# Patient Record
Sex: Female | Born: 1960 | Race: White | Hispanic: No | Marital: Married | Smoking: Never smoker
Health system: Southern US, Community
[De-identification: ages and names within clinical notes are randomized; demographics above are authoritative.]

## PROBLEM LIST (undated history)

## (undated) DIAGNOSIS — C50919 Malignant neoplasm of unspecified site of unspecified female breast: Secondary | ICD-10-CM

## (undated) DIAGNOSIS — I4891 Unspecified atrial fibrillation: Secondary | ICD-10-CM

## (undated) DIAGNOSIS — K2270 Barrett's esophagus without dysplasia: Secondary | ICD-10-CM

## (undated) DIAGNOSIS — K449 Diaphragmatic hernia without obstruction or gangrene: Secondary | ICD-10-CM

## (undated) DIAGNOSIS — F419 Anxiety disorder, unspecified: Secondary | ICD-10-CM

## (undated) DIAGNOSIS — K219 Gastro-esophageal reflux disease without esophagitis: Secondary | ICD-10-CM

## (undated) DIAGNOSIS — K222 Esophageal obstruction: Secondary | ICD-10-CM

## (undated) DIAGNOSIS — C50512 Malignant neoplasm of lower-outer quadrant of left female breast: Principal | ICD-10-CM

## (undated) DIAGNOSIS — S52502A Unspecified fracture of the lower end of left radius, initial encounter for closed fracture: Secondary | ICD-10-CM

## (undated) DIAGNOSIS — R05 Cough: Secondary | ICD-10-CM

## (undated) DIAGNOSIS — C439 Malignant melanoma of skin, unspecified: Secondary | ICD-10-CM

## (undated) DIAGNOSIS — T7840XA Allergy, unspecified, initial encounter: Secondary | ICD-10-CM

## (undated) DIAGNOSIS — K5730 Diverticulosis of large intestine without perforation or abscess without bleeding: Secondary | ICD-10-CM

## (undated) DIAGNOSIS — N390 Urinary tract infection, site not specified: Secondary | ICD-10-CM

## (undated) DIAGNOSIS — Z923 Personal history of irradiation: Secondary | ICD-10-CM

## (undated) HISTORY — DX: Diaphragmatic hernia without obstruction or gangrene: K44.9

## (undated) HISTORY — DX: Malignant melanoma of skin, unspecified: C43.9

## (undated) HISTORY — DX: Malignant neoplasm of lower-outer quadrant of left female breast: C50.512

## (undated) HISTORY — DX: Gastro-esophageal reflux disease without esophagitis: K21.9

## (undated) HISTORY — DX: Diverticulosis of large intestine without perforation or abscess without bleeding: K57.30

## (undated) HISTORY — PX: CHOLECYSTECTOMY: SHX55

## (undated) HISTORY — DX: Urinary tract infection, site not specified: N39.0

## (undated) HISTORY — DX: Allergy, unspecified, initial encounter: T78.40XA

## (undated) HISTORY — DX: Esophageal obstruction: K22.2

## (undated) HISTORY — DX: Malignant neoplasm of unspecified site of unspecified female breast: C50.919

## (undated) HISTORY — PX: MELANOMA EXCISION: SHX5266

## (undated) HISTORY — DX: Barrett's esophagus without dysplasia: K22.70

---

## 1998-08-11 ENCOUNTER — Other Ambulatory Visit: Admission: RE | Admit: 1998-08-11 | Discharge: 1998-08-11 | Payer: Self-pay | Admitting: Obstetrics and Gynecology

## 1999-09-07 ENCOUNTER — Other Ambulatory Visit: Admission: RE | Admit: 1999-09-07 | Discharge: 1999-09-07 | Payer: Self-pay | Admitting: Obstetrics and Gynecology

## 2000-11-02 ENCOUNTER — Other Ambulatory Visit: Admission: RE | Admit: 2000-11-02 | Discharge: 2000-11-02 | Payer: Self-pay | Admitting: Obstetrics and Gynecology

## 2001-12-10 ENCOUNTER — Other Ambulatory Visit: Admission: RE | Admit: 2001-12-10 | Discharge: 2001-12-10 | Payer: Self-pay | Admitting: Obstetrics and Gynecology

## 2002-01-08 ENCOUNTER — Encounter: Admission: RE | Admit: 2002-01-08 | Discharge: 2002-01-08 | Payer: Self-pay | Admitting: Dermatology

## 2002-01-08 ENCOUNTER — Encounter: Payer: Self-pay | Admitting: Dermatology

## 2002-12-12 ENCOUNTER — Other Ambulatory Visit: Admission: RE | Admit: 2002-12-12 | Discharge: 2002-12-12 | Payer: Self-pay | Admitting: Obstetrics and Gynecology

## 2003-04-28 ENCOUNTER — Encounter: Admission: RE | Admit: 2003-04-28 | Discharge: 2003-04-28 | Payer: Self-pay | Admitting: Internal Medicine

## 2003-04-28 ENCOUNTER — Encounter: Payer: Self-pay | Admitting: Internal Medicine

## 2004-01-21 ENCOUNTER — Other Ambulatory Visit: Admission: RE | Admit: 2004-01-21 | Discharge: 2004-01-21 | Payer: Self-pay | Admitting: Obstetrics and Gynecology

## 2005-01-26 ENCOUNTER — Other Ambulatory Visit: Admission: RE | Admit: 2005-01-26 | Discharge: 2005-01-26 | Payer: Self-pay | Admitting: Obstetrics and Gynecology

## 2005-05-11 ENCOUNTER — Emergency Department (HOSPITAL_COMMUNITY): Admission: EM | Admit: 2005-05-11 | Discharge: 2005-05-11 | Payer: Self-pay | Admitting: Emergency Medicine

## 2005-06-02 ENCOUNTER — Ambulatory Visit: Payer: Self-pay | Admitting: Internal Medicine

## 2006-01-31 ENCOUNTER — Other Ambulatory Visit: Admission: RE | Admit: 2006-01-31 | Discharge: 2006-01-31 | Payer: Self-pay | Admitting: Obstetrics and Gynecology

## 2006-03-16 ENCOUNTER — Ambulatory Visit: Payer: Self-pay | Admitting: Internal Medicine

## 2006-11-06 DIAGNOSIS — K2270 Barrett's esophagus without dysplasia: Secondary | ICD-10-CM

## 2006-11-06 HISTORY — DX: Barrett's esophagus without dysplasia: K22.70

## 2007-02-20 ENCOUNTER — Ambulatory Visit: Payer: Self-pay | Admitting: Internal Medicine

## 2007-02-26 ENCOUNTER — Ambulatory Visit: Payer: Self-pay | Admitting: Gastroenterology

## 2007-03-06 ENCOUNTER — Encounter: Payer: Self-pay | Admitting: Gastroenterology

## 2007-03-06 ENCOUNTER — Ambulatory Visit: Payer: Self-pay | Admitting: Gastroenterology

## 2007-03-12 ENCOUNTER — Ambulatory Visit: Payer: Self-pay | Admitting: Gastroenterology

## 2007-03-25 ENCOUNTER — Ambulatory Visit: Payer: Self-pay | Admitting: Gastroenterology

## 2007-03-25 HISTORY — PX: COLONOSCOPY: SHX174

## 2007-03-26 ENCOUNTER — Encounter: Admission: RE | Admit: 2007-03-26 | Discharge: 2007-03-26 | Payer: Self-pay | Admitting: Obstetrics and Gynecology

## 2007-07-16 ENCOUNTER — Encounter: Payer: Self-pay | Admitting: Internal Medicine

## 2007-07-16 DIAGNOSIS — Z9089 Acquired absence of other organs: Secondary | ICD-10-CM | POA: Insufficient documentation

## 2007-12-13 ENCOUNTER — Ambulatory Visit: Payer: Self-pay | Admitting: Internal Medicine

## 2007-12-13 DIAGNOSIS — Z85820 Personal history of malignant melanoma of skin: Secondary | ICD-10-CM | POA: Insufficient documentation

## 2007-12-13 DIAGNOSIS — K219 Gastro-esophageal reflux disease without esophagitis: Secondary | ICD-10-CM | POA: Insufficient documentation

## 2007-12-13 DIAGNOSIS — J019 Acute sinusitis, unspecified: Secondary | ICD-10-CM | POA: Insufficient documentation

## 2007-12-13 DIAGNOSIS — J309 Allergic rhinitis, unspecified: Secondary | ICD-10-CM | POA: Insufficient documentation

## 2007-12-13 DIAGNOSIS — K5730 Diverticulosis of large intestine without perforation or abscess without bleeding: Secondary | ICD-10-CM | POA: Insufficient documentation

## 2008-03-03 ENCOUNTER — Encounter: Admission: RE | Admit: 2008-03-03 | Discharge: 2008-03-03 | Payer: Self-pay | Admitting: Obstetrics and Gynecology

## 2008-03-12 ENCOUNTER — Telehealth: Payer: Self-pay | Admitting: Gastroenterology

## 2008-04-20 DIAGNOSIS — K222 Esophageal obstruction: Secondary | ICD-10-CM | POA: Insufficient documentation

## 2008-04-20 DIAGNOSIS — K449 Diaphragmatic hernia without obstruction or gangrene: Secondary | ICD-10-CM | POA: Insufficient documentation

## 2008-04-20 DIAGNOSIS — K2270 Barrett's esophagus without dysplasia: Secondary | ICD-10-CM | POA: Insufficient documentation

## 2008-04-21 ENCOUNTER — Ambulatory Visit: Payer: Self-pay | Admitting: Gastroenterology

## 2008-05-01 ENCOUNTER — Telehealth: Payer: Self-pay | Admitting: Internal Medicine

## 2009-02-09 ENCOUNTER — Encounter (INDEPENDENT_AMBULATORY_CARE_PROVIDER_SITE_OTHER): Payer: Self-pay | Admitting: *Deleted

## 2009-03-22 ENCOUNTER — Encounter: Admission: RE | Admit: 2009-03-22 | Discharge: 2009-03-22 | Payer: Self-pay | Admitting: Obstetrics and Gynecology

## 2009-09-03 ENCOUNTER — Telehealth: Payer: Self-pay | Admitting: Internal Medicine

## 2009-11-18 ENCOUNTER — Telehealth: Payer: Self-pay | Admitting: Gastroenterology

## 2009-11-29 ENCOUNTER — Encounter (INDEPENDENT_AMBULATORY_CARE_PROVIDER_SITE_OTHER): Payer: Self-pay

## 2009-11-30 ENCOUNTER — Telehealth: Payer: Self-pay | Admitting: Gastroenterology

## 2009-11-30 ENCOUNTER — Ambulatory Visit: Payer: Self-pay | Admitting: Gastroenterology

## 2009-12-08 ENCOUNTER — Ambulatory Visit: Payer: Self-pay | Admitting: Gastroenterology

## 2009-12-17 ENCOUNTER — Encounter: Payer: Self-pay | Admitting: Internal Medicine

## 2010-01-05 ENCOUNTER — Ambulatory Visit: Payer: Self-pay | Admitting: Internal Medicine

## 2010-01-05 DIAGNOSIS — M545 Low back pain: Secondary | ICD-10-CM

## 2010-01-05 DIAGNOSIS — M542 Cervicalgia: Secondary | ICD-10-CM | POA: Insufficient documentation

## 2010-02-04 ENCOUNTER — Ambulatory Visit: Payer: Self-pay | Admitting: Internal Medicine

## 2010-04-26 ENCOUNTER — Encounter: Admission: RE | Admit: 2010-04-26 | Discharge: 2010-04-26 | Payer: Self-pay | Admitting: Obstetrics and Gynecology

## 2010-12-06 NOTE — Progress Notes (Signed)
Summary: Schedule Colonoscopy   Phone Note Outgoing Call Call back at Home Phone [phone]   Call placed by: Harlow Mares CMA Duncan Dull),  November 18, 2009 2:17 PM Call placed to: Patient Summary of Call: Left message on patients machine to call back. patient needs a direct EGD to follow up on her Barretts.  Initial call taken by: Harlow Mares CMA Duncan Dull),  November 18, 2009 2:18 PM  Follow-up for Phone Call        patient scheduled for 12/08/2009 Follow-up by: Harlow Mares CMA Duncan Dull),  November 23, 2009 3:29 PM

## 2010-12-06 NOTE — Letter (Signed)
Summary: [facility]  [facility]   Imported By: Sherian Rein 01/24/2010 08:26:31  _____________________________________________________________________  External Attachment:    Type:   Image     Comment:   External Document
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
## 2010-12-06 NOTE — Progress Notes (Signed)
Summary: Pantroprazole   Phone Note Call from Patient Call back at Home Phone [phone] Call back at [phone] (cell)   Caller: Patient Call For: Dr. Jarold Motto Reason for Call: Refill Medication Summary of Call: needs refill of Pantoprazole 40mg ... [facility] [address] Initial call taken by: Vallarie Mare,  November 30, 2009 9:03 AM    Prescriptions: PROTONIX 40 MG  TBEC (PANTOPRAZOLE SODIUM) take on tab daily  #31 Tablet x 11   Entered by:   Ashok Cordia RN   Authorized by:   Mardella Layman MD [facility]   Signed by:   Ashok Cordia RN on 11/30/2009   Method used:   [facility]ronically to        [facility] [address] [id number]       [address]       [place], [place]  [address]       Ph: [phone] or [phone]       Fax: [phone]   RxID:   [id number]

## 2010-12-06 NOTE — Letter (Signed)
Summary: Recall Endoscopy Letter  [facility]  [address] [place], [place] [address]   Phone: [phone]  Fax: [phone]      February 09, 2009 MRN: [id number]   [facility] [address] [place], [place]  [address]   Dear Ms. Deblanc,   According to your medical record, it is time for you to schedule an Endoscopy. Endoscopic screening is recommended for patients with certain upper digestive tract conditions because of associated increased risk for cancers of the upper digestive system.  This letter has been generated based on the recommendations made at the time of your prior procedure. If you feel that in your particular situation this may no longer apply, please contact our office.  Please call our office at [phone]) to schedule this appointment or to update your records at your earliest convenience.  Thank you for cooperating with [place] to provide you with the very best care possible.   Sincerely,  Vania Rea. Jarold Motto, M.D.  [facility] [phone]

## 2010-12-06 NOTE — Miscellaneous (Signed)
Summary: Lec previsit  Clinical Lists Changes  Observations: Added new observation of ALLERGY REV: Done (11/30/2009 8:31)

## 2010-12-06 NOTE — Progress Notes (Signed)
Summary: REFILL  Medications Added PROTONIX 40 MG  TBEC (PANTOPRAZOLE SODIUM) take on tab daily       Phone Note Call from Patient Call back at Home Phone [phone]   Caller: Patient Reason for Call: Refill Medication Summary of Call: Wanda Moore PT DOWN TO 1WK SUPPLY OF PROTONIX.  HAS AN APPT SCHEDULED FOR 04-09-08 FOR MED RENEWAL, BUT NEEDS A REFILL [facility] UNTIL THEN.  PATIENT'S CHART HAS BEEN REQUESTED Initial call taken by: Magdalen Spatz [facility],  Mar 12, 2008 10:05 AM  Follow-up for Phone Call        RX sent for Protonix and [facility] to notify pt. Follow-up by: Harlow Mares CMA,  Mar 12, 2008 10:29 AM    New/Updated Medications: PROTONIX 40 MG  TBEC (PANTOPRAZOLE SODIUM) take on tab daily   Prescriptions: PROTONIX 40 MG  TBEC (PANTOPRAZOLE SODIUM) take on tab daily  #30 x 11   Entered by:   Harlow Mares CMA   Authorized by:   Mardella Layman MD   Signed by:   Harlow Mares CMA on 03/12/2008   Method used:   [facility] ...       [facility] [address]  [facility]       [address]       [place], [place]  [address]       Ph: [phone] or [phone]       Fax: [phone]   RxID:   [id number]

## 2010-12-06 NOTE — Letter (Signed)
Summary: EGD Instructions  [facility]  [address] [place], [place] [address]   Phone: [phone]  Fax: [phone]       ALEXYSS BALZARINI    1960-12-06    MRN: [id number]       Procedure Day Dorna Bloom: Wednesday 12/08/2009     Arrival Time:  9:00 am     Procedure Time: 10:00 am     Location of Procedure:                    _ x _ [facility] (4th Floor)   PREPARATION FOR ENDOSCOPY   On  Wednesday 2/2 THE DAY OF THE PROCEDURE:  1.   No solid foods, milk or milk products are allowed after midnight the night before your procedure.  2.   Do not drink anything colored red or purple.  Avoid juices with pulp.  No orange juice.  3.  You may drink clear liquids until 8:00 am, which is 2 hours before your procedure.                                                                                                CLEAR LIQUIDS INCLUDE: Water Jello Ice Popsicles Tea (sugar ok, no milk/cream) Powdered fruit flavored drinks Coffee (sugar ok, no milk/cream) Gatorade Juice: apple, white grape, white cranberry  Lemonade Clear bullion, consomm, broth Carbonated beverages (any kind) Strained chicken noodle soup Hard Candy   MEDICATION INSTRUCTIONS  Unless otherwise instructed, you should take regular prescription medications with a small sip of water as early as possible the morning of your procedure.             OTHER INSTRUCTIONS  You will need a responsible adult at least 51 years of age to accompany you and drive you home.   This person must remain in the waiting room during your procedure.  Wear loose fitting clothing that is easily removed.  Leave jewelry and other valuables at home.  However, you may wish to bring a book to read or an iPod/MP3 player to listen to music as you wait for your procedure to start.  Remove all body piercing jewelry and leave at home.  Total time from sign-in until discharge is approximately 2-3 hours.  You should  go home directly after your procedure and rest.  You can resume normal activities the day after your procedure.  The day of your procedure you should not:   Drive   Make legal decisions   Operate machinery   Drink alcohol   Return to work  You will receive specific instructions about eating, activities and medications before you leave.    The above instructions have been reviewed and explained to me by   Ulis Rias RN  November 30, 2009 8:51 AM     I fully understand and can verbalize these instructions _____________________________ Date _________

## 2010-12-06 NOTE — Progress Notes (Signed)
Summary: Swelling ankles/legs  Phone Note Call from Patient Call back at Work Phone [phone] Call back at [phone]   Caller: Patient Summary of Call: Patient continues to have swelling in ankles and legs.  Wants call-back. Initial call taken by: Leonette Monarch Site Manager,  May 01, 2008 10:10 AM  Follow-up for Phone Call        patient not seen since Feb '08. No h/o heart or renal disease. Reports many weeks of intermittent LE edema which improves overnight and recurrs during the day. NO SOB or cardiac symptoms.  Plan: elevate legs, use knee high or panty hose support stockings. referred to "[employer]" to research venous insufficiency. Lab: Comp panel, CBC, TSH-patient to come by. Follow-up by: Jacques Navy MD,  May 01, 2008 6:58 PM

## 2010-12-06 NOTE — Procedures (Signed)
Summary: Upper Endoscopy  Patient: Wanda Moore Note: All result statuses are Final unless otherwise noted.  Tests: (1) Upper Endoscopy (EGD)   EGD Upper Endoscopy       DONE     [facility]     [address]     [place], [place]  [address]           ENDOSCOPY PROCEDURE REPORT           PATIENT:  Wanda Moore, Wanda Moore  MR#:  [id number]     BIRTHDATE:  1960/11/10, 48 yrs. old  GENDER:  female           ENDOSCOPIST:  Vania Rea. Jarold Motto, MD, [facility]     Referred by:           PROCEDURE DATE:  12/08/2009     PROCEDURE:  EGD, diagnostic     ASA CLASS:  Class I     INDICATIONS:  Barrett's Esophagus, suspected           MEDICATIONS:   Fentanyl 75 mcg IV, Versed 8 mg IV     TOPICAL ANESTHETIC:  Exactacain Spray           DESCRIPTION OF PROCEDURE:   After the risks benefits and     alternatives of the procedure were thoroughly explained, informed     consent was obtained.  The LB GIF-H180 [id number] endoscope was     introduced through the mouth and advanced to the second portion of     the duodenum, without limitations.  The instrument was slowly     withdrawn as the mucosa was fully examined.     <<PROCEDUREIMAGES>>           A hiatal hernia was found. 3 cm. prolapsing HH noted.  Normal GE     junction was noted.  Normal duodenal folds were noted.  The     stomach was entered and closely examined. The antrum, angularis,     and lesser curvature were well visualized, including a retroflexed     view of the cardia and fundus. The stomach wall was normally     distensable. The scope passed easily through the pylorus into the     duodenum.    Retroflexed views revealed a hiatal hernia.    The     scope was then withdrawn from the patient and the procedure     completed.           COMPLICATIONS:  None           ENDOSCOPIC IMPRESSION:     1) Hiatal hernia     2) Normal GE junction     3) Normal duodenal folds     4) Normal stomach     5) A hiatal hernia     NO BARRETT'S  NOTED.NARROW BAND IMAGING DONE.DR.JACOBS REVIEWED     AND AGREES.CHRONIC UNDER GOOD CONTROL WITH DAILY PPI RX.     RECOMMENDATIONS:     1) continue PPI     F/U AS NEEDED.           REPEAT EXAM:  No           ______________________________     Vania Rea. Jarold Motto, MD, [name]           CC:  Wanda Navy, MD           n.     Wanda DoctorMarland Kitchen   Vania Rea. Brittinie Moore at 12/08/2009 10:14 AM  Wanda Moore, Wanda Moore, [id number]  Note: An exclamation mark (!) indicates a result that was not dispersed into the flowsheet. Document Creation Date: 12/08/2009 10:15 AM _______________________________________________________________________  (1) Order result status: Final Collection or observation date-time: 12/08/2009 10:04 Requested date-time:  Receipt date-time:  Reported date-time:  Referring Physician:   Ordering Physician: Sheryn Bison [id number]) Specimen Source:  Source: Wanda Moore Order Number: [id number] Lab site:

## 2011-03-03 ENCOUNTER — Other Ambulatory Visit: Payer: Self-pay | Admitting: Gastroenterology

## 2011-03-09 ENCOUNTER — Other Ambulatory Visit: Payer: Self-pay | Admitting: Gastroenterology

## 2011-03-09 MED ORDER — PANTOPRAZOLE SODIUM 40 MG PO TBEC: DELAYED_RELEASE_TABLET | ORAL | Status: DC

## 2011-03-09 NOTE — Telephone Encounter (Signed)
rx sent pt must keep office visit.  

## 2011-03-24 NOTE — Assessment & Plan Note (Signed)
[facility]                         GASTROENTEROLOGY OFFICE NOTE   NAME:Wanda Moore, Wanda Moore                     MRN:          [id number]  DATE:02/26/2007                            DOB:          [birth year]    REFERRING PHYSICIAN:  Rosalyn Gess. 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 Debby Bud for evaluation of dysphagia.   HISTORY OF PRESENT ILLNESS:  Wanda Moore has had intermittent solid food  dysphagia in her mid substernal area for the last 2 years usually with  large pieces of bread or meats. She has some positional changes that  will allow her to have the food pass and alleviate some associated  shortness of breath. She has never had true heartburn but has been on  Protonix on and off for the last couple of years. She says if she eats  late at night she will have some regurgitation symptoms. She has never  had endoscopy or barium studies of her bowels but has had ERCP some 16  years ago for retained common duct stone before a cholecystectomy.   With her dysphagia, she has had no anorexia, weight loss or any  hepatobiliary complaints. Her bowels move well and she denies melena or  hematochezia.   PAST MEDICAL HISTORY:  Otherwise fairly noncontributory except for a  cholecystectomy in 1992. She did have a cesarean section also in 1992.   MEDICATIONS:  None besides her Protonix. She has no drug allergies.   FAMILY HISTORY:  Remarkable for breast cancer in her sister, otherwise  noncontributory.   SOCIAL HISTORY:  She is married and lives with her husband and children.  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 She does not smoke or abuse alcohol.   REVIEW OF SYSTEMS:  Noncontributory. Her last menstrual period was February 14, 2007.  It is of note she currently apparently has a urinary tract  infection and is on Cipro for  this.   PHYSICAL EXAMINATION:  GENERAL:  Shows her to be an attractive,  pleasant, white female in no distress appearing her stated age.  VITAL SIGNS:  She is 6 feet tall and weighs 243 pounds. Blood pressure  is 122/80 and pulse was 72 and regular.  HEENT:  I could not appreciate stigmata of chronic liver disease. She  did have a palpable non-nodular thyroid.  CHEST:  Clear without wheezes or rhonchi.  HEART:  She appeared to be in a regular rhythm without significant  cardiac murmurs, gallops or rubs.  ABDOMEN:  There was no hepatosplenomegaly, abdominal masses or  tenderness noted.  EXTREMITIES:  Peripheral extremities were unremarkable.  NEUROLOGIC:  Mental status was normal.  RECTAL:  Deferred at this time.   ASSESSMENT:  1. Probable chronic acid reflux with peptic stricture esophagus versus      Schatzki's ring in her distal esophagus. I think her sensation of      food being Moore in her upper airway is referred from her lower      esophageal area.  2. Status post cholecystectomy and endoscopic sphincterotomy.  3. History of melanoma apparently removed from her back by Dr. Terri Piedra.      I do not have these records for review.  4. History of recurrent urinary tract infections.   RECOMMENDATIONS:  1. Continue reflux regime and Protonix therapy.  2. Outpatient endoscopy, probable esophageal dilatation. The risks and      benefits of this procedure has been explained in detail and she has      agreed to proceed as planned.  3. Would recommend colonoscopy exam especially for her age and history      of malignant melanoma.  4. Continue other medications and followup with Dr. Debby Bud as      previously planned.     Vania Rea. Jarold Motto, MD, Caleen Essex, FAGA  Electronically Signed    DRP/MedQ  DD: 02/26/2007  DT: 02/26/2007  Job #: [id number]   cc:   Rosalyn Gess. Norins, MD

## 2011-03-31 ENCOUNTER — Other Ambulatory Visit (INDEPENDENT_AMBULATORY_CARE_PROVIDER_SITE_OTHER): Payer: BC Managed Care – PPO

## 2011-03-31 ENCOUNTER — Encounter: Payer: Self-pay | Admitting: Gastroenterology

## 2011-03-31 ENCOUNTER — Ambulatory Visit (INDEPENDENT_AMBULATORY_CARE_PROVIDER_SITE_OTHER): Payer: BC Managed Care – PPO | Admitting: Gastroenterology

## 2011-03-31 VITALS — BP 130/78 | HR 76 | Ht 72.0 in | Wt 247.4 lb

## 2011-03-31 DIAGNOSIS — K219 Gastro-esophageal reflux disease without esophagitis: Secondary | ICD-10-CM

## 2011-03-31 DIAGNOSIS — K2270 Barrett's esophagus without dysplasia: Secondary | ICD-10-CM

## 2011-03-31 LAB — MAGNESIUM: Magnesium: 2.1 mg/dL (ref 1.5–2.5)

## 2011-03-31 LAB — HEPATIC FUNCTION PANEL
ALT: 32 U/L (ref 0–35)
AST: 26 U/L (ref 0–37)
Albumin: 3.6 g/dL (ref 3.5–5.2)
Alkaline Phosphatase: 62 U/L (ref 39–117)
Bilirubin, Direct: 0.1 mg/dL (ref 0.0–0.3)
Total Bilirubin: 0.4 mg/dL (ref 0.3–1.2)
Total Protein: 6.7 g/dL (ref 6.0–8.3)

## 2011-03-31 LAB — BASIC METABOLIC PANEL
BUN: 11 mg/dL (ref 6–23)
CO2: 28 mEq/L (ref 19–32)
Calcium: 9.1 mg/dL (ref 8.4–10.5)
Chloride: 105 mEq/L (ref 96–112)
Creatinine, Ser: 0.7 mg/dL (ref 0.4–1.2)
GFR: 97.03 mL/min (ref >60.00)
Glucose, Bld: 79 mg/dL (ref 70–99)
Potassium: 4.2 mEq/L (ref 3.5–5.1)
Sodium: 139 mEq/L (ref 135–145)

## 2011-03-31 LAB — CBC WITH DIFFERENTIAL/PLATELET
Basophils Absolute: 0 10*3/uL (ref 0.0–0.1)
Basophils Relative: 0.5 % (ref 0.0–3.0)
Eosinophils Absolute: 0.3 10*3/uL (ref 0.0–0.7)
Eosinophils Relative: 3.7 % (ref 0.0–5.0)
HCT: 34 % — ABNORMAL LOW (ref 36.0–46.0)
Hemoglobin: 11.9 g/dL — ABNORMAL LOW (ref 12.0–15.0)
Lymphocytes Relative: 36.8 % (ref 12.0–46.0)
Lymphs Abs: 2.6 10*3/uL (ref 0.7–4.0)
MCHC: 35.1 g/dL (ref 30.0–36.0)
MCV: 84.7 fl (ref 78.0–100.0)
Monocytes Absolute: 0.5 10*3/uL (ref 0.1–1.0)
Monocytes Relative: 7.3 % (ref 3.0–12.0)
Neutro Abs: 3.7 10*3/uL (ref 1.4–7.7)
Neutrophils Relative %: 51.7 % (ref 43.0–77.0)
Platelets: 234 10*3/uL (ref 150.0–400.0)
RBC: 4.02 Mil/uL (ref 3.87–5.11)
RDW: 12.8 % (ref 11.5–14.6)
WBC: 7.1 10*3/uL (ref 4.5–10.5)

## 2011-03-31 LAB — FOLATE: Folate: >24.8 ng/mL (ref >5.9)

## 2011-03-31 LAB — IBC PANEL
Iron: 74 ug/dL (ref 42–145)
Saturation Ratios: 15.4 % — ABNORMAL LOW (ref 20.0–50.0)
Transferrin: 343 mg/dL (ref 212.0–360.0)

## 2011-03-31 LAB — FERRITIN: Ferritin: 20.4 ng/mL (ref 10.0–291.0)

## 2011-03-31 LAB — TSH: TSH: 1.05 u[IU]/mL (ref 0.35–5.50)

## 2011-03-31 LAB — VITAMIN B12: Vitamin B-12: 338 pg/mL (ref 211–911)

## 2011-03-31 MED ORDER — PANTOPRAZOLE SODIUM 40 MG PO TBEC: 40.0000 mg | DELAYED_RELEASE_TABLET | Freq: Every day | ORAL | Status: DC

## 2011-03-31 NOTE — Progress Notes (Signed)
This is a very nice 50 year old Caucasian female with chronic acid reflux doing well on daily PPi therapy. She does have intermittent solid food dysphagia every several weeks, but had endoscopy approximately one year ago and was fairly unremarkable. There is no history of lower gastrointestinal complaints, and she had colonoscopy 5 years ago. There is no history of any food intolerances, or general medical problems except for postmenopausal symptoms and she is on daily hormonal therapy per her gynecologist.  Current Medications, Allergies, Past Medical History, Past Surgical History, Family History and Social History were reviewed in [name] record.  Pertinent Review of Systems Negative   Physical Exam: Awake and alert no acute distress appearing her stated age. There no stigmata of chronic liver disease, thyromegaly, or lymphadenopathy. Her chest is clear the cardiac exam is unremarkable with a normal rhythm. Abdominal exam shows no organomegaly, masses or tenderness. Bowel sounds are normal and peripheral extremities are unremarkable. Mental status is normal and there are no gross neurological deficits noted.    Assessment and Plan: Chronic gastroesophageal reflux disease doing well on daily Protonix 40 mg. I reviewed reflux regime with her, and will check screening labs not done a while. I advised her to take Caltrate 600 with vitamin D 2 tablets a day, and we will see her on a yearly basis. Review over last endoscopy showed no evidence of Barrett's mucosa. No diagnosis found.

## 2011-03-31 NOTE — Patient Instructions (Addendum)
Your prescription(s) have been sent to you pharmacy.  Please go to the basement today for your labs.  Start Caltrate 600 with vit d take one tablet by mouth twice a day you can buy this OTC.

## 2011-06-01 ENCOUNTER — Other Ambulatory Visit: Payer: Self-pay | Admitting: Obstetrics and Gynecology

## 2011-06-01 DIAGNOSIS — Z1231 Encounter for screening mammogram for malignant neoplasm of breast: Secondary | ICD-10-CM

## 2011-06-14 ENCOUNTER — Ambulatory Visit
Admission: RE | Admit: 2011-06-14 | Discharge: 2011-06-14 | Disposition: A | Discharge status: Home / Self Care | Payer: BC Managed Care – PPO | Source: Ambulatory Visit | Attending: Obstetrics and Gynecology | Admitting: Obstetrics and Gynecology

## 2011-06-14 DIAGNOSIS — Z1231 Encounter for screening mammogram for malignant neoplasm of breast: Secondary | ICD-10-CM

## 2011-06-20 ENCOUNTER — Other Ambulatory Visit: Payer: Self-pay | Admitting: Obstetrics and Gynecology

## 2011-06-20 DIAGNOSIS — R928 Other abnormal and inconclusive findings on diagnostic imaging of breast: Secondary | ICD-10-CM

## 2011-06-26 ENCOUNTER — Ambulatory Visit
Admission: RE | Admit: 2011-06-26 | Discharge: 2011-06-26 | Disposition: A | Discharge status: Home / Self Care | Payer: BC Managed Care – PPO | Source: Ambulatory Visit | Attending: Obstetrics and Gynecology | Admitting: Obstetrics and Gynecology

## 2011-06-26 DIAGNOSIS — R928 Other abnormal and inconclusive findings on diagnostic imaging of breast: Secondary | ICD-10-CM

## 2011-07-19 ENCOUNTER — Telehealth: Payer: Self-pay | Admitting: *Deleted

## 2011-07-19 NOTE — Telephone Encounter (Signed)
Pt advised to go to ER or UC ASAP for chest pains and mild tingling in LT arm. Pt agreed and will go now.

## 2011-07-19 NOTE — Telephone Encounter (Signed)
Pt left VM on Triage A this am c/o "catching" chest pains. No answer, left pt VM To call office and speak w/someone ASAP.

## 2011-10-04 ENCOUNTER — Other Ambulatory Visit: Payer: Self-pay | Admitting: Obstetrics and Gynecology

## 2011-11-07 DIAGNOSIS — K449 Diaphragmatic hernia without obstruction or gangrene: Secondary | ICD-10-CM

## 2011-11-07 DIAGNOSIS — K222 Esophageal obstruction: Secondary | ICD-10-CM

## 2011-11-07 HISTORY — DX: Diaphragmatic hernia without obstruction or gangrene: K44.9

## 2011-11-07 HISTORY — DX: Esophageal obstruction: K22.2

## 2012-04-02 ENCOUNTER — Encounter: Payer: Self-pay | Admitting: Gastroenterology

## 2012-04-02 ENCOUNTER — Ambulatory Visit (INDEPENDENT_AMBULATORY_CARE_PROVIDER_SITE_OTHER): Payer: BC Managed Care – PPO | Admitting: Gastroenterology

## 2012-04-02 VITALS — BP 122/68 | HR 76 | Ht 72.0 in | Wt 248.0 lb

## 2012-04-02 DIAGNOSIS — K219 Gastro-esophageal reflux disease without esophagitis: Secondary | ICD-10-CM

## 2012-04-02 DIAGNOSIS — K2270 Barrett's esophagus without dysplasia: Secondary | ICD-10-CM

## 2012-04-02 MED ORDER — DEXLANSOPRAZOLE 60 MG PO CPDR: 60.0000 mg | DELAYED_RELEASE_CAPSULE | Freq: Every day | ORAL | Status: DC

## 2012-04-02 NOTE — Progress Notes (Addendum)
This is a very nice 51 year old Caucasian female with chronic GERD. She had endoscopy and dilation of a peptic stricture 2 years ago. She is chronically on Protonix 40 mg a day, and denies significant acid reflux symptoms, but has had progressive solid food dysphagia over the last 2 months. The patient denies lower gastrointestinal, hepatobiliary, or general medical problems. She denies abuse of alcohol, cigarettes, or NSAIDs.  Current Medications, Allergies, Past Medical History, Past Surgical History, Family History and Social History were reviewed in [name] record.  Pertinent Review of Systems Negative... no history of Raynaud's syndrome, cardiovascular or pulmonary complaints. 10 point review of systems otherwise negative.   Physical Exam: Healthy-appearing patient in no distress. Blood pressure 122/68, pulse 76 and regular, weight 248 pounds, and BMI 33.63. I cannot appreciate stigmata of chronic liver disease. Her chest is entirely clear and she is in a regular rhythm without murmurs gallops or rubs. There is no organomegaly, abdominal masses or tenderness. Bowel sounds are normal. Examination the neck shows no lymphadenopathy or thyromegaly. Oral pharyngeal exam is unremarkable.    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 I have changed her from Protonix to Dexilant 60 mg before evening meal with strict antireflux maneuvers. Repeat endoscopy and esophageal dilatation scheduled her convenience. Risk and benefits of this procedure have been explained in detail, and she has agreed to proceed as planned. Encounter Diagnosis  Name Primary?  . Barrett's esophagus Yes

## 2012-04-02 NOTE — Patient Instructions (Signed)
Your procedure has been scheduled for 04/10/2012, please follow the seperate instructions.  Stop your Protonix and start Dexilant once a day 30 min before breakfast, samples given and a rx has been sent to your pharmacy.

## 2012-04-10 ENCOUNTER — Encounter: Payer: Self-pay | Admitting: Gastroenterology

## 2012-04-10 ENCOUNTER — Ambulatory Visit (AMBULATORY_SURGERY_CENTER): Payer: BC Managed Care – PPO | Admitting: Gastroenterology

## 2012-04-10 VITALS — BP 140/89 | HR 82 | Temp 98.0°F | Resp 20 | Ht 72.0 in | Wt 248.0 lb

## 2012-04-10 DIAGNOSIS — K219 Gastro-esophageal reflux disease without esophagitis: Secondary | ICD-10-CM

## 2012-04-10 DIAGNOSIS — K2270 Barrett's esophagus without dysplasia: Secondary | ICD-10-CM

## 2012-04-10 HISTORY — PX: ESOPHAGOGASTRODUODENOSCOPY: SHX1529

## 2012-04-10 MED ORDER — SODIUM CHLORIDE 0.9 % IV SOLN: 500.0000 mL | INTRAVENOUS | Status: DC

## 2012-04-10 NOTE — Patient Instructions (Signed)

## 2012-04-10 NOTE — Progress Notes (Signed)
Propofol per s camp crna. See scanned intra procedure report. ewm 
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
## 2012-04-10 NOTE — Op Note (Signed)
[facility] [address] [place], [place]  [address]  ENDOSCOPY PROCEDURE REPORT  PATIENT:  Wanda Moore, Wanda Moore  MR#:  [id number] BIRTHDATE:  1961/07/08, 50 yrs. old  GENDER:  female  ENDOSCOPIST:  Vania Rea. Jarold Motto, MD, [facility] Referred by:  PROCEDURE DATE:  04/10/2012 PROCEDURE:  [name] Dilation of Esophagus, EGD, diagnostic 43235 ASA CLASS:  Class II INDICATIONS:  GERD, dysphagia  MEDICATIONS:   propofol (Diprivan) 230 mg IV TOPICAL ANESTHETIC:  DESCRIPTION OF PROCEDURE:   After the risks and benefits of the procedure were explained, informed consent was obtained.  The LB-GIF Q180 [id number] endoscope was introduced through the mouth and advanced to the second portion of the duodenum.  The instrument was slowly withdrawn as the mucosa was fully examined. <<PROCEDUREIMAGES>>  A hiatal hernia was found. large proplapsing hh noted.see pictures A Schatzki's ring was found at the gastroesophageal junction. SEE PICTURES.DILATED #27F MALONEY DILATOR  Otherwise the examination was normal.    LARGE HIATIAL HERNIA.  The scope was then withdrawn from the patient and the procedure completed.  COMPLICATIONS:  None  ENDOSCOPIC IMPRESSION: 1) Hiatal hernia 2) Schatzki's ring at the gastroesophageal junction 3) Otherwise normal examination CHRONIC GERD,LARGE HH,PEPTIC STRICTURE DILATED RECOMMENDATIONS: 1) Clear liquids until, then soft foods rest iof day. Resume prior diet tomorrow. 2) continue PPI 3) dilatations PRN MAY NEED TO CONSIDER FUNDOPLICATION PER CLINICAL COURSE  ______________________________ Vania Rea. Jarold Motto, MD, [name]  CC:  Corwin Levins, MD  n. Rosalie DoctorMarland Kitchen   Vania Rea. Kamau Weatherall at 04/10/2012 03:01 PM  Harle Stanford, [phone]

## 2012-04-10 NOTE — Progress Notes (Signed)
Pt. C/o "very sore" throat.  Offered two ice chips, swallowed without difficulty.  Advised to call if soreness persists or  Dysphagia becomes a problem.

## 2012-04-11 ENCOUNTER — Telehealth: Payer: Self-pay | Admitting: *Deleted

## 2012-04-11 NOTE — Telephone Encounter (Signed)
  Follow up Call-  Call back number 04/10/2012  Post procedure Call Back phone  # [phone]  Permission to leave phone message Yes     Patient questions:  Do you have a fever, pain , or abdominal swelling? no Pain Score  0 *  Have you tolerated food without any problems? yes  Have you been able to return to your normal activities? yes  Do you have any questions about your discharge instructions: Diet   no Medications  no Follow up visit  no  Do you have questions or concerns about your Care? no  Actions: * If pain score is 4 or above: No action needed, pain <4.

## 2012-04-23 ENCOUNTER — Other Ambulatory Visit: Payer: Self-pay | Admitting: Gastroenterology

## 2012-04-23 MED ORDER — PANTOPRAZOLE SODIUM 40 MG PO TBEC: 40.0000 mg | DELAYED_RELEASE_TABLET | Freq: Every day | ORAL | Status: DC

## 2012-04-23 NOTE — Telephone Encounter (Signed)
I have spoken to Dr Jarold Motto regarding this patient. He states that it is okay for patient to switch back to Protonix. I have left a voicemail for patient with this information and have sent a prescription to the pharmacy for this.

## 2012-05-10 ENCOUNTER — Ambulatory Visit (INDEPENDENT_AMBULATORY_CARE_PROVIDER_SITE_OTHER): Payer: BC Managed Care – PPO | Admitting: Family Medicine

## 2012-05-10 VITALS — BP 134/78 | HR 74 | Temp 98.7°F | Resp 17 | Ht 73.0 in | Wt 248.0 lb

## 2012-05-10 DIAGNOSIS — R21 Rash and other nonspecific skin eruption: Secondary | ICD-10-CM

## 2012-05-10 MED ORDER — PREDNISONE 20 MG PO TABS: ORAL_TABLET | ORAL | Status: DC

## 2012-05-10 NOTE — Progress Notes (Signed)
This is a 51 year old woman who has a pruritic rash on her upper arms or forearms and on her proximal thighs. The rash spares her face and torso. She's had this for about 3 weeks and she's tried Benadryl and other creams without success. At first thought this was poison ivy but it never fasciculated.  Objective: Papular erythematous rash on both deltoid regions and right forearm as well as proximal thighs. No respiratory problems or oropharyngeal swelling  Assessment: Urticaria  Plan: Prednisone 40 mg daily x5

## 2012-05-10 NOTE — Patient Instructions (Addendum)
Hives Hives (urticaria) are itchy, red, swollen patches on the skin. They may change size, shape, and location quickly and repeatedly. Hives that occur deeper in the skin can cause swelling of the hands, feet, and face. Hives may be an allergic reaction to something you or your child ate, touched, or put on the skin. Hives can also be a reaction to cold, heat, viral infections, medication, insect bites, or emotional stress. Often the cause is hard to find. Hives can come and go for several days to several weeks. Hives are not contagious. HOME CARE INSTRUCTIONS   If the cause of the hives is known, avoid exposure to that source.   To relieve itching and rash:   Apply cold compresses to the skin or take cool water baths. Do not take or give your child hot baths or showers because the warmth will make the itching worse.   The best medicine for hives is an antihistamine. An antihistamine will not cure hives, but it will reduce their severity. You can use an antihistamine available over the counter. This medicine may make your child sleepy. Teenagers should not drive while using this medicine.   Take or give an antihistamine every 6 hours until the hives are completely gone for 24 hours or as directed.   Your child may have other medications prescribed for itching. Give these as directed by your child's caregiver.   You or your child should wear loose fitting clothing, including undergarments. Skin irritations may make hives worse.   Follow-up as directed by your caregiver.  SEEK MEDICAL CARE IF:   You or your child still have considerable itching after taking the medication (prescribed or purchased over the counter).   Joint swelling or pain occurs.  SEEK IMMEDIATE MEDICAL CARE IF:   You have a fever.   Swollen lips or tongue are noticed.   There is difficulty with breathing, swallowing, or tightness in the throat or chest.   Abdominal pain develops.   Your child starts acting very  sick.  These may be the first signs of a life-threatening allergic reaction. THIS IS AN EMERGENCY. Call 911 for medical help. MAKE SURE YOU:   Understand these instructions.   Will watch your condition.   Will get help right away if you are not doing well or get worse.  Document Released: 10/23/2005 Document Revised: 10/12/2011 Document Reviewed: 06/12/2008 ExitCare Patient Information 2012 [employer]. 

## 2012-05-20 ENCOUNTER — Telehealth: Payer: Self-pay | Admitting: Gastroenterology

## 2012-05-20 DIAGNOSIS — K449 Diaphragmatic hernia without obstruction or gangrene: Secondary | ICD-10-CM

## 2012-05-20 DIAGNOSIS — IMO0001 Reserved for inherently not codable concepts without codable children: Secondary | ICD-10-CM

## 2012-05-20 NOTE — Telephone Encounter (Signed)
lmom for pt to call back

## 2012-05-21 NOTE — Telephone Encounter (Signed)
lmom for pt to call back

## 2012-05-22 NOTE — Telephone Encounter (Signed)
Pt called back and states she still has upper epigastric pain or discomfort like the area is bruised. She is on protonix daily.  Dr Jarold Motto, per EGD with dilation on 04/10/12, you wrote pt may need to consider fundoplication d/t large  HH. Surgical Referral? Please advise. Thanks.

## 2012-05-22 NOTE — Telephone Encounter (Signed)
Informed pt of her appt with Dr Wenda Low to discuss possible fundoplication. She will see him 07/03/12. Dr Daphine Deutscher does not require a Manometry before OV, however, he does require an UGI which was ordered for 05/28/12. If a Manometry is needed, Dr Daphine Deutscher will order one at the OV. Dr Jarold Motto, Lorain Childes

## 2012-05-22 NOTE — Telephone Encounter (Signed)
YES.Marland KitchenREFER TO DR. MATT MARTIN...WILL NEED  MANOMETRY,BUT SEE HIM FIRST

## 2012-05-22 NOTE — Addendum Note (Signed)
Addended by: Florene Glen on: 05/22/2012 11:41 AM   Modules accepted: Orders

## 2012-05-23 ENCOUNTER — Ambulatory Visit: Payer: BC Managed Care – PPO | Admitting: Internal Medicine

## 2012-05-23 ENCOUNTER — Encounter: Payer: Self-pay | Admitting: Internal Medicine

## 2012-05-23 VITALS — BP 120/76 | HR 72 | Temp 98.6°F | Resp 16 | Ht 72.0 in | Wt 249.2 lb

## 2012-05-23 DIAGNOSIS — R21 Rash and other nonspecific skin eruption: Secondary | ICD-10-CM

## 2012-05-23 DIAGNOSIS — L299 Pruritus, unspecified: Secondary | ICD-10-CM

## 2012-05-23 LAB — POCT SKIN KOH: Skin KOH, POC: NEGATIVE

## 2012-05-23 MED ORDER — PREDNISONE 10 MG PO TABS: ORAL_TABLET | ORAL | Status: DC

## 2012-05-23 MED ORDER — CLOBETASOL PROP EMOLLIENT BASE 0.05 % EX CREA: 1.0000 "application " | TOPICAL_CREAM | Freq: Two times a day (BID) | CUTANEOUS | Status: DC

## 2012-05-23 NOTE — Progress Notes (Signed)
  Subjective:    Patient ID: Wanda Moore, female    DOB: 1960/12/08, 51 y.o.   MRN: [id number]  HPI Persistant rash, very itchy, on arms ,legs, and chest. Not sick, no fever, goes away with prednisone. Never had before, no family members with the rash.   Review of Systems gerd    Objective:   Physical Exam KOH done Results for orders placed in visit on 05/23/12  POCT SKIN KOH      Component Value Range   Skin KOH, POC Negative     Rash is red VS normal scattered irregular papules, somewhat symmetric       Assessment & Plan:  Contact dematitis Prednisone and clobetasol cream

## 2012-05-28 ENCOUNTER — Ambulatory Visit (HOSPITAL_COMMUNITY)
Admission: RE | Admit: 2012-05-28 | Discharge: 2012-05-28 | Disposition: A | Discharge status: Home / Self Care | Payer: BC Managed Care – PPO | Source: Ambulatory Visit | Attending: Gastroenterology | Admitting: Gastroenterology

## 2012-05-28 DIAGNOSIS — K449 Diaphragmatic hernia without obstruction or gangrene: Secondary | ICD-10-CM | POA: Insufficient documentation

## 2012-05-28 DIAGNOSIS — K219 Gastro-esophageal reflux disease without esophagitis: Secondary | ICD-10-CM | POA: Insufficient documentation

## 2012-06-03 ENCOUNTER — Telehealth: Payer: Self-pay | Admitting: Gastroenterology

## 2012-06-03 NOTE — Telephone Encounter (Signed)
Discussed the results of the UGI with the patient.  She is asked to keep the surgical referral.  She will call back for any additional questions or problems

## 2012-06-21 ENCOUNTER — Other Ambulatory Visit: Payer: Self-pay | Admitting: Dermatology

## 2012-07-03 ENCOUNTER — Encounter (INDEPENDENT_AMBULATORY_CARE_PROVIDER_SITE_OTHER): Payer: Self-pay | Admitting: Surgery

## 2012-07-03 ENCOUNTER — Ambulatory Visit (INDEPENDENT_AMBULATORY_CARE_PROVIDER_SITE_OTHER): Payer: BC Managed Care – PPO | Admitting: Surgery

## 2012-07-03 ENCOUNTER — Other Ambulatory Visit (INDEPENDENT_AMBULATORY_CARE_PROVIDER_SITE_OTHER): Payer: Self-pay | Admitting: Surgery

## 2012-07-03 VITALS — BP 158/84 | HR 80 | Temp 97.2°F | Resp 16 | Ht 72.0 in | Wt 253.6 lb

## 2012-07-03 DIAGNOSIS — K449 Diaphragmatic hernia without obstruction or gangrene: Secondary | ICD-10-CM

## 2012-07-03 DIAGNOSIS — K219 Gastro-esophageal reflux disease without esophagitis: Secondary | ICD-10-CM

## 2012-07-03 NOTE — Progress Notes (Signed)
Chief Complaint:  GERD, Barrett's esophagus  History of Present Illness:  Wanda Moore is an 51 y.o. female referred by Dr. Jarold Motto with GER since 2008 and history of multiple EGDs and one dilation.  Has hiatus hernia and GER.    I reviewed her upper GI series that showed a small hiatal hernia free reflux. She has had an incurred but no nocturnal aspiration or asthma. I went over the operation in detail gave her a booklet on this and fundoplication. She and her husband after some deliberation decided to admit forward with surgery. She seems aware of the risk and benefits of the procedure. We will schedule this procedure at [facility] long period      Past Medical History  Diagnosis Date  . Esophageal reflux   . Diverticulosis of colon (without mention of hemorrhage)   . Recurrent UTI   . Melanoma     removed from betwen shoulder blades  . Barrett esophagus   . Hiatal hernia   . Lumbago   . Stricture and stenosis of esophagus     Past Surgical History  Procedure Date  . Cholecystectomy   . Cesarean section   . Melanoma excision     back    Current Outpatient Prescriptions  Medication Sig Dispense Refill  . Clobetasol Prop Emollient Base 0.05 % emollient cream Apply 1 application topically 2 (two) times daily. Do not use on face or genitals  60 g  1  . estradiol-norethindrone (ACTIVELLA) 1-0.5 MG per tablet Take 1 tablet by mouth daily.      . pantoprazole (PROTONIX) 40 MG tablet Take 1 tablet (40 mg total) by mouth daily.  30 tablet  10   Review of patient's allergies indicates no known allergies. Family History  Problem Relation Age of Onset  . Breast cancer Sister   . Cancer Sister     breast (half sister - same mother)  . Hypertension Mother   . Diabetes Mother   . Heart disease Mother   . Lung cancer Father   . Cancer Father     lung  . Colon cancer Neg Hx    Social History:   reports that she has never smoked. She has never used smokeless tobacco. She reports  that she drinks alcohol. She reports that she does not use illicit drugs.   REVIEW OF SYSTEMS - PERTINENT POSITIVES ONLY: No DVT  Physical Exam:   Blood pressure 158/84, pulse 80, temperature 97.2 F (36.2 C), temperature source Temporal, resp. rate 16, height 6' (1.829 m), weight 253 lb 9.6 oz (115.032 kg). Body mass index is 34.39 kg/(m^2).  Gen:  WDWN WF NAD  Neurological: Alert and oriented to person, place, and time. Motor and sensory function is grossly intact  Head: Normocephalic and atraumatic.  Eyes: Conjunctivae are normal. Pupils are equal, round, and reactive to light. No scleral icterus.  Neck: Normal range of motion. Neck supple. No tracheal deviation or thyromegaly present.  Cardiovascular:  SR without murmurs or gallops.  No carotid bruits Respiratory: Effort normal.  No respiratory distress. No chest wall tenderness. Breath sounds normal.  No wheezes, rales or rhonchi.  Abdomen:  Lap scars healed GU: Musculoskeletal: Normal range of motion. Extremities are nontender. No cyanosis, edema or clubbing noted Lymphadenopathy: No cervical, preauricular, postauricular or axillary adenopathy is present Skin: Skin is warm and dry. No rash noted. No diaphoresis. No erythema. No pallor. Pscyh: Normal mood and affect. Behavior is normal. Judgment and thought content normal.  LABORATORY RESULTS: No results found for this or any previous visit (from the past 48 hour(s)).  RADIOLOGY RESULTS: No results found.  Problem List: Patient Active Problem List  Diagnosis  . SINUSITIS- ACUTE-NOS  . ALLERGIC RHINITIS  . ESOPHAGEAL STRICTURE  . GERD  . BARRETTS ESOPHAGUS  . HIATAL HERNIA  . DIVERTICULOSIS, COLON  . NECK PAIN  . LOW BACK PAIN, ACUTE  . MELANOMA, TRUNK, HX OF  . CHOLECYSTECTOMY, HX OF    Assessment & Plan: GERD and Barrett's with hiatus hernia   Lap Nissen fundoplicaiton    Matt B. Daphine Deutscher, MD, [facility] [phone]  beeper [phone]  07/03/2012 3:58 PM

## 2012-07-03 NOTE — Patient Instructions (Addendum)
Call and notify us to schedule surgery.  Speak with surgical scheduler. [phone]   Thanks for your patience.  If you need further assistance after leaving the office, please call our office and speak with a [facility] nurse.  [phone].  If you want to leave a message for Dr. Daphine Deutscher, please call his office phone at [phone].  Nissen Fundoplication Care After Please read the instructions outlined below and refer to this sheet for the next few weeks. These discharge instructions provide you with general information on caring for yourself after you leave the hospital. Your doctor may also give you specific instructions. While your treatment has been planned according to the most current medical practices available, unavoidable complications sometimes happen. If you have any problems or questions after discharge, please call your doctor. ACTIVITY  Take frequent rest periods throughout the day.   Take frequent walks throughout the day. This will help to prevent blood clots.   Continue to do your coughing and deep breathing exercises once you get home. This will help to prevent pneumonia.   No strenuous activities such as heavy lifting, pushing or pulling until after your follow-up visit with your doctor. Do not lift anything heavier than 10 pounds.   Talk with your caregiver about when you may return to work and your exercise routine.   You may shower 2 days after surgery. Pat incisions dry. Do not rub incisions with washcloth or towel.   Do not drive while taking prescription pain medication.  NUTRITION  Continue with a liquid diet, or the diet you were directed to take, until your first follow-up visit with your surgeon.   Drink fluids (6-8 glasses a day).   Call your caregiver for persistent nausea (feeling sick to your stomach), vomiting, bloating or difficulty swallowing.  ELIMINATION It is very important not to strain during bowel movements. If constipation should occur, you  may:  Take a mild laxative (such as Milk of Magnesia).   Add fruit and bran to your diet.   Drink more fluids.   Call your caregiver if constipation is not relieved.  FEVER If you feel feverish or have shaking chills, take your temperature. If it is 102 F (38.9 C) or above, call your caregiver. The fever may mean there is an infection. PAIN CONTROL  If a prescription was given for a pain reliever, please follow your caregiver's directions.   Only take over-the-counter or prescription medicines for pain, discomfort, or fever as directed by your caregiver.   If the pain is not relieved by your medicine, becomes worse, or you have difficulty breathing, call your doctor.  INCISION  It is normal for your cuts (incisions) from surgery to have a small amount of drainage for the first 1-2 days. Once the drainage has stopped, leave your incision(s) open to air.   Check your incision(s) and surrounding area daily for any redness, swelling, increased drainage or bleeding. If any of these are present or if the wound edges start to separate, call your doctor.   If you have small adhesive strips in place, they will peel and fall off. (If these strips are covered with a clear bandage, your doctor will tell you when to remove them.)   If you have staples, your caregiver will remove them at the follow-up appointment.  Document Released: 06/15/2004 Document Revised: 10/12/2011 Document Reviewed: 09/19/2007 [facility] Patient Information 2012 [place], [place].

## 2012-07-09 ENCOUNTER — Other Ambulatory Visit: Payer: Self-pay | Admitting: Obstetrics and Gynecology

## 2012-07-09 DIAGNOSIS — Z1231 Encounter for screening mammogram for malignant neoplasm of breast: Secondary | ICD-10-CM

## 2012-07-29 ENCOUNTER — Telehealth (INDEPENDENT_AMBULATORY_CARE_PROVIDER_SITE_OTHER): Payer: Self-pay | Admitting: General Surgery

## 2012-07-29 ENCOUNTER — Encounter (HOSPITAL_COMMUNITY): Payer: Self-pay | Admitting: Pharmacy Technician

## 2012-07-29 NOTE — Telephone Encounter (Signed)
Pt called to go over some general questions she had pre-op to her Nissen, concerning pain control, what to expect postoperatively at home, and return to work.  Each question answered to her satisfaction.

## 2012-07-31 ENCOUNTER — Ambulatory Visit
Admission: RE | Admit: 2012-07-31 | Discharge: 2012-07-31 | Disposition: A | Discharge status: Home / Self Care | Payer: BC Managed Care – PPO | Source: Ambulatory Visit | Attending: Obstetrics and Gynecology | Admitting: Obstetrics and Gynecology

## 2012-07-31 DIAGNOSIS — Z1231 Encounter for screening mammogram for malignant neoplasm of breast: Secondary | ICD-10-CM

## 2012-08-02 NOTE — Patient Instructions (Addendum)
20 SAXON CROSBY  08/02/2012   Your procedure is scheduled on:  08-09-2012  Report to [facility] at 0730  AM.  Call this number if you have problems the morning of surgery: [phone]   Remember:   Do not eat food or drink liquids:After Midnight.  .  Take these medicines the morning of surgery with A SIP OF WATER: protonix   Do not wear jewelry or make up.  Do not wear lotions, powders, or perfumes. You may wear deodorant.    Do not bring valuables to the hospital.  Contacts, dentures or bridgework may not be worn into surgery.  Leave suitcase in the car. After surgery it may be brought to your room.  For patients admitted to the hospital, checkout time is 11:00 AM the day of discharge                             Patients discharged the day of surgery will not be allowed to drive home. If going home same day of surgery, you must have someone stay with you the first 24 hours at home and arrange for some one to drive you home from hospital.    Special Instructions: See [facility] Preparing for Surgery instruction sheet. Women do not shave legs or underarms for 12 hours before showers. Men may shave face morning of surgery.    Please read over the following fact sheets that you were given: MRSA Information  C[facility] pre op nurse phone number [phone], call if needed

## 2012-08-03 DIAGNOSIS — R059 Cough, unspecified: Secondary | ICD-10-CM

## 2012-08-03 HISTORY — DX: Cough, unspecified: R05.9

## 2012-08-05 ENCOUNTER — Encounter (HOSPITAL_COMMUNITY)
Admission: RE | Admit: 2012-08-05 | Discharge: 2012-08-05 | Disposition: A | Discharge status: Home / Self Care | Payer: BC Managed Care – PPO | Source: Ambulatory Visit | Attending: Surgery | Admitting: Surgery

## 2012-08-05 ENCOUNTER — Ambulatory Visit (HOSPITAL_COMMUNITY)
Admission: RE | Admit: 2012-08-05 | Discharge: 2012-08-05 | Disposition: A | Discharge status: Home / Self Care | Payer: BC Managed Care – PPO | Source: Ambulatory Visit | Attending: Surgery | Admitting: Surgery

## 2012-08-05 ENCOUNTER — Encounter (HOSPITAL_COMMUNITY): Payer: Self-pay

## 2012-08-05 DIAGNOSIS — K449 Diaphragmatic hernia without obstruction or gangrene: Secondary | ICD-10-CM | POA: Insufficient documentation

## 2012-08-05 DIAGNOSIS — Z01818 Encounter for other preprocedural examination: Secondary | ICD-10-CM | POA: Insufficient documentation

## 2012-08-05 DIAGNOSIS — Z01812 Encounter for preprocedural laboratory examination: Secondary | ICD-10-CM | POA: Insufficient documentation

## 2012-08-05 HISTORY — DX: Cough: R05

## 2012-08-05 LAB — CBC
HCT: 33.7 % — ABNORMAL LOW (ref 36.0–46.0)
Hemoglobin: 11.4 g/dL — ABNORMAL LOW (ref 12.0–15.0)
MCH: 27.3 pg (ref 26.0–34.0)
MCHC: 33.8 g/dL (ref 30.0–36.0)
MCV: 80.8 fL (ref 78.0–100.0)
Platelets: 250 10*3/uL (ref 150–400)
RBC: 4.17 MIL/uL (ref 3.87–5.11)
RDW: 13.4 % (ref 11.5–15.5)
WBC: 10 10*3/uL (ref 4.0–10.5)

## 2012-08-05 LAB — SURGICAL PCR SCREEN
MRSA, PCR: NEGATIVE
Staphylococcus aureus: NEGATIVE

## 2012-08-08 NOTE — H&P (Signed)
Chief Complaint: GERD, Barrett's esophagus  History of Present Illness: Wanda Moore is an 51 y.o. female referred by Dr. Jarold Motto with GER since 2008 and history of multiple EGDs and one dilation. She has a  hiatus hernia and GER.  I reviewed her upper GI series that showed a small hiatal hernia and  free reflux. She has had  no nocturnal aspiration or asthma. I went over the operation in detail and gave her a booklet on this and fundoplication. She and her husband after some deliberation decided to movet forward with surgery. She seems aware of the risk and benefits of the procedure. She is scheduled to have surgery at [facility].   Past Medical History   Diagnosis  Date   .  Esophageal reflux    .  Diverticulosis of colon (without mention of hemorrhage)    .  Recurrent UTI    .  Melanoma      removed from betwen shoulder blades   .  Barrett esophagus    .  Hiatal hernia    .  Lumbago    .  Stricture and stenosis of esophagus     Past Surgical History   Procedure  Date   .  Cholecystectomy    .  Cesarean section    .  Melanoma excision      back    Current Outpatient Prescriptions   Medication  Sig  Dispense  Refill   .  Clobetasol Prop Emollient Base 0.05 % emollient cream  Apply 1 application topically 2 (two) times daily. Do not use on face or genitals  60 g  1   .  estradiol-norethindrone (ACTIVELLA) 1-0.5 MG per tablet  Take 1 tablet by mouth daily.     .  pantoprazole (PROTONIX) 40 MG tablet  Take 1 tablet (40 mg total) by mouth daily.  30 tablet  10   Review of patient's allergies indicates no known allergies.  Family History   Problem  Relation  Age of Onset   .  Breast cancer  Sister    .  Cancer  Sister       breast (half sister - same mother)    .  Hypertension  Mother    .  Diabetes  Mother    .  Heart disease  Mother    .  Lung cancer  Father    .  Cancer  Father       lung    .  Colon cancer  Neg Hx    Social History: reports that she has never smoked. She has  never used smokeless tobacco. She reports that she drinks alcohol. She reports that she does not use illicit drugs.  REVIEW OF SYSTEMS - PERTINENT POSITIVES ONLY:  No DVT  Physical Exam:  Blood pressure 158/84, pulse 80, temperature 97.2 F (36.2 C), temperature source Temporal, resp. rate 16, height 6' (1.829 m), weight 253 lb 9.6 oz (115.032 kg).  Body mass index is 34.39 kg/(m^2).  Gen: WDWN WF NAD  Neurological: Alert and oriented to person, place, and time. Motor and sensory function is grossly intact  Head: Normocephalic and atraumatic.  Eyes: Conjunctivae are normal. Pupils are equal, round, and reactive to light. No scleral icterus.  Neck: Normal range of motion. Neck supple. No tracheal deviation or thyromegaly present.  Cardiovascular: SR without murmurs or gallops. No carotid bruits  Respiratory: Effort normal. No respiratory distress. No chest wall tenderness. Breath sounds normal. No wheezes, rales or  rhonchi.  Abdomen: Lap scars healed  GU:  Musculoskeletal: Normal range of motion. Extremities are nontender. No cyanosis, edema or clubbing noted Lymphadenopathy: No cervical, preauricular, postauricular or axillary adenopathy is present Skin: Skin is warm and dry. No rash noted. No diaphoresis. No erythema. No pallor. Pscyh: Normal mood and affect. Behavior is normal. Judgment and thought content normal.  LABORATORY RESULTS:  No results found for this or any previous visit (from the past 48 hour(s)).  RADIOLOGY RESULTS:  No results found.  Problem List:  Patient Active Problem List   Diagnosis   .  SINUSITIS- ACUTE-NOS   .  ALLERGIC RHINITIS   .  ESOPHAGEAL STRICTURE   .  GERD   .  BARRETTS ESOPHAGUS   .  HIATAL HERNIA   .  DIVERTICULOSIS, COLON   .  NECK PAIN   .  LOW BACK PAIN, ACUTE   .  MELANOMA, TRUNK, HX OF   .  CHOLECYSTECTOMY, HX OF   Assessment & Plan:  GERD and Barrett's with hiatus hernia  Lap Nissen fundoplicaiton and repair of hiatal hernia.   Matt B.  Daphine Deutscher, MD, [facility]  [phone] beeper  [phone]

## 2012-08-09 ENCOUNTER — Encounter (HOSPITAL_COMMUNITY)
Admission: RE | Disposition: A | Discharge status: Home / Self Care | Payer: Self-pay | Source: Ambulatory Visit | Attending: Surgery

## 2012-08-09 ENCOUNTER — Encounter (HOSPITAL_COMMUNITY): Payer: Self-pay | Admitting: Anesthesiology

## 2012-08-09 ENCOUNTER — Encounter (HOSPITAL_COMMUNITY): Payer: Self-pay | Admitting: Orthopedic Surgery

## 2012-08-09 ENCOUNTER — Ambulatory Visit (HOSPITAL_COMMUNITY): Payer: BC Managed Care – PPO | Admitting: Anesthesiology

## 2012-08-09 ENCOUNTER — Inpatient Hospital Stay (HOSPITAL_COMMUNITY)
Admission: RE | Admit: 2012-08-09 | Discharge: 2012-08-12 | DRG: 154 | Disposition: A | Discharge status: Home / Self Care | Payer: BC Managed Care – PPO | Source: Ambulatory Visit | Attending: Surgery | Admitting: Surgery

## 2012-08-09 ENCOUNTER — Encounter (HOSPITAL_COMMUNITY): Payer: Self-pay | Admitting: *Deleted

## 2012-08-09 DIAGNOSIS — J309 Allergic rhinitis, unspecified: Secondary | ICD-10-CM | POA: Diagnosis present

## 2012-08-09 DIAGNOSIS — Z79899 Other long term (current) drug therapy: Secondary | ICD-10-CM

## 2012-08-09 DIAGNOSIS — K5730 Diverticulosis of large intestine without perforation or abscess without bleeding: Secondary | ICD-10-CM | POA: Diagnosis present

## 2012-08-09 DIAGNOSIS — Z85820 Personal history of malignant melanoma of skin: Secondary | ICD-10-CM

## 2012-08-09 DIAGNOSIS — Z9089 Acquired absence of other organs: Secondary | ICD-10-CM

## 2012-08-09 DIAGNOSIS — K449 Diaphragmatic hernia without obstruction or gangrene: Principal | ICD-10-CM | POA: Diagnosis present

## 2012-08-09 DIAGNOSIS — K222 Esophageal obstruction: Secondary | ICD-10-CM | POA: Diagnosis present

## 2012-08-09 DIAGNOSIS — K2270 Barrett's esophagus without dysplasia: Secondary | ICD-10-CM | POA: Diagnosis present

## 2012-08-09 DIAGNOSIS — Z23 Encounter for immunization: Secondary | ICD-10-CM

## 2012-08-09 DIAGNOSIS — M545 Low back pain, unspecified: Secondary | ICD-10-CM | POA: Diagnosis present

## 2012-08-09 DIAGNOSIS — M542 Cervicalgia: Secondary | ICD-10-CM | POA: Diagnosis present

## 2012-08-09 DIAGNOSIS — K219 Gastro-esophageal reflux disease without esophagitis: Secondary | ICD-10-CM | POA: Diagnosis present

## 2012-08-09 DIAGNOSIS — J019 Acute sinusitis, unspecified: Secondary | ICD-10-CM | POA: Diagnosis present

## 2012-08-09 HISTORY — PX: LAPAROSCOPIC NISSEN FUNDOPLICATION: SHX1932

## 2012-08-09 LAB — CBC
HCT: 33.3 % — ABNORMAL LOW (ref 36.0–46.0)
Hemoglobin: 11.3 g/dL — ABNORMAL LOW (ref 12.0–15.0)
MCH: 27.3 pg (ref 26.0–34.0)
MCHC: 33.9 g/dL (ref 30.0–36.0)
MCV: 80.4 fL (ref 78.0–100.0)
Platelets: 236 10*3/uL (ref 150–400)
RBC: 4.14 MIL/uL (ref 3.87–5.11)
RDW: 13.2 % (ref 11.5–15.5)
WBC: 13.5 10*3/uL — ABNORMAL HIGH (ref 4.0–10.5)

## 2012-08-09 LAB — CREATININE, SERUM
Creatinine, Ser: 0.65 mg/dL (ref 0.50–1.10)
GFR calc Af Amer: >90 mL/min (ref >90)
GFR calc non Af Amer: >90 mL/min (ref >90)

## 2012-08-09 SURGERY — FUNDOPLICATION, NISSEN, LAPAROSCOPIC
Anesthesia: General | Site: Abdomen | Wound class: Clean

## 2012-08-09 MED ORDER — PROPOFOL 10 MG/ML IV BOLUS
INTRAVENOUS | Status: DC | PRN
  Administered 2012-08-09: 200 mg via INTRAVENOUS

## 2012-08-09 MED ORDER — HEPARIN SODIUM (PORCINE) 5000 UNIT/ML IJ SOLN
INTRAMUSCULAR | Status: AC
  Administered 2012-08-09: 5000 [IU] via SUBCUTANEOUS
  Filled 2012-08-09: qty 1

## 2012-08-09 MED ORDER — 0.9 % SODIUM CHLORIDE (POUR BTL) OPTIME
TOPICAL | Status: DC | PRN
  Administered 2012-08-09: 1000 mL

## 2012-08-09 MED ORDER — FENTANYL CITRATE 0.05 MG/ML IJ SOLN
25.0000 ug | INTRAMUSCULAR | Status: DC | PRN
  Administered 2012-08-09: 50 ug via INTRAVENOUS

## 2012-08-09 MED ORDER — ACETAMINOPHEN 10 MG/ML IV SOLN
INTRAVENOUS | Status: DC | PRN
  Administered 2012-08-09: 1000 mg via INTRAVENOUS

## 2012-08-09 MED ORDER — KCL IN DEXTROSE-NACL 20-5-0.45 MEQ/L-%-% IV SOLN
INTRAVENOUS | Status: DC
  Administered 2012-08-09 – 2012-08-10 (×3): via INTRAVENOUS
  Administered 2012-08-10: 75 mL/h via INTRAVENOUS
  Administered 2012-08-11 – 2012-08-12 (×3): via INTRAVENOUS
  Filled 2012-08-09 (×6): qty 1000

## 2012-08-09 MED ORDER — KCL IN DEXTROSE-NACL 20-5-0.45 MEQ/L-%-% IV SOLN
INTRAVENOUS | Status: AC
  Filled 2012-08-09: qty 1000

## 2012-08-09 MED ORDER — ONDANSETRON HCL 4 MG/2ML IJ SOLN
INTRAMUSCULAR | Status: DC | PRN
  Administered 2012-08-09: 4 mg via INTRAVENOUS

## 2012-08-09 MED ORDER — NEOSTIGMINE METHYLSULFATE 1 MG/ML IJ SOLN
INTRAMUSCULAR | Status: DC | PRN
  Administered 2012-08-09: 4 mg via INTRAVENOUS

## 2012-08-09 MED ORDER — PROMETHAZINE HCL 25 MG/ML IJ SOLN
6.2500 mg | INTRAMUSCULAR | Status: DC | PRN
  Administered 2012-08-09: 6.25 mg via INTRAVENOUS

## 2012-08-09 MED ORDER — FENTANYL CITRATE 0.05 MG/ML IJ SOLN
INTRAMUSCULAR | Status: AC
  Filled 2012-08-09: qty 2

## 2012-08-09 MED ORDER — MIDAZOLAM HCL 5 MG/5ML IJ SOLN
INTRAMUSCULAR | Status: DC | PRN
  Administered 2012-08-09: 2 mg via INTRAVENOUS

## 2012-08-09 MED ORDER — DEXTROSE 5 % IV SOLN
2.0000 g | INTRAVENOUS | Status: AC
  Administered 2012-08-09: 2 g via INTRAVENOUS
  Filled 2012-08-09: qty 2

## 2012-08-09 MED ORDER — OXYCODONE-ACETAMINOPHEN 5-325 MG/5ML PO SOLN
5.0000 mL | ORAL | Status: DC | PRN
  Administered 2012-08-10: 5 mL via ORAL
  Administered 2012-08-10 – 2012-08-11 (×3): 10 mL via ORAL
  Administered 2012-08-11 (×2): 5 mL via ORAL
  Administered 2012-08-11 (×2): 10 mL via ORAL
  Administered 2012-08-11 – 2012-08-12 (×2): 5 mL via ORAL
  Administered 2012-08-12: 10 mL via ORAL
  Administered 2012-08-12: 5 mL via ORAL
  Administered 2012-08-12: 10 mL via ORAL
  Filled 2012-08-09: qty 5
  Filled 2012-08-09 (×4): qty 10
  Filled 2012-08-09 (×2): qty 5
  Filled 2012-08-09 (×3): qty 10
  Filled 2012-08-09 (×6): qty 5

## 2012-08-09 MED ORDER — ACETAMINOPHEN 10 MG/ML IV SOLN
INTRAVENOUS | Status: AC
  Filled 2012-08-09: qty 100

## 2012-08-09 MED ORDER — LACTATED RINGERS IV SOLN
INTRAVENOUS | Status: DC
  Administered 2012-08-09: 1000 mL via INTRAVENOUS

## 2012-08-09 MED ORDER — HEPARIN SODIUM (PORCINE) 5000 UNIT/ML IJ SOLN
5000.0000 [IU] | Freq: Once | INTRAMUSCULAR | Status: AC
  Administered 2012-08-09: 5000 [IU] via SUBCUTANEOUS

## 2012-08-09 MED ORDER — INFLUENZA VIRUS VACC SPLIT PF IM SUSP
0.5000 mL | INTRAMUSCULAR | Status: AC
  Administered 2012-08-10: 0.5 mL via INTRAMUSCULAR
  Filled 2012-08-09: qty 0.5

## 2012-08-09 MED ORDER — GLYCOPYRROLATE 0.2 MG/ML IJ SOLN
INTRAMUSCULAR | Status: DC | PRN
  Administered 2012-08-09: 0.6 mg via INTRAVENOUS

## 2012-08-09 MED ORDER — BUPIVACAINE LIPOSOME 1.3 % IJ SUSP
20.0000 mL | Freq: Once | INTRAMUSCULAR | Status: AC
  Administered 2012-08-09: 20 mL
  Filled 2012-08-09: qty 20

## 2012-08-09 MED ORDER — LIDOCAINE HCL (CARDIAC) 20 MG/ML IV SOLN
INTRAVENOUS | Status: DC | PRN
  Administered 2012-08-09: 80 mg via INTRAVENOUS

## 2012-08-09 MED ORDER — LACTATED RINGERS IV SOLN
INTRAVENOUS | Status: DC | PRN
  Administered 2012-08-09 (×2): via INTRAVENOUS

## 2012-08-09 MED ORDER — ACETAMINOPHEN 160 MG/5ML PO SOLN
650.0000 mg | ORAL | Status: DC | PRN
  Administered 2012-08-10: 650 mg via ORAL
  Filled 2012-08-09: qty 20.3

## 2012-08-09 MED ORDER — CEFOXITIN SODIUM-DEXTROSE 1-4 GM-% IV SOLR (PREMIX)
INTRAVENOUS | Status: AC
  Filled 2012-08-09: qty 100

## 2012-08-09 MED ORDER — HEPARIN SODIUM (PORCINE) 5000 UNIT/ML IJ SOLN
5000.0000 [IU] | Freq: Three times a day (TID) | INTRAMUSCULAR | Status: DC
  Administered 2012-08-09 – 2012-08-12 (×8): 5000 [IU] via SUBCUTANEOUS
  Filled 2012-08-09 (×12): qty 1

## 2012-08-09 MED ORDER — MORPHINE SULFATE 2 MG/ML IJ SOLN
2.0000 mg | INTRAMUSCULAR | Status: DC | PRN
  Administered 2012-08-09: 2 mg via INTRAVENOUS
  Administered 2012-08-09: 4 mg via INTRAVENOUS
  Administered 2012-08-09 (×2): 2 mg via INTRAVENOUS
  Administered 2012-08-09 – 2012-08-11 (×7): 4 mg via INTRAVENOUS
  Administered 2012-08-11 – 2012-08-12 (×3): 2 mg via INTRAVENOUS
  Filled 2012-08-09 (×2): qty 1
  Filled 2012-08-09: qty 2
  Filled 2012-08-09 (×2): qty 1
  Filled 2012-08-09 (×4): qty 2
  Filled 2012-08-09: qty 1
  Filled 2012-08-09 (×2): qty 2
  Filled 2012-08-09: qty 1
  Filled 2012-08-09: qty 2
  Filled 2012-08-09: qty 1

## 2012-08-09 MED ORDER — ROCURONIUM BROMIDE 100 MG/10ML IV SOLN
INTRAVENOUS | Status: DC | PRN
  Administered 2012-08-09: 20 mg via INTRAVENOUS
  Administered 2012-08-09: 15 mg via INTRAVENOUS
  Administered 2012-08-09: 10 mg via INTRAVENOUS
  Administered 2012-08-09: 30 mg via INTRAVENOUS

## 2012-08-09 MED ORDER — FENTANYL CITRATE 0.05 MG/ML IJ SOLN
INTRAMUSCULAR | Status: DC | PRN
  Administered 2012-08-09 (×2): 50 ug via INTRAVENOUS
  Administered 2012-08-09: 100 ug via INTRAVENOUS
  Administered 2012-08-09: 50 ug via INTRAVENOUS
  Administered 2012-08-09: 100 ug via INTRAVENOUS

## 2012-08-09 MED ORDER — SUCCINYLCHOLINE CHLORIDE 20 MG/ML IJ SOLN
INTRAMUSCULAR | Status: DC | PRN
  Administered 2012-08-09: 140 mg via INTRAVENOUS

## 2012-08-09 MED ORDER — ONDANSETRON HCL 4 MG/2ML IJ SOLN: 4.0000 mg | INTRAMUSCULAR | Status: DC | PRN

## 2012-08-09 MED ORDER — PROMETHAZINE HCL 25 MG/ML IJ SOLN
INTRAMUSCULAR | Status: AC
  Filled 2012-08-09: qty 1

## 2012-08-09 MED ORDER — LACTATED RINGERS IV SOLN
INTRAVENOUS | Status: DC | PRN
  Administered 2012-08-09: 1000 mL

## 2012-08-09 SURGICAL SUPPLY — 56 items
APL SKNCLS STERI-STRIP NONHPOA (GAUZE/BANDAGES/DRESSINGS) ×1
APPLIER CLIP ROT 10 11.4 M/L (STAPLE)
APR CLP MED LRG 11.4X10 (STAPLE)
BENZOIN TINCTURE PRP APPL 2/3 (GAUZE/BANDAGES/DRESSINGS) ×2 IMPLANT
CABLE HIGH FREQUENCY MONO STRZ (ELECTRODE) IMPLANT
CANISTER SUCTION 2500CC (MISCELLANEOUS) IMPLANT
CLAMP ENDO BABCK 10MM (STAPLE) ×2 IMPLANT
CLIP APPLIE ROT 10 11.4 M/L (STAPLE) IMPLANT
CLOTH BEACON ORANGE TIMEOUT ST (SAFETY) ×2 IMPLANT
COVER SURGICAL LIGHT HANDLE (MISCELLANEOUS) ×2 IMPLANT
DECANTER SPIKE VIAL GLASS SM (MISCELLANEOUS) ×2 IMPLANT
DEVICE SUT QUICK LOAD TK 5 (STAPLE) ×8 IMPLANT
DEVICE SUT TI-KNOT TK 5X26 (MISCELLANEOUS) ×2 IMPLANT
DEVICE SUTURE ENDOST 10MM (ENDOMECHANICALS) ×2 IMPLANT
DISSECTOR BLUNT TIP ENDO 5MM (MISCELLANEOUS) ×2 IMPLANT
DRAIN PENROSE 18X1/2 LTX STRL (DRAIN) ×2 IMPLANT
DRAPE LAPAROSCOPIC ABDOMINAL (DRAPES) ×2 IMPLANT
ELECT REM PT RETURN 9FT ADLT (ELECTROSURGICAL) ×2
ELECTRODE REM PT RTRN 9FT ADLT (ELECTROSURGICAL) ×1 IMPLANT
FELT TEFLON 4 X1 (Mesh General) ×2 IMPLANT
FILTER SMOKE EVAC LAPAROSHD (FILTER) IMPLANT
GLOVE BIOGEL M 8.0 STRL (GLOVE) ×2 IMPLANT
GLOVE BIOGEL PI IND STRL 7.0 (GLOVE) IMPLANT
GLOVE BIOGEL PI INDICATOR 7.0 (GLOVE)
GOWN STRL NON-REIN LRG LVL3 (GOWN DISPOSABLE) ×2 IMPLANT
GOWN STRL REIN XL XLG (GOWN DISPOSABLE) ×4 IMPLANT
GRASPER ENDO BABCOCK 10 (MISCELLANEOUS) IMPLANT
GRASPER ENDO BABCOCK 10MM (MISCELLANEOUS)
HAND ACTIVATED (MISCELLANEOUS) ×2 IMPLANT
KIT BASIN OR (CUSTOM PROCEDURE TRAY) ×2 IMPLANT
NS IRRIG 1000ML POUR BTL (IV SOLUTION) ×2 IMPLANT
PENCIL BUTTON HOLSTER BLD 10FT (ELECTRODE) IMPLANT
SCISSORS LAP 5X35 DISP (ENDOMECHANICALS) ×2 IMPLANT
SET IRRIG TUBING LAPAROSCOPIC (IRRIGATION / IRRIGATOR) ×2 IMPLANT
SLEEVE ADV FIXATION 5X100MM (TROCAR) IMPLANT
SLEEVE Z-THREAD 5X100MM (TROCAR) IMPLANT
SOLUTION ANTI FOG 6CC (MISCELLANEOUS) ×2 IMPLANT
STAPLER VISISTAT 35W (STAPLE) ×2 IMPLANT
STRIP CLOSURE SKIN 1/2X4 (GAUZE/BANDAGES/DRESSINGS) IMPLANT
SUT SURGIDAC NAB ES-9 0 48 120 (SUTURE) ×8 IMPLANT
SUT VIC AB 4-0 SH 18 (SUTURE) ×2 IMPLANT
SYR 30ML LL (SYRINGE) ×2 IMPLANT
TIP INNERVISION DETACH 40FR (MISCELLANEOUS) IMPLANT
TIP INNERVISION DETACH 50FR (MISCELLANEOUS) IMPLANT
TIP INNERVISION DETACH 56FR (MISCELLANEOUS) ×2 IMPLANT
TIPS INNERVISION DETACH 40FR (MISCELLANEOUS)
TRAY FOLEY CATH 14FRSI W/METER (CATHETERS) ×2 IMPLANT
TRAY LAP CHOLE (CUSTOM PROCEDURE TRAY) ×2 IMPLANT
TROCAR ADV FIXATION 11X100MM (TROCAR) IMPLANT
TROCAR ADV FIXATION 5X100MM (TROCAR) ×2 IMPLANT
TROCAR XCEL BLUNT TIP 100MML (ENDOMECHANICALS) IMPLANT
TROCAR XCEL NON-BLD 11X100MML (ENDOMECHANICALS) IMPLANT
TROCAR Z-THREAD FIOS 11X100 BL (TROCAR) ×2 IMPLANT
TROCAR Z-THREAD FIOS 5X100MM (TROCAR) ×2 IMPLANT
TROCAR Z-THREAD SLEEVE 11X100 (TROCAR) IMPLANT
TUBING FILTER THERMOFLATOR (ELECTROSURGICAL) ×2 IMPLANT

## 2012-08-09 NOTE — Interval H&P Note (Signed)
History and Physical Interval Note:  08/09/2012 9:09 AM  Wanda Moore  has presented today for surgery, with the diagnosis of gastroesophageal reflux disease Barretts  The various methods of treatment have been discussed with the patient and family. After consideration of risks, benefits and other options for treatment, the patient has consented to  Procedure(s) (LRB) with comments: LAPAROSCOPIC NISSEN FUNDOPLICATION (N/A) as a surgical intervention .  The patient's history has been reviewed, patient examined, no change in status, stable for surgery.  I have reviewed the patient's chart and labs.  Questions were answered to the patient's satisfaction.     Lakhia Gengler B

## 2012-08-09 NOTE — Preoperative (Signed)
Beta Blockers   Reason not to administer Beta Blockers:Not Applicable 

## 2012-08-09 NOTE — Anesthesia Postprocedure Evaluation (Signed)
  Anesthesia Post-op Note  Patient: Wanda Moore  Procedure(s) Performed: Procedure(s) (LRB): LAPAROSCOPIC NISSEN FUNDOPLICATION (N/A)  Patient Location: PACU  Anesthesia Type: General  Level of Consciousness: awake and alert   Airway and Oxygen Therapy: Patient Spontanous Breathing  Post-op Pain: mild  Post-op Assessment: Post-op Vital signs reviewed, Patient's Cardiovascular Status Stable, Respiratory Function Stable, Patent Airway and No signs of Nausea or vomiting  Post-op Vital Signs: stable  Complications: No apparent anesthesia complications. She doesn't feel great, but she is sleeping intermittently.

## 2012-08-09 NOTE — Op Note (Signed)
Surgeon: Wenda Low, MD, FACS  Asst:  P.J. Carolynne Edouard, MD, FACS  Anes:  General  Procedure: Laparoscopic repair of hiatal hernia with Nissen fundoplication over a #56 lighted bougie. One pledgeted suture approximating the hiatus and a 3 suture wrap.  Diagnosis: Gastroesophageal reflux a small hiatal hernia  Complications: none  EBL:   minimal cc  Description of Procedure:  The patient was taken to or 11 in given general anesthesia. Abdomen was prepped with PCMX and draped sterilely. Timeout was performed. Access to the abdomen was achieved to the left upper quadrant with a 0 5 mm Optiview technique without difficulty. A second 5 was used on the left side for the 30 5 mm scope. A 5 mm in the upper midline provide access for the [facility] retractor. The liver was somewhat enlarged with some fatty infiltration. A 1011 was placed slightly to the right of midline more inferiorly and then a 5 was placed more laterally. Nathanson retractor provided reasonable exposure owing to her enlarged left lateral segment. Harmonic scalpel was used to take down the gastrohepatic window identify the right crus and I carried my dissection anteriorly taking off the free no esophageal attachments. Short gastrics were divided with the harmonic scalpel to mobilize and a portion of the upper stomach. A little to get around behind the stomach with a Penrose drain and retracted while I completed the foregut dissection getting soft as well into the abdominal cavity and reducing any hiatal hernia.  I had good visualization of the crura and a single pledgeted suture seen to approximate this nicely. This was secured with a Ty-Knott and I felt like a second suture might be too much and I then passed a 56 lighted bougie and it completely occupied the crural opening so felt like the crural approximation was probably needed. I then reached around behind and grasped stomach and brought around and then created the invagination of the  distal esophagus of the stomach using 3 sutures giving purchases of the esophagus as I created the Nissen fundoplication over the 56 dilator. All 3 were held in place with Ty knots. Completion a good wrap was present. No bleeding was noted. We inspected the general abdominal cavity everything appeared to be in order. The port sites were injected with Exparel and were closed 4-0 Vicryl and Dermabond. Patient tolerated procedure well taken recovery in satisfactory condition.  Matt B. Daphine Deutscher, MD, [facility], [place] [phone]

## 2012-08-09 NOTE — Care Management Note (Signed)
    Page 1 of 1   08/13/2012     1:01:35 PM   CARE MANAGEMENT NOTE 08/13/2012  Patient:  Wanda Moore, Wanda Moore   Account Number:  [id number]  Date Initiated:  08/09/2012  Documentation initiated by:  Lorenda Ishihara  Subjective/Objective Assessment:   51 yo female admitted s/p lap fundoplication. PTA lived at home with spouse.     Action/Plan:   Anticipated DC Date:  08/12/2012   Anticipated DC Plan:  HOME/SELF CARE      DC Planning Services  CM consult      Choice offered to / List presented to:             Status of service:  Completed, signed off Medicare Important Message given?   (If response is "NO", the following Medicare IM given date fields will be blank) Date Medicare IM given:   Date Additional Medicare IM given:    Discharge Disposition:  HOME/SELF CARE  Per UR Regulation:  Reviewed for med. necessity/level of care/duration of stay  If discussed at Long Length of Stay Meetings, dates discussed:    Comments:

## 2012-08-09 NOTE — Transfer of Care (Signed)
Immediate Anesthesia Transfer of Care Note  Patient: Wanda Moore  Procedure(s) Performed: Procedure(s) (LRB) with comments: LAPAROSCOPIC NISSEN FUNDOPLICATION (N/A) - HIATAL HERNIA REPAIR  Patient Location: PACU  Anesthesia Type: General  Level of Consciousness: awake, alert , oriented, patient cooperative and responds to stimulation  Airway & Oxygen Therapy: Patient Spontanous Breathing and Patient connected to face mask oxygen  Post-op Assessment: Report given to PACU RN, Post -op Vital signs reviewed and stable and Patient moving all extremities X 4  Post vital signs: Reviewed and stable  Complications: No apparent anesthesia complications

## 2012-08-09 NOTE — Anesthesia Preprocedure Evaluation (Addendum)
Anesthesia Evaluation  Patient identified by MRN, date of birth, ID band Patient awake    Reviewed: Allergy & Precautions, H&P , NPO status , Patient's Chart, lab work & pertinent test results  Airway Mallampati: II TM Distance: >3 FB Neck ROM: Full    Dental No notable dental hx.    Pulmonary neg pulmonary ROS,  breath sounds clear to auscultation  Pulmonary exam normal       Cardiovascular negative cardio ROS  Rhythm:Regular Rate:Normal     Neuro/Psych  Neuromuscular disease negative psych ROS   GI/Hepatic Neg liver ROS, hiatal hernia, GERD-  Medicated,  Endo/Other  negative endocrine ROS  Renal/GU negative Renal ROS  negative genitourinary   Musculoskeletal negative musculoskeletal ROS (+)   Abdominal (+) + obese,   Peds negative pediatric ROS (+)  Hematology negative hematology ROS (+)   Anesthesia Other Findings   Reproductive/Obstetrics negative OB ROS                           Anesthesia Physical Anesthesia Plan  ASA: II  Anesthesia Plan: General   Post-op Pain Management:    Induction: Intravenous  Airway Management Planned: Oral ETT  Additional Equipment:   Intra-op Plan:   Post-operative Plan: Extubation in OR  Informed Consent: I have reviewed the patients History and Physical, chart, labs and discussed the procedure including the risks, benefits and alternatives for the proposed anesthesia with the patient or authorized representative who has indicated his/her understanding and acceptance.   Dental advisory given  Plan Discussed with: CRNA  Anesthesia Plan Comments:         Anesthesia Quick Evaluation

## 2012-08-10 ENCOUNTER — Inpatient Hospital Stay (HOSPITAL_COMMUNITY): Payer: BC Managed Care – PPO

## 2012-08-10 LAB — CBC WITH DIFFERENTIAL/PLATELET
Basophils Absolute: 0 10*3/uL (ref 0.0–0.1)
Basophils Relative: 0 % (ref 0–1)
Eosinophils Absolute: 0 10*3/uL (ref 0.0–0.7)
Eosinophils Relative: 0 % (ref 0–5)
HCT: 31.8 % — ABNORMAL LOW (ref 36.0–46.0)
Hemoglobin: 10.9 g/dL — ABNORMAL LOW (ref 12.0–15.0)
Lymphocytes Relative: 17 % (ref 12–46)
Lymphs Abs: 2 10*3/uL (ref 0.7–4.0)
MCH: 27.7 pg (ref 26.0–34.0)
MCHC: 34.3 g/dL (ref 30.0–36.0)
MCV: 80.9 fL (ref 78.0–100.0)
Monocytes Absolute: 0.9 10*3/uL (ref 0.1–1.0)
Monocytes Relative: 8 % (ref 3–12)
Neutro Abs: 8.8 10*3/uL — ABNORMAL HIGH (ref 1.7–7.7)
Neutrophils Relative %: 75 % (ref 43–77)
Platelets: 246 10*3/uL (ref 150–400)
RBC: 3.93 MIL/uL (ref 3.87–5.11)
RDW: 13.5 % (ref 11.5–15.5)
WBC: 11.8 10*3/uL — ABNORMAL HIGH (ref 4.0–10.5)

## 2012-08-10 MED ORDER — IOHEXOL 300 MG/ML  SOLN: 50.0000 mL | Freq: Once | INTRAMUSCULAR | Status: AC | PRN

## 2012-08-10 NOTE — Progress Notes (Signed)
Patient ID: Wanda Moore, female   DOB: November 10, 1960, 51 y.o.   MRN: [id number]  General Surgery - [facility] - Progress Note  POD# 1  Subjective: Patient status post Nissen fundoplication yesterday.  Doing well.  Gastrograffin swallow this AM without leak or obstruction.  Patient anxious to begin liquid diet.  Family at bedside.  Objective: Vital signs in last 24 hours: Temp:  [98.1 F (36.7 C)-98.9 F (37.2 C)] 98.3 F (36.8 C) (10/05 0412) Pulse Rate:  [74-97] 87  (10/05 0412) Resp:  [13-23] 18  (10/05 0412) BP: (131-174)/(76-88) 140/86 mmHg (10/05 0412) SpO2:  [9 %-99 %] 9 % (10/05 0412) Weight:  [250 lb (113.399 kg)] 250 lb (113.399 kg) (10/04 1616) Last BM Date:  (pta)  Intake/Output from previous day: 10/04 0701 - 10/05 0700 In: 3401.7 [I.V.:3401.7] Out: 1285 [Urine:1275; Blood:10]  Exam: HEENT - clear, not icteric Neck - soft Chest - clear bilaterally Cor - RRR, no murmur Abd - soft, obese, incisions dry and intact with Dermabond; BS present Ext - no significant edema Neuro - grossly intact, no focal deficits  Lab Results:   Basename 08/10/12 0447 08/09/12 1403  WBC 11.8* 13.5*  HGB 10.9* 11.3*  HCT 31.8* 33.3*  PLT 246 236     Basename 08/09/12 1403  NA --  K --  CL --  CO2 --  GLUCOSE --  BUN --  CREATININE 0.65  CALCIUM --    Studies/Results: Dg Ugi W/water Sol Cm  08/10/2012  *RADIOLOGY REPORT*  Clinical Data:  Status post Nissen fundoplication on 08/09/2012.  UPPER GI SERIES WITHOUT KUB  Technique:  Routine upper GI series was performed with water soluble contrast .  Fluoroscopy Time: 17 seconds  Comparison:  05/28/2012  Findings: Fluoroscopy is performed during administration of water soluble contrast.  A Nissen defect is identified in the proximal stomach.  Contrast passes easily through the defect.  No evidence for obstruction or mass.  IMPRESSION:  1.  Status post Nissen fundoplication. 2.  No evidence for obstruction.    Original Report Authenticated By: Patterson Hammersmith, M.D.     Assessment / Plan: 1.  Status post Nissen fundoplication  - gastrograffin study normal post op  - begin clear liquid diet with usual restrictions - reviewed with patient  - OOB, ambulate in halls  Velora Heckler, MD, [facility] Office: [phone]  08/10/2012

## 2012-08-11 MED ORDER — ZOLPIDEM TARTRATE 5 MG PO TABS
5.0000 mg | ORAL_TABLET | Freq: Every evening | ORAL | Status: DC | PRN
  Administered 2012-08-11 (×2): 5 mg via ORAL
  Filled 2012-08-11 (×2): qty 1

## 2012-08-11 MED ORDER — BIOTENE DRY MOUTH MT LIQD
15.0000 mL | Freq: Two times a day (BID) | OROMUCOSAL | Status: DC
  Administered 2012-08-11 – 2012-08-12 (×2): 15 mL via OROMUCOSAL

## 2012-08-11 MED ORDER — CHLORHEXIDINE GLUCONATE 0.12 % MT SOLN
15.0000 mL | Freq: Two times a day (BID) | OROMUCOSAL | Status: DC
  Administered 2012-08-11 – 2012-08-12 (×2): 15 mL via OROMUCOSAL
  Filled 2012-08-11 (×6): qty 15

## 2012-08-11 NOTE — Progress Notes (Signed)
Patient ID: Wanda Moore, female   DOB: [birth year], 51 y.o.   MRN: [id number]  General Surgery - [facility] - Progress Note  POD# 2  Subjective: Patient up this AM.  Ambulated in halls.  No nausea or emesis.  Objective: Vital signs in last 24 hours: Temp:  [97.9 F (36.6 C)-99 F (37.2 C)] 97.9 F (36.6 C) (10/06 0530) Pulse Rate:  [84-91] 87  (10/06 0530) Resp:  [18-20] 18  (10/06 0530) BP: (136-147)/(85-91) 147/91 mmHg (10/06 0530) SpO2:  [92 %-97 %] 97 % (10/06 0530) Last BM Date:  (pta)  Intake/Output from previous day: 10/05 0701 - 10/06 0700 In: 1359.2 [P.O.:360; I.V.:999.2] Out: 2600 [Urine:2600]  Exam: HEENT - clear, not icteric Neck - soft Chest - clear bilaterally Cor - RRR, no murmur Abd - soft without distension; BS present; wounds clear and dry Ext - no significant edema Neuro - grossly intact, no focal deficits  Lab Results:   Basename 08/10/12 0447 08/09/12 1403  WBC 11.8* 13.5*  HGB 10.9* 11.3*  HCT 31.8* 33.3*  PLT 246 236     Basename 08/09/12 1403  NA --  K --  CL --  CO2 --  GLUCOSE --  BUN --  CREATININE 0.65  CALCIUM --    Studies/Results: Dg Ugi W/water Sol Cm  08/10/2012  *RADIOLOGY REPORT*  Clinical Data:  Status post Nissen fundoplication on 08/09/2012.  UPPER GI SERIES WITHOUT KUB  Technique:  Routine upper GI series was performed with water soluble contrast .  Fluoroscopy Time: 17 seconds  Comparison:  05/28/2012  Findings: Fluoroscopy is performed during administration of water soluble contrast.  A Nissen defect is identified in the proximal stomach.  Contrast passes easily through the defect.  No evidence for obstruction or mass.  IMPRESSION:  1.  Status post Nissen fundoplication. 2.  No evidence for obstruction.   Original Report Authenticated By: Patterson Hammersmith, M.D.     Assessment / Plan: 1.  Status post Nissen fundoplication  - advance to full liquid diet  - ambulate  - wean oxygen  - likely  home in AM 10/7  Velora Heckler, MD, [facility] Office: [phone]  08/11/2012

## 2012-08-12 ENCOUNTER — Encounter (HOSPITAL_COMMUNITY): Payer: Self-pay | Admitting: Surgery

## 2012-08-12 MED ORDER — ZOLPIDEM TARTRATE 5 MG PO TABS: 5.0000 mg | ORAL_TABLET | Freq: Every evening | ORAL | Status: DC | PRN

## 2012-08-12 MED ORDER — PANTOPRAZOLE SODIUM 40 MG PO TBEC: 40.0000 mg | DELAYED_RELEASE_TABLET | Freq: Every morning | ORAL | Status: DC

## 2012-08-12 NOTE — Progress Notes (Signed)
Patient ID: Wanda Moore, female   DOB: [birth year], 51 y.o.   MRN: [id number] 3 Days Post-Op  Subjective: Pt feels well.  Pain controlled.  Tolerating full liquids  Objective: Vital signs in last 24 hours: Temp:  [97.8 F (36.6 C)-98.1 F (36.7 C)] 98.1 F (36.7 C) (10/07 1400) Pulse Rate:  [70-83] 70  (10/07 1400) Resp:  [16-18] 18  (10/07 1400) BP: (134-142)/(84-92) 135/84 mmHg (10/07 1400) SpO2:  [90 %-97 %] 93 % (10/07 1400) Last BM Date: 08/12/12  Intake/Output from previous day: 10/06 0701 - 10/07 0700 In: 3503.8 [P.O.:1320; I.V.:2183.8] Out: 2050 [Urine:2050] Intake/Output this shift: Total I/O In: 120 [P.O.:120] Out: 525 [Urine:525]  PE: Abd: soft, appropriately tender, +BS, incisions c/d/i  Lab Results:   Basename 08/10/12 0447  WBC 11.8*  HGB 10.9*  HCT 31.8*  PLT 246   BMET No results found for this basename: NA:2,K:2,CL:2,CO2:2,GLUCOSE:2,BUN:2,CREATININE:2,CALCIUM:2 in the last 72 hours PT/INR No results found for this basename: LABPROT:2,INR:2 in the last 72 hours CMP     Component Value Date/Time   NA 139 03/31/2011 1012   K 4.2 03/31/2011 1012   CL 105 03/31/2011 1012   CO2 28 03/31/2011 1012   GLUCOSE 79 03/31/2011 1012   BUN 11 03/31/2011 1012   CREATININE 0.65 08/09/2012 1403   CALCIUM 9.1 03/31/2011 1012   PROT 6.7 03/31/2011 1012   ALBUMIN 3.6 03/31/2011 1012   AST 26 03/31/2011 1012   ALT 32 03/31/2011 1012   ALKPHOS 62 03/31/2011 1012   BILITOT 0.4 03/31/2011 1012   GFRNONAA >90 08/09/2012 1403   GFRAA >90 08/09/2012 1403   Lipase  No results found for this basename: lipase       Studies/Results: No results found.  Anti-infectives: Anti-infectives     Start     Dose/Rate Route Frequency Ordered Stop   08/09/12 0725   cefOXitin (MEFOXIN) 2 g in dextrose 5 % 50 mL IVPB        2 g 100 mL/hr over 30 Minutes Intravenous 60 min pre-op 08/09/12 0725 08/09/12 0951           Assessment/Plan  1. S/p Nissen fundoplication  Plan: 1.  Ok for [employer] home.   LOS: 3 days    Wanda Moore E 08/12/2012, 2:51 PM Pager: [phone]

## 2012-08-12 NOTE — Progress Notes (Signed)
Pt requiring PO and IV pain medicine.  Patient with hyperactive bs in all quads, + flatus. Patient ambulated multiple times in hallway 10/06.  Tolerated well/  Patient encouraged to in frequency of ambulation verus duration in hallways to help relieve gas pains.  Will try to control pain with ambulation and PO pain medicine.  Patient verbalizes understanding of need to be able to control pain with PO pills due to at home will only have PO pain medicine.  Call light within reach.  Patient verbalized understanding of plan of care.

## 2012-08-16 NOTE — Discharge Summary (Signed)
Physician Discharge Summary  Patient ID: Wanda Moore MRN: [id number] DOB/AGE: 02-17-1961 52 y.o.  Admit date: 08/09/2012 Discharge date: 08/12/12  Admission Diagnoses: GERD  Discharge Diagnoses:  Same; post hiatus hernia repair and lap Nissen fundoplication Active Problems:  BARRETTS ESOPHAGUS  HIATAL HERNIA   Surgery:  Laparoscopic hiatal hernia repair and Nissen fundoplication  Discharged Condition: improved  Hospital Course:   Had surgery on Friday by me.  UGI on Saturday looked OK.  Was discharged by my partners on Monday.    Consults: none  Significant Diagnostic Studies: UGI    Discharge Exam: Blood pressure 135/84, pulse 70, temperature 98.1 F (36.7 C), temperature source Oral, resp. rate 18, height 6' (1.829 m), weight 250 lb (113.399 kg), SpO2 93.00%. Not performed by me.   Disposition: 01-Home or Self Care  Discharge Orders    Future Appointments: Provider: Department: Dept Phone: Center:   08/23/2012 2:20 PM Valarie Merino, MD [facility] [phone] None       Medication List     As of 08/16/2012  7:41 AM    STOP taking these medications         [facility] COLD RELIEF DAY/NIGHT PO      TAKE these medications         Clobetasol Prop Emollient Base 0.05 % emollient cream   Apply 1 application topically as needed. Do not use on face or genitals      estradiol-norethindrone 1-0.5 MG per tablet   Commonly known as: ACTIVELLA   Take 1 tablet by mouth daily. mimvey      pantoprazole 40 MG tablet   Commonly known as: PROTONIX   Take 1 tablet (40 mg total) by mouth every morning.      zolpidem 5 MG tablet   Commonly known as: AMBIEN   Take 1 tablet (5 mg total) by mouth at bedtime as needed for sleep (may repeat times one if no result).           Follow-up Information    Follow up with Joaquin Knebel B, MD. In 4 weeks.   Contact information:   [address] [place] [address] [phone]           Signed: Valarie Merino 08/16/2012, 7:41 AM

## 2012-08-20 ENCOUNTER — Telehealth (INDEPENDENT_AMBULATORY_CARE_PROVIDER_SITE_OTHER): Payer: Self-pay | Admitting: General Surgery

## 2012-08-20 NOTE — Telephone Encounter (Signed)
LMOM letting pt know that I am having to reschedule her appt w/ Dr. Daphine Deutscher from 10/18 to 10/21 at 5:15.  I also sent a new reminder card in the mail.

## 2012-08-23 ENCOUNTER — Encounter (INDEPENDENT_AMBULATORY_CARE_PROVIDER_SITE_OTHER): Payer: BC Managed Care – PPO | Admitting: Surgery

## 2012-08-26 ENCOUNTER — Encounter (INDEPENDENT_AMBULATORY_CARE_PROVIDER_SITE_OTHER): Payer: Self-pay | Admitting: Surgery

## 2012-08-26 ENCOUNTER — Ambulatory Visit (INDEPENDENT_AMBULATORY_CARE_PROVIDER_SITE_OTHER): Payer: BC Managed Care – PPO | Admitting: Surgery

## 2012-08-26 VITALS — HR 72 | Temp 97.9°F | Resp 16 | Ht 72.0 in | Wt 236.0 lb

## 2012-08-26 DIAGNOSIS — Z9889 Other specified postprocedural states: Secondary | ICD-10-CM

## 2012-08-26 DIAGNOSIS — Z09 Encounter for follow-up examination after completed treatment for conditions other than malignant neoplasm: Secondary | ICD-10-CM

## 2012-08-26 NOTE — Patient Instructions (Signed)
Thanks for your patience.  If you need further assistance after leaving the office, please call our office and speak with a [facility] nurse.  [phone].  If you want to leave a message for Dr. Drexel Ivey, please call his office phone at [phone]. 

## 2012-08-26 NOTE — Progress Notes (Signed)
Wanda Moore 51 y.o.  Body mass index is 32.01 kg/(m^2).  Patient Active Problem List  Diagnosis  . SINUSITIS- ACUTE-NOS  . ALLERGIC RHINITIS  . ESOPHAGEAL STRICTURE  . GERD  . BARRETTS ESOPHAGUS  . DIVERTICULOSIS, COLON  . NECK PAIN  . MELANOMA, TRUNK, HX OF  . CHOLECYSTECTOMY, HX OF  . Nissen fundoplication October 2013    Allergies  Allergen Reactions  . Buprenex (Buprenorphine) Itching    Past Surgical History  Procedure Date  . Cesarean section   . Melanoma excision     back  . Cholecystectomy   . Laparoscopic nissen fundoplication 08/09/2012    Procedure: LAPAROSCOPIC NISSEN FUNDOPLICATION;  Surgeon: Valarie Merino, MD;  Location: [facility];  Service: General;  Laterality: N/A;  HIATAL HERNIA REPAIR   Oliver Barre, MD 1. Nissen fundoplication October 2013     Overall I think she is doing well after her Nissen fundoplication done October 4. We discussed slowly advancing her diet and certainly chewing her food very well. I gave her the old [facility] Nissen fundoplication dietary information off the old website.  I told her to go ahead and stop her PPI and see how she does. I'll see her back in 6 weeks Matt B. Daphine Deutscher, MD, [facility] [phone] beeper [phone]  08/26/2012 5:20 PM

## 2012-10-10 ENCOUNTER — Other Ambulatory Visit: Payer: Self-pay | Admitting: Obstetrics and Gynecology

## 2012-10-17 ENCOUNTER — Ambulatory Visit (INDEPENDENT_AMBULATORY_CARE_PROVIDER_SITE_OTHER): Payer: BC Managed Care – PPO | Admitting: Surgery

## 2012-10-17 ENCOUNTER — Encounter (INDEPENDENT_AMBULATORY_CARE_PROVIDER_SITE_OTHER): Payer: Self-pay | Admitting: Surgery

## 2012-10-17 VITALS — BP 148/100 | HR 68 | Temp 97.7°F | Resp 16 | Ht 72.0 in | Wt 226.4 lb

## 2012-10-17 DIAGNOSIS — Z09 Encounter for follow-up examination after completed treatment for conditions other than malignant neoplasm: Secondary | ICD-10-CM

## 2012-10-17 DIAGNOSIS — Z9889 Other specified postprocedural states: Secondary | ICD-10-CM

## 2012-10-17 NOTE — Patient Instructions (Signed)
Thanks for your patience.  If you need further assistance after leaving the office, please call our office and speak with a [facility] nurse.  [phone].  If you want to leave a message for Dr. Giovonnie Trettel, please call his office phone at [phone]. 

## 2012-10-17 NOTE — Progress Notes (Signed)
Wanda Moore 51 y.o.  Body mass index is 30.71 kg/(m^2).  Patient Active Problem List  Diagnosis  . SINUSITIS- ACUTE-NOS  . ALLERGIC RHINITIS  . ESOPHAGEAL STRICTURE  . GERD  . BARRETTS ESOPHAGUS  . DIVERTICULOSIS, COLON  . NECK PAIN  . MELANOMA, TRUNK, HX OF  . CHOLECYSTECTOMY, HX OF  . Nissen fundoplication October 2013    Allergies  Allergen Reactions  . Buprenex (Buprenorphine) Itching    Past Surgical History  Procedure Date  . Cesarean section   . Melanoma excision     back  . Cholecystectomy   . Laparoscopic nissen fundoplication 08/09/2012    Procedure: LAPAROSCOPIC NISSEN FUNDOPLICATION;  Surgeon: Valarie Merino, MD;  Location: [facility];  Service: General;  Laterality: N/A;  HIATAL HERNIA REPAIR   Oliver Barre, MD No diagnosis found.  Doing very well after Nissen fundoplication.  No heart burn or GER.  Off PPI Dysphagia much better.  Looks good. Return PRN Matt B. Daphine Deutscher, MD, [facility] [phone] beeper [phone]  10/17/2012 10:04 AM

## 2012-12-05 ENCOUNTER — Encounter (HOSPITAL_BASED_OUTPATIENT_CLINIC_OR_DEPARTMENT_OTHER): Payer: Self-pay | Admitting: *Deleted

## 2012-12-05 ENCOUNTER — Encounter (HOSPITAL_BASED_OUTPATIENT_CLINIC_OR_DEPARTMENT_OTHER): Payer: Self-pay | Admitting: Certified Registered Nurse Anesthetist

## 2012-12-05 ENCOUNTER — Ambulatory Visit (HOSPITAL_BASED_OUTPATIENT_CLINIC_OR_DEPARTMENT_OTHER): Payer: Worker's Compensation | Admitting: Certified Registered Nurse Anesthetist

## 2012-12-05 ENCOUNTER — Encounter (HOSPITAL_BASED_OUTPATIENT_CLINIC_OR_DEPARTMENT_OTHER): Payer: Self-pay | Admitting: Orthopedic Surgery

## 2012-12-05 ENCOUNTER — Encounter (HOSPITAL_BASED_OUTPATIENT_CLINIC_OR_DEPARTMENT_OTHER)
Admission: RE | Disposition: A | Discharge status: Home / Self Care | Payer: Self-pay | Source: Ambulatory Visit | Attending: Orthopedic Surgery

## 2012-12-05 ENCOUNTER — Ambulatory Visit (HOSPITAL_BASED_OUTPATIENT_CLINIC_OR_DEPARTMENT_OTHER)
Admission: RE | Admit: 2012-12-05 | Discharge: 2012-12-05 | Disposition: A | Discharge status: Home / Self Care | Payer: Worker's Compensation | Source: Ambulatory Visit | Attending: Orthopedic Surgery | Admitting: Orthopedic Surgery

## 2012-12-05 ENCOUNTER — Ambulatory Visit (HOSPITAL_COMMUNITY): Payer: Worker's Compensation

## 2012-12-05 DIAGNOSIS — S52502A Unspecified fracture of the lower end of left radius, initial encounter for closed fracture: Secondary | ICD-10-CM | POA: Diagnosis present

## 2012-12-05 DIAGNOSIS — W010XXA Fall on same level from slipping, tripping and stumbling without subsequent striking against object, initial encounter: Secondary | ICD-10-CM | POA: Insufficient documentation

## 2012-12-05 DIAGNOSIS — S52509A Unspecified fracture of the lower end of unspecified radius, initial encounter for closed fracture: Secondary | ICD-10-CM

## 2012-12-05 DIAGNOSIS — S52599A Other fractures of lower end of unspecified radius, initial encounter for closed fracture: Secondary | ICD-10-CM | POA: Insufficient documentation

## 2012-12-05 DIAGNOSIS — Y9289 Other specified places as the place of occurrence of the external cause: Secondary | ICD-10-CM | POA: Insufficient documentation

## 2012-12-05 HISTORY — DX: Unspecified fracture of the lower end of left radius, initial encounter for closed fracture: S52.502A

## 2012-12-05 HISTORY — PX: OPEN REDUCTION INTERNAL FIXATION (ORIF) DISTAL RADIAL FRACTURE: SHX5989

## 2012-12-05 LAB — POCT HEMOGLOBIN-HEMACUE: Hemoglobin: 13.5 g/dL (ref 12.0–15.0)

## 2012-12-05 SURGERY — OPEN REDUCTION INTERNAL FIXATION (ORIF) DISTAL RADIUS FRACTURE
Anesthesia: General | Site: Wrist | Laterality: Left | Wound class: Clean

## 2012-12-05 MED ORDER — FENTANYL CITRATE 0.05 MG/ML IJ SOLN
50.0000 ug | Freq: Once | INTRAMUSCULAR | Status: AC
  Administered 2012-12-05: 100 ug via INTRAVENOUS

## 2012-12-05 MED ORDER — MEPERIDINE HCL 25 MG/ML IJ SOLN: 6.2500 mg | INTRAMUSCULAR | Status: DC | PRN

## 2012-12-05 MED ORDER — LACTATED RINGERS IV SOLN
INTRAVENOUS | Status: DC
  Administered 2012-12-05: 12:00:00 via INTRAVENOUS

## 2012-12-05 MED ORDER — BUPIVACAINE-EPINEPHRINE PF 0.5-1:200000 % IJ SOLN
INTRAMUSCULAR | Status: DC | PRN
  Administered 2012-12-05: 30 mL

## 2012-12-05 MED ORDER — DEXAMETHASONE SODIUM PHOSPHATE 4 MG/ML IJ SOLN
INTRAMUSCULAR | Status: DC | PRN
  Administered 2012-12-05: 10 mg via INTRAVENOUS

## 2012-12-05 MED ORDER — MIDAZOLAM HCL 2 MG/2ML IJ SOLN
1.0000 mg | INTRAMUSCULAR | Status: DC | PRN
  Administered 2012-12-05: 2 mg via INTRAVENOUS

## 2012-12-05 MED ORDER — OXYCODONE HCL 5 MG PO TABS: 5.0000 mg | ORAL_TABLET | Freq: Once | ORAL | Status: DC | PRN

## 2012-12-05 MED ORDER — OXYCODONE HCL 5 MG/5ML PO SOLN
5.0000 mg | Freq: Once | ORAL | Status: DC | PRN
Start: 2012-12-05 — End: 2012-12-05

## 2012-12-05 MED ORDER — PROMETHAZINE HCL 25 MG PO TABS
25.0000 mg | ORAL_TABLET | Freq: Four times a day (QID) | ORAL | Status: DC | PRN
Start: 2012-12-05 — End: 2013-05-01

## 2012-12-05 MED ORDER — CEFAZOLIN SODIUM-DEXTROSE 2-3 GM-% IV SOLR
2.0000 g | INTRAVENOUS | Status: AC
  Administered 2012-12-05: 2 g via INTRAVENOUS

## 2012-12-05 MED ORDER — HYDROMORPHONE HCL PF 1 MG/ML IJ SOLN: 0.2500 mg | INTRAMUSCULAR | Status: DC | PRN

## 2012-12-05 MED ORDER — LIDOCAINE HCL (CARDIAC) 20 MG/ML IV SOLN
INTRAVENOUS | Status: DC | PRN
  Administered 2012-12-05: 100 mg via INTRAVENOUS

## 2012-12-05 MED ORDER — ONDANSETRON HCL 4 MG/2ML IJ SOLN: 4.0000 mg | Freq: Once | INTRAMUSCULAR | Status: DC | PRN

## 2012-12-05 MED ORDER — PROPOFOL 10 MG/ML IV BOLUS
INTRAVENOUS | Status: DC | PRN
  Administered 2012-12-05: 200 mg via INTRAVENOUS

## 2012-12-05 SURGICAL SUPPLY — 68 items
APL SKNCLS STERI-STRIP NONHPOA (GAUZE/BANDAGES/DRESSINGS) ×1
BANDAGE ELASTIC 3 VELCRO ST LF (GAUZE/BANDAGES/DRESSINGS) ×2 IMPLANT
BANDAGE ELASTIC 4 VELCRO ST LF (GAUZE/BANDAGES/DRESSINGS) ×2 IMPLANT
BENZOIN TINCTURE PRP APPL 2/3 (GAUZE/BANDAGES/DRESSINGS) ×2 IMPLANT
BIT DRILL 2 FAST STEP (BIT) ×2 IMPLANT
BIT DRILL 2.5X4 QC (BIT) ×2 IMPLANT
BLADE MINI RND TIP GREEN BEAV (BLADE) IMPLANT
BLADE SURG 15 STRL LF DISP TIS (BLADE) ×1 IMPLANT
BLADE SURG 15 STRL SS (BLADE) ×2
BNDG CMPR 9X4 STRL LF SNTH (GAUZE/BANDAGES/DRESSINGS) ×1
BNDG COHESIVE 4X5 TAN STRL (GAUZE/BANDAGES/DRESSINGS) IMPLANT
BNDG ESMARK 4X9 LF (GAUZE/BANDAGES/DRESSINGS) ×2 IMPLANT
CLOTH BEACON ORANGE TIMEOUT ST (SAFETY) ×2 IMPLANT
COVER TABLE BACK 60X90 (DRAPES) ×2 IMPLANT
CUFF TOURNIQUET SINGLE 18IN (TOURNIQUET CUFF) ×2 IMPLANT
DECANTER SPIKE VIAL GLASS SM (MISCELLANEOUS) ×2 IMPLANT
DRAPE EXTREMITY T 121X128X90 (DRAPE) ×2 IMPLANT
DRAPE INCISE IOBAN 66X45 STRL (DRAPES) ×2 IMPLANT
DRAPE OEC MINIVIEW 54X84 (DRAPES) ×2 IMPLANT
DRAPE SURG 17X23 STRL (DRAPES) ×2 IMPLANT
DRAPE U 20/CS (DRAPES) ×2 IMPLANT
DURAPREP 26ML APPLICATOR (WOUND CARE) ×2 IMPLANT
ELECT REM PT RETURN 9FT ADLT (ELECTROSURGICAL) ×2
ELECTRODE REM PT RTRN 9FT ADLT (ELECTROSURGICAL) ×1 IMPLANT
GLOVE BIO SURGEON STRL SZ 6.5 (GLOVE) ×4 IMPLANT
GLOVE BIOGEL PI IND STRL 8 (GLOVE) ×1 IMPLANT
GLOVE BIOGEL PI INDICATOR 8 (GLOVE) ×1
GLOVE ORTHO TXT STRL SZ7.5 (GLOVE) ×2 IMPLANT
GLOVE SURG ORTHO 8.0 STRL STRW (GLOVE) ×2 IMPLANT
GOWN BRE IMP PREV XXLGXLNG (GOWN DISPOSABLE) ×2 IMPLANT
GOWN BRE IMP SLV AUR XL STRL (GOWN DISPOSABLE) ×4 IMPLANT
NEEDLE HYPO 25X1 1.5 SAFETY (NEEDLE) IMPLANT
NS IRRIG 1000ML POUR BTL (IV SOLUTION) ×2 IMPLANT
PACK BASIN DAY SURGERY FS (CUSTOM PROCEDURE TRAY) ×2 IMPLANT
PAD CAST 3X4 CTTN HI CHSV (CAST SUPPLIES) ×2 IMPLANT
PAD CAST 4YDX4 CTTN HI CHSV (CAST SUPPLIES) IMPLANT
PADDING CAST ABS 4INX4YD NS (CAST SUPPLIES) ×1
PADDING CAST ABS COTTON 4X4 ST (CAST SUPPLIES) ×1 IMPLANT
PADDING CAST COTTON 3X4 STRL (CAST SUPPLIES) ×4
PADDING CAST COTTON 4X4 STRL (CAST SUPPLIES)
PEG SUBCHONDRAL SMOOTH 2.0X18 (Peg) ×2 IMPLANT
PEG SUBCHONDRAL SMOOTH 2.0X20 (Peg) ×10 IMPLANT
PEG SUBCHONDRAL SMOOTH 2.0X22 (Peg) ×4 IMPLANT
PEG SUBCHONDRAL SMOOTH 2.0X28 (Peg) ×2 IMPLANT
PENCIL BUTTON HOLSTER BLD 10FT (ELECTRODE) ×2 IMPLANT
PLATE SHORT 24.4X51.3 LT (Plate) ×2 IMPLANT
SCREW BN 12X3.5XNS CORT TI (Screw) ×1 IMPLANT
SCREW CORT 3.5X12 (Screw) ×2 IMPLANT
SCREW CORT 3.5X14 LNG (Screw) ×2 IMPLANT
SLEEVE SCD COMPRESS KNEE MED (MISCELLANEOUS) ×2 IMPLANT
SPLINT PLASTER CAST XFAST 3X15 (CAST SUPPLIES) ×20 IMPLANT
SPLINT PLASTER XTRA FASTSET 3X (CAST SUPPLIES) ×20
SPONGE GAUZE 4X4 12PLY (GAUZE/BANDAGES/DRESSINGS) ×2 IMPLANT
STOCKINETTE 4X48 STRL (DRAPES) ×2 IMPLANT
STRIP CLOSURE SKIN 1/2X4 (GAUZE/BANDAGES/DRESSINGS) ×2 IMPLANT
SUCTION FRAZIER TIP 10 FR DISP (SUCTIONS) ×2 IMPLANT
SUT ETHILON 3 0 PS 1 (SUTURE) ×2 IMPLANT
SUT ETHILON 4 0 PS 2 18 (SUTURE) IMPLANT
SUT MNCRL AB 4-0 PS2 18 (SUTURE) IMPLANT
SUT VIC AB 0 CT1 27 (SUTURE)
SUT VIC AB 0 CT1 27XBRD ANBCTR (SUTURE) IMPLANT
SUT VICRYL 3-0 CR8 SH (SUTURE) ×2 IMPLANT
SYR BULB 3OZ (MISCELLANEOUS) ×2 IMPLANT
SYR CONTROL 10ML LL (SYRINGE) IMPLANT
TOWEL OR 17X24 6PK STRL BLUE (TOWEL DISPOSABLE) ×2 IMPLANT
TUBE CONNECTING 20X1/4 (TUBING) ×2 IMPLANT
UNDERPAD 30X30 INCONTINENT (UNDERPADS AND DIAPERS) ×2 IMPLANT
WATER STERILE IRR 1000ML POUR (IV SOLUTION) IMPLANT

## 2012-12-05 NOTE — Op Note (Signed)
12/05/2012  2:34 PM  PATIENT:  Wanda Moore    PRE-OPERATIVE DIAGNOSIS:  left distal radius fracture  POST-OPERATIVE DIAGNOSIS:  Same  PROCEDURE:  ORIF DISTAL RADIUS FRACTURE, 3 PIECES  SURGEON:  Eulas Post, MD  PHYSICIAN ASSISTANT: none  ANESTHESIA:   General  PREOPERATIVE INDICATIONS:  [name] is a  52 y.o. female with a diagnosis of left distal radius fracture who elected for surgical management due to fracture displacement.    The risks benefits and alternatives were discussed with the patient preoperatively including but not limited to the risks of infection, bleeding, nerve injury, cardiopulmonary complications, the need for revision surgery, tendon rupture, hardware prominence, hardware failure, nonunion, malunion, post-traumatic arthritis, regional pain syndrome, among others, and the patient was willing to proceed.  OPERATIVE IMPLANTS: [employer]omet DVR volar plate with 2 proximal cortical screws and multiple distal interlocking smooth pegs, using the short standard plate.   OPERATIVE FINDINGS: Comminution of the distal radius fracture, particularly dorsally  OPERATIVE PROCEDURE: The patient was brought to the operating room and placed in the supine position. General anesthesia was administered. IV antibiotics were given. Time out was performed. The upper extremity was prepped and draped in usual sterile fashion. The arm was elevated and exsanguinated and the tourniquet was inflated at hg.    Volar approach to the distal radius was carried out, and the flexor carpi radialis was retracted radially. The radial artery was protected throughout the case.   Deep dissection was carried down, and the pronator quadratus was elevated off of the radius. The fracture site was identified and cleaned and reduced anatomically. This keyed into place nicely.   I held this provisionally with a K wire, and C-arm used to confirm alignment.   I had restored height and  inclination and then applied a volar plate. A K wire was used to confirm appropriate position of the plate, and once I was satisfied with the overall alignment I was able to secure the plate proximally with a cortical screw.   I then secured the fracture with multiple smooth interlocking pegs distally, and confirmed that none of these were in the joint, and none of these were penetrating the dorsal cortex. I also secured the plate proximally with one more cortical screw. On the ulnar side of the plate distally I used shorter pegs than on the radial side.    The wounds were irrigated copiously, and I repaired the pronator quadratus as well as possible with 2-0 Vicryl followed by nylon for the skin with adaptec and sterile gauze and a volar splint. The tourniquet was released. She was awakened and returned back in stable and satisfactory condition. There were no complications and She tolerated the procedure well.

## 2012-12-05 NOTE — Transfer of Care (Signed)
Immediate Anesthesia Transfer of Care Note  Patient: Wanda Moore  Procedure(s) Performed: Procedure(s) (LRB) with comments: OPEN REDUCTION INTERNAL FIXATION (ORIF) DISTAL RADIAL FRACTURE (Left)  Patient Location: PACU  Anesthesia Type:GA combined with regional for post-op pain  Level of Consciousness: sedated  Airway & Oxygen Therapy: Patient Spontanous Breathing and Patient connected to face mask oxygen  Post-op Assessment: Report given to PACU RN and Post -op Vital signs reviewed and stable  Post vital signs: Reviewed and stable  Complications: No apparent anesthesia complications

## 2012-12-05 NOTE — H&P (Signed)
PREOPERATIVE H&P  Chief Complaint: left distal radius fracture  HPI: Wanda Moore is a 52 y.o. female who presents for preoperative history and physical with a diagnosis of left distal radius fracture. Symptoms are rated as moderate to severe, and have been worsening.  This is significantly impairing activities of daily living.  She has elected for surgical management. This happened while at work, and she had a fall on ice in the parking lot. She was seen in the office yesterday, and was neurologically intact.  Past Medical History  Diagnosis Date  . Esophageal reflux   . Diverticulosis of colon (without mention of hemorrhage)   . Recurrent UTI   . Barrett esophagus   . Hiatal hernia   . Stricture and stenosis of esophagus   . Cough 08-03-2012     nonproductive  . Melanoma     removed from betwen shoulder blades   Past Surgical History  Procedure Date  . Cesarean section   . Melanoma excision     back  . Cholecystectomy   . Laparoscopic nissen fundoplication 08/09/2012    Procedure: LAPAROSCOPIC NISSEN FUNDOPLICATION;  Surgeon: Valarie Merino, MD;  Location: [facility];  Service: General;  Laterality: N/A;  HIATAL HERNIA REPAIR   History   Social History  . Marital Status: Married    Spouse Name: N/A    Number of Children: N/A  . Years of Education: N/A   Social History Main Topics  . Smoking status: Never Smoker   . Smokeless tobacco: Never Used  . Alcohol Use: Yes     Comment: socially  . Drug Use: No  . Sexually Active: Not on file   Other Topics Concern  . Not on file   Social History Narrative  . No narrative on file   Family History  Problem Relation Age of Onset  . Breast cancer Sister   . Cancer Sister     breast (half sister - same mother)  . Hypertension Mother   . Diabetes Mother   . Heart disease Mother   . Lung cancer Father   . Cancer Father     lung  . Colon cancer Neg Hx    Allergies  Allergen Reactions  . Buprenex (Buprenorphine)  Itching   Prior to Admission medications   Medication Sig Start Date End Date Taking? Authorizing Provider  Clobetasol Prop Emollient Base 0.05 % emollient cream Apply 1 application topically as needed. Do not use on face or genitals 05/23/12   Jonita Albee, MD  estradiol-norethindrone (ACTIVELLA) 1-0.5 MG per tablet Take 1 tablet by mouth daily. mimvey    Historical Provider, MD  zolpidem (AMBIEN) 5 MG tablet Take 1 tablet (5 mg total) by mouth at bedtime as needed for sleep (may repeat times one if no result). 08/12/12   Letha Cape, PA     Positive ROS: All other systems have been reviewed and were otherwise negative with the exception of those mentioned in the HPI and as above.  Physical Exam: General: Alert, no acute distress Cardiovascular: No pedal edema Respiratory: No cyanosis, no use of accessory musculature GI: No organomegaly, abdomen is soft and non-tender Skin: No lesions in the area of chief complaint Neurologic: Sensation intact distally Psychiatric: Patient is competent for consent with normal mood and affect Lymphatic: No axillary or cervical lymphadenopathy  MUSCULOSKELETAL: Left wrist has gross angulation, sensation intact throughout the hand. All fingers flex extend and abduct. She has good capillary refill.  Assessment: left distal  radius fracture, displaced, comminuted  Plan: Plan for Procedure(s): OPEN REDUCTION INTERNAL FIXATION (ORIF) DISTAL RADIAL FRACTURE  The risks benefits and alternatives were discussed with the patient including but not limited to the risks of nonoperative treatment, versus surgical intervention including infection, bleeding, nerve injury, malunion, nonunion, the need for revision surgery, hardware prominence, hardware failure, the need for hardware removal, blood clots, cardiopulmonary complications, morbidity, mortality, among others, and they were willing to proceed.  We also discussed the risks of regional pain syndrome, tendon  rupture, posttraumatic arthritis, among others.  Tameron Lama P, MD Cell [phone] Pager [phone]  12/05/2012 11:00 AM

## 2012-12-05 NOTE — Anesthesia Preprocedure Evaluation (Signed)
Anesthesia Evaluation  Patient identified by MRN, date of birth, ID band Patient awake    Reviewed: Allergy & Precautions, H&P , NPO status , Patient's Chart, lab work & pertinent test results  Airway Mallampati: I TM Distance: >3 FB Neck ROM: Full    Dental   Pulmonary          Cardiovascular     Neuro/Psych    GI/Hepatic   Endo/Other    Renal/GU      Musculoskeletal   Abdominal   Peds  Hematology   Anesthesia Other Findings   Reproductive/Obstetrics                           Anesthesia Physical Anesthesia Plan  ASA: II  Anesthesia Plan: General   Post-op Pain Management:    Induction: Intravenous  Airway Management Planned: LMA  Additional Equipment:   Intra-op Plan:   Post-operative Plan: Extubation in OR  Informed Consent: I have reviewed the patients History and Physical, chart, labs and discussed the procedure including the risks, benefits and alternatives for the proposed anesthesia with the patient or authorized representative who has indicated his/her understanding and acceptance.     Plan Discussed with: CRNA and Surgeon  Anesthesia Plan Comments: (Pt had Nissen fundoplication in 2013. No reflux currently.)        Anesthesia Quick Evaluation

## 2012-12-05 NOTE — Anesthesia Procedure Notes (Addendum)
Anesthesia Regional Block:  Supraclavicular block  Pre-Anesthetic Checklist: ,, timeout performed, Correct Patient, Correct Site, Correct Laterality, Correct Procedure, Correct Position, site marked, Risks and benefits discussed,  Surgical consent,  Pre-op evaluation,  At surgeon's request and post-op pain management  Laterality: Left  Prep: chloraprep       Needles:  Injection technique: Single-shot  Needle Type: Echogenic Stimulator Needle     Needle Length: 5cm 5 cm     Additional Needles:  Procedures: ultrasound guided (picture in chart) and nerve stimulator Supraclavicular block  Nerve Stimulator or Paresthesia:  Response: 0.4 mA,   Additional Responses:   Narrative:  Start time: 12/05/2012 11:55 AM End time: 12/05/2012 12:05 PM Injection made incrementally with aspirations every 5 mL.  Performed by: Personally  Anesthesiologist: Arta Bruce MD  Additional Notes: Monitors applied. Patient sedated. Sterile prep and drape,hand hygiene and sterile gloves were used. Relevant anatomy identified.Needle position confirmed.Local anesthetic injected incrementally after negative aspiration. Local anesthetic spread visualized around nerve(s). Vascular puncture avoided. No complications. Image printed for medical record.The patient tolerated the procedure well.       Supraclavicular block Procedure Name: LMA Insertion Date/Time: 12/05/2012 1:12 PM Performed by: Gar Gibbon Pre-anesthesia Checklist: Patient identified, Emergency Drugs available, Suction available and Patient being monitored Patient Re-evaluated:Patient Re-evaluated prior to inductionOxygen Delivery Method: Circle System Utilized Preoxygenation: Pre-oxygenation with 100% oxygen Intubation Type: IV induction Ventilation: Mask ventilation without difficulty LMA: LMA inserted LMA Size: 4.0 Number of attempts: 1 Airway Equipment and Method: bite block Placement Confirmation: positive ETCO2 Tube secured  with: Tape Dental Injury: Teeth and Oropharynx as per pre-operative assessment

## 2012-12-05 NOTE — Anesthesia Postprocedure Evaluation (Signed)
Anesthesia Post Note  Patient: Wanda Moore  Procedure(s) Performed: Procedure(s) (LRB): OPEN REDUCTION INTERNAL FIXATION (ORIF) DISTAL RADIAL FRACTURE (Left)  Anesthesia type: general  Patient location: PACU  Post pain: Pain level controlled  Post assessment: Patient's Cardiovascular Status Stable  Last Vitals:  Filed Vitals:   12/05/12 1530  BP: 141/84  Pulse: 84  Temp:   Resp: 13    Post vital signs: Reviewed and stable  Level of consciousness: sedated  Complications: No apparent anesthesia complications

## 2012-12-05 NOTE — Progress Notes (Signed)
Assisted Dr. Ossey with left, ultrasound guided, supraclavicular block. Side rails up, monitors on throughout procedure. See vital signs in flow sheet. Tolerated Procedure well. 

## 2012-12-06 ENCOUNTER — Encounter (HOSPITAL_BASED_OUTPATIENT_CLINIC_OR_DEPARTMENT_OTHER): Payer: Self-pay | Admitting: Orthopedic Surgery

## 2013-02-11 ENCOUNTER — Encounter: Payer: Self-pay | Admitting: Internal Medicine

## 2013-02-13 ENCOUNTER — Encounter: Payer: Self-pay | Admitting: Internal Medicine

## 2013-05-01 ENCOUNTER — Ambulatory Visit (INDEPENDENT_AMBULATORY_CARE_PROVIDER_SITE_OTHER): Payer: BC Managed Care – PPO | Admitting: Internal Medicine

## 2013-05-01 ENCOUNTER — Encounter: Payer: Self-pay | Admitting: Internal Medicine

## 2013-05-01 ENCOUNTER — Other Ambulatory Visit (INDEPENDENT_AMBULATORY_CARE_PROVIDER_SITE_OTHER): Payer: BC Managed Care – PPO

## 2013-05-01 VITALS — BP 130/80 | HR 63 | Temp 98.0°F | Resp 16 | Ht 72.0 in | Wt 226.0 lb

## 2013-05-01 DIAGNOSIS — Z85820 Personal history of malignant melanoma of skin: Secondary | ICD-10-CM

## 2013-05-01 DIAGNOSIS — Z0000 Encounter for general adult medical examination without abnormal findings: Secondary | ICD-10-CM

## 2013-05-01 DIAGNOSIS — K222 Esophageal obstruction: Secondary | ICD-10-CM

## 2013-05-01 DIAGNOSIS — K2270 Barrett's esophagus without dysplasia: Secondary | ICD-10-CM

## 2013-05-01 DIAGNOSIS — S5290XD Unspecified fracture of unspecified forearm, subsequent encounter for closed fracture with routine healing: Secondary | ICD-10-CM

## 2013-05-01 DIAGNOSIS — S52502D Unspecified fracture of the lower end of left radius, subsequent encounter for closed fracture with routine healing: Secondary | ICD-10-CM

## 2013-05-01 DIAGNOSIS — K219 Gastro-esophageal reflux disease without esophagitis: Secondary | ICD-10-CM

## 2013-05-01 LAB — COMPREHENSIVE METABOLIC PANEL
ALT: 20 U/L (ref 0–35)
AST: 20 U/L (ref 0–37)
Albumin: 4.1 g/dL (ref 3.5–5.2)
Alkaline Phosphatase: 68 U/L (ref 39–117)
BUN: 10 mg/dL (ref 6–23)
CO2: 28 mEq/L (ref 19–32)
Calcium: 9.3 mg/dL (ref 8.4–10.5)
Chloride: 102 mEq/L (ref 96–112)
Creatinine, Ser: 0.5 mg/dL (ref 0.4–1.2)
GFR: 131.16 mL/min (ref >60.00)
Glucose, Bld: 90 mg/dL (ref 70–99)
Potassium: 4.3 mEq/L (ref 3.5–5.1)
Sodium: 136 mEq/L (ref 135–145)
Total Bilirubin: 0.5 mg/dL (ref 0.3–1.2)
Total Protein: 7.8 g/dL (ref 6.0–8.3)

## 2013-05-01 LAB — LIPID PANEL
Cholesterol: 209 mg/dL — ABNORMAL HIGH (ref 0–200)
HDL: 60.4 mg/dL (ref >39.00)
Total CHOL/HDL Ratio: 3
Triglycerides: 149 mg/dL (ref 0.0–149.0)
VLDL: 29.8 mg/dL (ref 0.0–40.0)

## 2013-05-01 LAB — LDL CHOLESTEROL, DIRECT: Direct LDL: 131 mg/dL

## 2013-05-01 NOTE — Progress Notes (Signed)
Subjective:    Patient ID: Wanda Moore, female    DOB: 1961-09-22, 52 y.o.   MRN: [id number]  HPI Wanda Moore - present for a general medical wellness exam. She is current with Gyn. In the interval since last she has had [employer] fundoplication; fractured radius left with ORIF but otherwise has been healthy.  She is current with dentist, current with optometry, current with mammography,current with colonoscopy.  Past Medical History  Diagnosis Date  . Esophageal reflux   . Diverticulosis of colon (without mention of hemorrhage)   . Recurrent UTI   . Barrett esophagus   . Hiatal hernia   . Stricture and stenosis of esophagus   . Cough 08-03-2012     nonproductive  . Melanoma     removed from betwen shoulder blades  . Distal radius fracture, left 12/05/2012  . Allergy    Past Surgical History  Procedure Laterality Date  . Cesarean section    . Melanoma excision      back  . Cholecystectomy    . Laparoscopic nissen fundoplication  08/09/2012    Procedure: LAPAROSCOPIC NISSEN FUNDOPLICATION;  Surgeon: Valarie Merino, MD;  Location: [facility];  Service: General;  Laterality: N/A;  HIATAL HERNIA REPAIR  . Open reduction internal fixation (orif) distal radial fracture  12/05/2012    Procedure: OPEN REDUCTION INTERNAL FIXATION (ORIF) DISTAL RADIAL FRACTURE;  Surgeon: Eulas Post, MD;  Location: [facility];  Service: Orthopedics;  Laterality: Left;  . Colonoscopy  03/25/07   Family History  Problem Relation Age of Onset  . Breast cancer Sister   . Cancer Sister     breast (half sister - same mother)  . Hypertension Mother   . Diabetes Mother   . Heart disease Mother   . Lung cancer Father   . Cancer Father     lung  . Colon cancer Neg Hx    History   Social History  . Marital Status: Married    Spouse Name: N/A    Number of Children: 2  . Years of Education: 14   Occupational History  . adm assistance [employer]   Social History Main Topics   . Smoking status: Never Smoker   . Smokeless tobacco: Never Used  . Alcohol Use: Yes     Comment: socially  . Drug Use: No  . Sexually Active: Not on file   Other Topics Concern  . Not on file   Social History Narrative   HSG, [employer] - 2 years. Married 1990. 2 dtrs - '92, '94. Marriage in good health.   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 Clobetasol Prop Emollient Base 0.05 % emollient cream Apply 1 application topically as needed. Do not use on face or genitals      . estradiol-norethindrone (ACTIVELLA) 1-0.5 MG per tablet Take 1 tablet by mouth daily. mimvey       No current facility-administered medications on file prior to visit.   \    Review of Systems Constitutional:  Negative for fever, chills, activity change and unexpected weight change.  HEENT:  Negative for hearing loss, ear pain, congestion, neck stiffness and postnasal drip. Negative for sore throat or swallowing problems. Negative for dental complaints.   Eyes: Negative for vision loss or change in visual acuity.  Respiratory: Negative for  chest tightness and wheezing. Negative for DOE.   Cardiovascular: Negative for chest pain or palpitations. No decreased exercise tolerance Gastrointestinal: No change in bowel habit. No bloating or gas. No reflux or indigestion Genitourinary: Negative for urgency, frequency, flank pain and difficulty urinating.  Musculoskeletal: Negative for myalgias, back pain, arthralgias and gait problem.  Neurological: Negative for dizziness, tremors, weakness and headaches.  Hematological: Negative for adenopathy.  Psychiatric/Behavioral: Negative for behavioral problems and dysphoric mood.       Objective:   Physical Exam Filed Vitals:   05/01/13 1449  BP: 130/80  Pulse: 63  Temp: 98 F (36.7 C)  Resp: 16   Wt Readings from Last 3 Encounters:  05/01/13 226 lb (102.513 kg)   12/05/12 227 lb 8 oz (103.193 kg)  12/05/12 227 lb 8 oz (103.193 kg)   Gen'l: well nourished, well developed, overweight Woman in no distress HEENT - [place]/AT, EACs/TMs normal, oropharynx with native dentition in good condition, no buccal or palatal lesions, posterior pharynx clear, mucous membranes moist. C&S clear, PERRLA, fundi - normal Neck - supple, no thyromegaly Nodes- negative submental, cervical, supraclavicular regions Chest - no deformity, no CVAT Lungs - clear without rales, wheezes. No increased work of breathing Breast - deferred to gyn Cardiovascular - regular rate and rhythm, quiet precordium, no murmurs, rubs or gallops, 2+ radial, DP and PT pulses Abdomen - BS+ x 4, no HSM, no guarding or rebound or tenderness Pelvic - deferred to gyn Rectal - deferred to gyn Extremities - no clubbing, cyanosis, edema or deformity.  Neuro - A&O x 3, CN II-XII normal, motor strength normal and equal, DTRs 2+ and symmetrical biceps, radial, and patellar tendons. Cerebellar - no tremor, no rigidity, fluid movement and normal gait. Derm - Head, neck, back, abdomen and extremities without suspicious lesions  Recent Results (from the past 2160 hour(s))  COMPREHENSIVE METABOLIC PANEL     Status: None   Collection Time    05/01/13  3:58 PM      Result Value Range   Sodium 136  135 - 145 mEq/L   Potassium 4.3  3.5 - 5.1 mEq/L   Chloride 102  96 - 112 mEq/L   CO2 28  19 - 32 mEq/L   Glucose, Bld 90  70 - 99 mg/dL   BUN 10  6 - 23 mg/dL   Creatinine, Ser 0.5  0.4 - 1.2 mg/dL   Total Bilirubin 0.5  0.3 - 1.2 mg/dL   Alkaline Phosphatase 68  39 - 117 U/L   AST 20  0 - 37 U/L   ALT 20  0 - 35 U/L   Total Protein 7.8  6.0 - 8.3 g/dL   Albumin 4.1  3.5 - 5.2 g/dL   Calcium 9.3  8.4 - 96.0 mg/dL   GFR 454.09  >81.19 mL/min  LIPID PANEL     Status: Abnormal   Collection Time    05/01/13  3:58 PM      Result Value Range   Cholesterol 209 (*) 0 - 200 mg/dL   Comment: ATP III Classification        Desirable:  < 200 mg/dL               Borderline High:  200 - 239 mg/dL          High:  > = 147 mg/dL   Triglycerides 829.5  0.0 - 149.0 mg/dL   Comment: Normal:  <621 mg/dLBorderline High:  150 - 199 mg/dL  HDL 60.40  >39.00 mg/dL   VLDL 78.2  0.0 - 95.6 mg/dL   Total CHOL/HDL Ratio 3     Comment:                Men          Women1/2 Average Risk     3.4          3.3Average Risk          5.0          4.42X Average Risk          9.6          7.13X Average Risk          15.0          11.0                      LDL CHOLESTEROL, DIRECT     Status: None   Collection Time    05/01/13  3:58 PM      Result Value Range   Direct LDL 131.0     Comment: Optimal:  <100 mg/dLNear or Above Optimal:  100-129 mg/dLBorderline High:  130-159 mg/dLHigh:  160-189 mg/dLVery High:  >190 mg/dL           Assessment & Plan:

## 2013-05-01 NOTE — Assessment & Plan Note (Signed)
Sees Dr. Terri Piedra annually - no recurrence.

## 2013-05-01 NOTE — Patient Instructions (Addendum)
Thanks for coming to see me.  You look like your are doing well - making a good come back both GI and ortho.  You are current on all health maintenance issues including immunizations.  We will check basic lab and results will be available on MyChart an in the note I send you.  You should have a general medical exam in 2-3 years as long as you are keeping up with Gyn.

## 2013-05-01 NOTE — Assessment & Plan Note (Signed)
Markedly improved reflux.

## 2013-05-01 NOTE — Assessment & Plan Note (Signed)
January '14 - ORIF left radius - Dr. Dion Saucier.

## 2013-05-01 NOTE — Assessment & Plan Note (Signed)
Doing well now with less reflux

## 2013-05-01 NOTE — Assessment & Plan Note (Signed)
Last EGD June 13 - Dr. Jarold Motto

## 2013-05-04 DIAGNOSIS — Z0000 Encounter for general adult medical examination without abnormal findings: Secondary | ICD-10-CM | POA: Insufficient documentation

## 2013-05-04 NOTE — Assessment & Plan Note (Signed)
Interval history remarkable for fractured left wrist with ORIF and [employer] fundoplication, which has relieved her reflux. She is current with colorectal cancer screening and breast cancer screening. She is current with her gynecologist. Labs are reviewed: cholesterol is just at goal per NCEP ATP III guidelines - prudent low fat diet is recommended but no medical therapy.  In summary  A nice woman who appears medically stable. She is encouraged to follow a good diet and to exercise on a regular basis. She will return as needed or in 2 years for general physical exam - may have Rx refills in the interval.

## 2013-09-04 ENCOUNTER — Telehealth: Payer: Self-pay | Admitting: Gastroenterology

## 2013-09-04 NOTE — Telephone Encounter (Signed)
Pt seen last year for Hx of peptic stricture, had EGD with dil and was finally referred to Dr Wenda Low and Lap Nissen Fundoplication done 08/09/12. Pt reports everything has been good , but last week and today, she developed pain in the center of her chest just below her neck. She eats and drinks fine, but when she takes a deep breath, the area is sore. Pt states this is the same s&s she was having prior to her Nissen. Pt was given an appt tomorrow but can't come; she will see Dr Jarold Motto.

## 2013-09-05 ENCOUNTER — Ambulatory Visit: Payer: Self-pay | Admitting: Gastroenterology

## 2013-09-09 ENCOUNTER — Ambulatory Visit (INDEPENDENT_AMBULATORY_CARE_PROVIDER_SITE_OTHER): Payer: BC Managed Care – PPO | Admitting: Gastroenterology

## 2013-09-09 ENCOUNTER — Encounter: Payer: Self-pay | Admitting: Gastroenterology

## 2013-09-09 ENCOUNTER — Other Ambulatory Visit: Payer: Self-pay

## 2013-09-09 VITALS — BP 124/78 | HR 77 | Ht 72.0 in | Wt 237.4 lb

## 2013-09-09 DIAGNOSIS — R079 Chest pain, unspecified: Secondary | ICD-10-CM

## 2013-09-09 DIAGNOSIS — Z1231 Encounter for screening mammogram for malignant neoplasm of breast: Secondary | ICD-10-CM

## 2013-09-09 DIAGNOSIS — Z9889 Other specified postprocedural states: Secondary | ICD-10-CM

## 2013-09-09 MED ORDER — SUCRALFATE 1 GM/10ML PO SUSP: 1.0000 g | Freq: Four times a day (QID) | ORAL | Status: DC

## 2013-09-09 MED ORDER — TRAMADOL HCL 50 MG PO TABS: 50.0000 mg | ORAL_TABLET | Freq: Four times a day (QID) | ORAL | Status: DC | PRN

## 2013-09-09 NOTE — Progress Notes (Signed)
This is a 52 year old Caucasian female one year status post laparoscopic cholecystectomy cause of a large hiatal hernia and acid reflux problems. She's been doing well on  any problems to the last week when she's had recurrent left precordial chest pain lasting anywhere from a few minutes to a few hours in duration without real precipitating or alleviating elements. There no real cardiac symptoms otherwise or GI symptoms. Patient does not smoke or use alcohol or NSAIDs. She has no reflux symptoms, dysphagia, hepatobiliary complaints. There is no history of hypertension family history of cardiovascular disease. She denies cough, sputum production, hemoptysis, abdominal pain, bowel or regularity, melena or hematochezia.  Current Medications, Allergies, Past Medical History, Past Surgical History, Family History and Social History were reviewed in [name] record.  ROS: All systems were reviewed and are negative unless otherwise stated in the HPI.          Physical Exam: Healthy-appearing patient in no acute distress. Blood pressure 124/78, pulse 77, and weight 237 with a BMI of 32.19. I cannot appreciate stigmata of chronic liver disease. Her chest is entirely clear, she's a regular rhythm without murmurs gallops or rubs. I cannot appreciate hepatosplenomegaly, abdominal masses or tenderness. Bowel sounds are normal. Mental status is normal. Peripheral extremities are unremarkable    Assessment and Plan: New-onset left precordial chest pain and a middle-aged patient who is somewhat overweight but has no other cardiovascular risk factors. I still am concerned she may be having ischemic heart disease and have scheduled her to see cardiology ASAP. If she has a severe episode of pain or pain that does not last more than a few minutes, I've advised her to go to the emergency room. We tried to get her cardiology appointment today or tomorrow but were unsuccessful. I have empirically  placed her on Carafate suspension 1 tablespoon every 2-4 hours and when necessary by mouth tramadol 50 mg every 6-8 hours. She cardiac workup be unremarkable, I will consider repeating her endoscopy and perhaps high resolution esophageal manometry.

## 2013-09-09 NOTE — Patient Instructions (Addendum)
We have sent the following medications to your pharmacy for you to pick up at your convenience: Carafate Suspension, please take as directed  Tramadol 50 mg, please take one tablet every six to eight hours as needed for pain   You have been scheduled with Cardiology on 09-11-2013 at 915 am. Please arrive fifteen minutes early for registration.   PLEASE GO TO THE EMERGENCY ROOM IF PAIN BECOMES WORSE OR IF YOU EXPERIENCE SHORTNESS OF BREATH

## 2013-09-10 ENCOUNTER — Encounter (HOSPITAL_COMMUNITY): Payer: Self-pay | Admitting: Emergency Medicine

## 2013-09-10 ENCOUNTER — Emergency Department (HOSPITAL_COMMUNITY)
Admission: EM | Admit: 2013-09-10 | Discharge: 2013-09-10 | Disposition: A | Discharge status: Home / Self Care | Payer: BC Managed Care – PPO | Attending: Emergency Medicine | Admitting: Emergency Medicine

## 2013-09-10 DIAGNOSIS — Z8781 Personal history of (healed) traumatic fracture: Secondary | ICD-10-CM | POA: Insufficient documentation

## 2013-09-10 DIAGNOSIS — Z79899 Other long term (current) drug therapy: Secondary | ICD-10-CM | POA: Insufficient documentation

## 2013-09-10 DIAGNOSIS — R5381 Other malaise: Secondary | ICD-10-CM | POA: Insufficient documentation

## 2013-09-10 DIAGNOSIS — K219 Gastro-esophageal reflux disease without esophagitis: Secondary | ICD-10-CM | POA: Insufficient documentation

## 2013-09-10 DIAGNOSIS — Z85820 Personal history of malignant melanoma of skin: Secondary | ICD-10-CM | POA: Insufficient documentation

## 2013-09-10 DIAGNOSIS — R42 Dizziness and giddiness: Secondary | ICD-10-CM | POA: Insufficient documentation

## 2013-09-10 DIAGNOSIS — M542 Cervicalgia: Secondary | ICD-10-CM | POA: Insufficient documentation

## 2013-09-10 DIAGNOSIS — Z87440 Personal history of urinary (tract) infections: Secondary | ICD-10-CM | POA: Insufficient documentation

## 2013-09-10 NOTE — ED Provider Notes (Signed)
Medical screening examination/treatment/procedure(s) were conducted as a shared visit with non-physician practitioner(s) and myself.  I personally evaluated the patient during the encounter.  EKG Interpretation     Ventricular Rate:  67 PR Interval:  171 QRS Duration: 98 QT Interval:  455 QTC Calculation: 480 R Axis:   37 Text Interpretation:  Sinus rhythm            52 year old female with left-sided neck pain beginning today. She states it began with a feeling of discomfort traveling up her neck on the left.  She then developed left-sided neck pain and swelling. She has had some mild lightheadedness earlier today, but not now.  On exam, normal neck appearance, tender to palpation over the superior aspect of her left sternocleidomastoid, no appreciable swelling, normal carotid pulses without bruit, no significant lymphadenopathy, full range of motion of neck with some pain when turning head to the right, normal dentition without tenderness, normal auditory canal and TMs bilaterally, normal neurologic exam including normal coordination with finger to nose and gait, no truncal ataxia, normal strength and sensation bilateral upper extremities.  I think the discomfort in her neck is most likely coming from her left sternocleidomastoid. Her presentation is very atypical for carotid dissection, aneurysm, infection, venous thrombosis, SVC syndrome, or ACS.  We discussed obtained CT imaging, but decided to defer this.  She will follow up closely with cardiology tomorrow and her PCP.    Clinical Impression: 1. Neck pain       Candyce Churn, MD 09/10/13 1550

## 2013-09-10 NOTE — ED Provider Notes (Signed)
CSN: [id number]     Arrival date & time 09/10/13  1208 History   First MD Initiated Contact with Patient 09/10/13 1338     Chief Complaint  Patient presents with  . Neck Pain   (Consider location/radiation/quality/duration/timing/severity/associated sxs/prior Treatment) HPI Pt is a 52yo female with hx of esophageal reflux, Barrett esophagus, hiatal hernia, and nissen fundoplication c/o left sided neck pain. Reports being seen in ED last week for chest pain, possibly due to esophagitis but was advised to have f/u appointment with cardiologist which is scheduled for tomorrow.  Today, pt reports left side of neck had a "weird" sensation this morning, states "it felt like blood was leaking up into my head" associated with lightheadedness, states she felt faint.  She also noticed left side of neck felt swollen and pt continued to feel weak.  Denies chest pain, SOB, diaphoresis, or headache. Denies hx of similar pain. Denies tooth or ear pain. Denies fever, n/v/d.  Past Medical History  Diagnosis Date  . Esophageal reflux   . Diverticulosis of colon (without mention of hemorrhage)   . Recurrent UTI   . Barrett esophagus   . Hiatal hernia   . Stricture and stenosis of esophagus   . Cough 08-03-2012     nonproductive  . Melanoma     removed from betwen shoulder blades  . Distal radius fracture, left 12/05/2012  . Allergy    Past Surgical History  Procedure Laterality Date  . Cesarean section    . Melanoma excision      back  . Cholecystectomy    . Laparoscopic nissen fundoplication  08/09/2012    Procedure: LAPAROSCOPIC NISSEN FUNDOPLICATION;  Surgeon: Valarie Merino, MD;  Location: [facility];  Service: General;  Laterality: N/A;  HIATAL HERNIA REPAIR  . Open reduction internal fixation (orif) distal radial fracture  12/05/2012    Procedure: OPEN REDUCTION INTERNAL FIXATION (ORIF) DISTAL RADIAL FRACTURE;  Surgeon: Eulas Post, MD;  Location: [facility];  Service: Orthopedics;   Laterality: Left;  . Colonoscopy  03/25/07   Family History  Problem Relation Age of Onset  . Breast cancer Sister   . Cancer Sister     breast (half sister - same mother)  . Hypertension Mother   . Diabetes Mother   . Heart disease Mother   . Lung cancer Father   . Cancer Father     lung  . Colon cancer Neg Hx    History  Substance Use Topics  . Smoking status: Never Smoker   . Smokeless tobacco: Never Used  . Alcohol Use: Yes     Comment: socially   OB History   Grav Para Term Preterm Abortions TAB SAB Ect Mult Living                 Review of Systems  Constitutional: Negative for fever, chills and diaphoresis.  Eyes: Negative for visual disturbance.  Respiratory: Negative for shortness of breath.   Cardiovascular: Negative for chest pain, palpitations and leg swelling.  Gastrointestinal: Negative for nausea.  Musculoskeletal: Positive for neck pain. Negative for arthralgias, back pain, gait problem and neck stiffness.  Neurological: Positive for light-headedness. Negative for headaches.  All other systems reviewed and are negative.    Allergies  Buprenex  Home Medications   Current Outpatient Rx  Name  Route  Sig  Dispense  Refill  . Clobetasol Prop Emollient Base 0.05 % emollient cream   Topical   Apply 1 application topically as  needed. Do not use on face or genitals         . ibuprofen (ADVIL,MOTRIN) 200 MG tablet   Oral   Take 400-800 mg by mouth every 6 (six) hours as needed for moderate pain.         Marland Kitchen sucralfate (CARAFATE) 1 GM/10ML suspension   Oral   Take 10 mLs (1 g total) by mouth 4 (four) times daily.   420 mL   1   . traMADol (ULTRAM) 50 MG tablet   Oral   Take 1 tablet (50 mg total) by mouth every 6 (six) hours as needed.   30 tablet   0    BP 151/106  Pulse 97  Temp(Src) 97.8 F (36.6 C) (Oral)  Resp 20  SpO2 99% Physical Exam  Nursing note and vitals reviewed. Constitutional: She appears well-developed and  well-nourished. No distress.  Pt lying comfortably in exam bed, NAD.   HENT:  Head: Normocephalic and atraumatic.  Eyes: Conjunctivae are normal. No scleral icterus.  Neck: Normal range of motion. Neck supple. No JVD present. No tracheal deviation present. No thyromegaly present.  Mild edema in neck at base of the angle of left jaw.  Mild tenderness.  Cardiovascular: Normal rate, regular rhythm, normal heart sounds and intact distal pulses.  Exam reveals no gallop and no friction rub.   No murmur heard. No carotid bruit appreciated.   Pulmonary/Chest: Effort normal and breath sounds normal. No stridor. No respiratory distress. She has no wheezes. She has no rales. She exhibits no tenderness.  Abdominal: Soft. Bowel sounds are normal. She exhibits no distension and no mass. There is no tenderness. There is no rebound and no guarding.  Musculoskeletal: Normal range of motion.  Lymphadenopathy:    She has no cervical adenopathy.  Neurological: She is alert.  Skin: Skin is warm and dry. She is not diaphoretic.    ED Course  Procedures (including critical care time) Labs Review Labs Reviewed - No data to display Imaging Review No results found.  EKG Interpretation     Ventricular Rate:  67 PR Interval:  171 QRS Duration: 98 QT Interval:  455 QTC Calculation: 480 R Axis:   37 Text Interpretation:  Sinus rhythm            MDM   1. Neck pain    Pt c/o left sided neck pain and swelling associated with lightheadedness but denies headache, chest pain, SOB, difficulty swallowing or breathing.  On exam, mild edema over left SCM with tenderness but no evidence of cellulitis.   No carotid bruits.  Denies fever, n/v/d.  Reports f/u appointment with cardiologist tomorrow.    EKG: NSR  Discussed pt with Dr. Loretha Stapler, do not believe emergent process taking place at this time. Will discharge pt home and have her f/u as scheduled tomorrow with cardiologist and f/u as needed with PCP.  Return precautions provided. Pt verbalized understanding and agreement with tx plan.    Junius Finner, PA-C 09/10/13 1547

## 2013-09-10 NOTE — ED Notes (Addendum)
Around 9:00, pt felt odd sensation in left side of neck, felt faint and then noticed left side of neck swollen, continued to feel faint, a week ago had chest pain, has appt with cardiologist tomorrow and was told to come into ED, states now pt  C/o stiffness and tightness in neck, denies pain, but discomfort

## 2013-09-10 NOTE — ED Provider Notes (Signed)
Medical screening examination/treatment/procedure(s) were conducted as a shared visit with non-physician practitioner(s) and myself.  I personally evaluated the patient during the encounter.   Please see my separate note.     Candyce Churn, MD 09/10/13 [phone]

## 2013-09-11 ENCOUNTER — Encounter: Payer: Self-pay | Admitting: Cardiology

## 2013-09-11 ENCOUNTER — Other Ambulatory Visit: Payer: Self-pay

## 2013-09-11 ENCOUNTER — Ambulatory Visit (INDEPENDENT_AMBULATORY_CARE_PROVIDER_SITE_OTHER): Payer: BC Managed Care – PPO | Admitting: Cardiology

## 2013-09-11 VITALS — BP 133/83 | HR 72 | Ht 72.0 in | Wt 235.4 lb

## 2013-09-11 DIAGNOSIS — R079 Chest pain, unspecified: Secondary | ICD-10-CM

## 2013-09-11 DIAGNOSIS — M542 Cervicalgia: Secondary | ICD-10-CM

## 2013-09-11 NOTE — Progress Notes (Signed)
[address] [place], [place]  [address] Phone: [phone] Fax:  [phone]  Date:  09/11/2013   ID:  Wanda Moore, DOB 07-19-1961, MRN [id number]  PCP:  Illene Regulus, MD   History of Present Illness: Wanda Moore is a 52 y.o. female who reported to emergency department on 09/10/13 with neck pain/chest pain. She began with a feeling of discomfort traveling up her neck on the left side. She had what she describes as left-sided neck pain/swelling. Felt like something had "burst" and was moving up the neck. She thought personally that her face was swelling.  She may have had some mild lightheadedness as well. It was thought that her presentation is very atypical for dissection/aneurysm/infection/thrombosis or ACS. She is here today to discuss. No CT imaging was performed. EKG, personally reviewed from this encounter, was sinus rhythm with no ST segment changes.no lab work was performed yesterday.  She had GI visit, Nissen, Barrets, Dr. Eloise Harman. Last Thursday pain/cramp in chest. Mid chest. Could feel it like a catch or cramp, like a muscle pull. Would be quick in duration. Several times occurred on Thursday. At work. He wanted Cards evaluation. No further CP. No SOB. No diaphoresis. No prior heart testing.    Wt Readings from Last 3 Encounters:  09/11/13 235 lb 6.4 oz (106.777 kg)  09/09/13 237 lb 6.4 oz (107.684 kg)  05/01/13 226 lb (102.513 kg)     Past Medical History  Diagnosis Date  . Esophageal reflux   . Diverticulosis of colon (without mention of hemorrhage)   . Recurrent UTI   . Barrett esophagus   . Hiatal hernia   . Stricture and stenosis of esophagus   . Cough 08-03-2012     nonproductive  . Melanoma     removed from betwen shoulder blades  . Distal radius fracture, left 12/05/2012  . Allergy     Past Surgical History  Procedure Laterality Date  . Cesarean section    . Melanoma excision      back  . Cholecystectomy    . Laparoscopic  nissen fundoplication  08/09/2012    Procedure: LAPAROSCOPIC NISSEN FUNDOPLICATION;  Surgeon: Valarie Merino, MD;  Location: [facility];  Service: General;  Laterality: N/A;  HIATAL HERNIA REPAIR  . Open reduction internal fixation (orif) distal radial fracture  12/05/2012    Procedure: OPEN REDUCTION INTERNAL FIXATION (ORIF) DISTAL RADIAL FRACTURE;  Surgeon: Eulas Post, MD;  Location: [facility];  Service: Orthopedics;  Laterality: Left;  . Colonoscopy  03/25/07    Current Outpatient Prescriptions  Medication Sig Dispense Refill  . Clobetasol Prop Emollient Base 0.05 % emollient cream Apply 1 application topically as needed. Do not use on face or genitals      . ibuprofen (ADVIL,MOTRIN) 200 MG tablet Take 400-800 mg by mouth every 6 (six) hours as needed for moderate pain.      Marland Kitchen sucralfate (CARAFATE) 1 GM/10ML suspension Take 10 mLs (1 g total) by mouth 4 (four) times daily.  420 mL  1  . traMADol (ULTRAM) 50 MG tablet Take 1 tablet (50 mg total) by mouth every 6 (six) hours as needed.  30 tablet  0   No current facility-administered medications for this visit.    Allergies:    Allergies  Allergen Reactions  . Buprenex [Buprenorphine] Itching    Social History:  The patient  reports that she has never smoked. She has never used smokeless  tobacco. She reports that she drinks alcohol. She reports that she does not use illicit drugs.   Family History  Problem Relation Age of Onset  . Breast cancer Sister   . Cancer Sister     breast (half sister - same mother)  . Hypertension Mother   . Diabetes Mother   . Heart disease Mother   . Lung cancer Father   . Cancer Father     lung  . Colon cancer Neg Hx     ROS:  Please see the history of present illness.   Denies fevers, chills, syncope, orthopnea, PND, rash, strokelike symptoms   All other systems reviewed and negative.   PHYSICAL EXAM: VS:  BP 133/83  Pulse 72  Ht 6' (1.829 m)  Wt 235 lb 6.4 oz (106.777 kg)   BMI 31.92 kg/m2 Well nourished, well developed, in no acute distress HEENT: normal, [place]/AT, EOMI Neck: no JVD, normal carotid upstroke, no bruit Cardiac:  normal S1, S2; RRR; no murmur Lungs:  clear to auscultation bilaterally, no wheezing, rhonchi or rales Abd: soft, nontender, no hepatomegaly, no bruits Ext: no edema, 2+ distal pulses Skin: warm and dry GU: deferred Neuro: no focal abnormalities noted, AAO x 3  EKG:  09/10/13-normal sinus rhythm, no ST segment changes     ASSESSMENT AND PLAN:  1. Chest pain-atypical chest pain, short, fleeting sensation, mid chest, no association with food, occurred at rest, not exertional. Likely musculoskeletal. Nonetheless, we will check an exercise treadmill test to ensure that there is no evidence of ischemia. 2. GERD-Dr. Jarold Motto. 3. Left-sided neck pain-resolved. Also possibly musculoskeletal. Exam today was unremarkable. Reassurance.  Signed, Donato Schultz, MD [facility]  09/11/2013 9:40 AM

## 2013-09-11 NOTE — Patient Instructions (Signed)
Your physician has requested that you have an exercise tolerance test. For further information please visit [email or web address] Please also follow instruction sheet, as given.  Your follow-up with Dr.Skians will be bast on test results.

## 2013-09-18 ENCOUNTER — Ambulatory Visit (INDEPENDENT_AMBULATORY_CARE_PROVIDER_SITE_OTHER): Payer: BC Managed Care – PPO | Admitting: Physician Assistant

## 2013-09-18 DIAGNOSIS — M542 Cervicalgia: Secondary | ICD-10-CM

## 2013-09-18 DIAGNOSIS — R079 Chest pain, unspecified: Secondary | ICD-10-CM

## 2013-09-18 NOTE — Progress Notes (Signed)
Exercise Treadmill Test  Pre-Exercise Testing Evaluation Rhythm: normal sinus  Rate: 71 bpm     Test  Exercise Tolerance Test Ordering MD: Donato Schultz, MD  Interpreting MD: Tereso Newcomer, PA-C  Unique Test No: 1  Treadmill:  1  Indication for ETT: chest pain - rule out ischemia  Contraindication to ETT: No   Stress Modality: exercise - treadmill  Cardiac Imaging Performed: non   Protocol: standard Bruce - maximal  Max BP:  214/87  Max MPHR (bpm):  167 85% MPR (bpm):  142  MPHR obtained (bpm):  155 % MPHR obtained:  92  Reached 85% MPHR (min:sec):  6:30 Total Exercise Time (min-sec):  8:00  Workload in METS:  10.1 Borg Scale: 17  Reason ETT Terminated:  patient's desire to stop    ST Segment Analysis At Rest: normal ST segments - no evidence of significant ST depression With Exercise: non-specific ST changes  Other Information Arrhythmia:  No Angina during ETT:  absent (0) Quality of ETT:  diagnostic  ETT Interpretation:  normal - no evidence of ischemia by ST analysis  Comments: Good exercise capacity. No chest pain. Normal BP response to exercise. Wandering artifact interferes with interpretation somewhat. No obvious ST changes to suggest ischemia.   Recommendations: F/u with Dr. Donato Schultz as directed. Signed,  Tereso Newcomer, PA-C   09/18/2013 4:45 PM

## 2013-09-19 NOTE — Progress Notes (Signed)
Reassuring exercise treadmill test. 

## 2013-10-14 ENCOUNTER — Ambulatory Visit
Admission: RE | Admit: 2013-10-14 | Discharge: 2013-10-14 | Disposition: A | Discharge status: Home / Self Care | Payer: BC Managed Care – PPO | Source: Ambulatory Visit

## 2013-10-14 DIAGNOSIS — Z1231 Encounter for screening mammogram for malignant neoplasm of breast: Secondary | ICD-10-CM

## 2013-10-17 ENCOUNTER — Other Ambulatory Visit: Payer: Self-pay | Admitting: Obstetrics and Gynecology

## 2013-10-21 ENCOUNTER — Other Ambulatory Visit: Payer: Self-pay | Admitting: Obstetrics and Gynecology

## 2013-10-21 DIAGNOSIS — R928 Other abnormal and inconclusive findings on diagnostic imaging of breast: Secondary | ICD-10-CM

## 2013-11-02 IMAGING — MG MM DIGITAL SCREENING BILAT W/ CAD
3 series · 3 of 11 positions shown · non-contrast
Comparison: Previous exams.

CLINICAL DATA: Screening.

DIGITAL BILATERAL SCREENING MAMMOGRAM WITH CAD
 DIGITAL BREAST TOMOSYNTHESIS
Digital breast tomosynthesis images are acquired in two
projections.  These images are reviewed in combination with the
digital mammogram, confirming the findings below.

[R MLO]
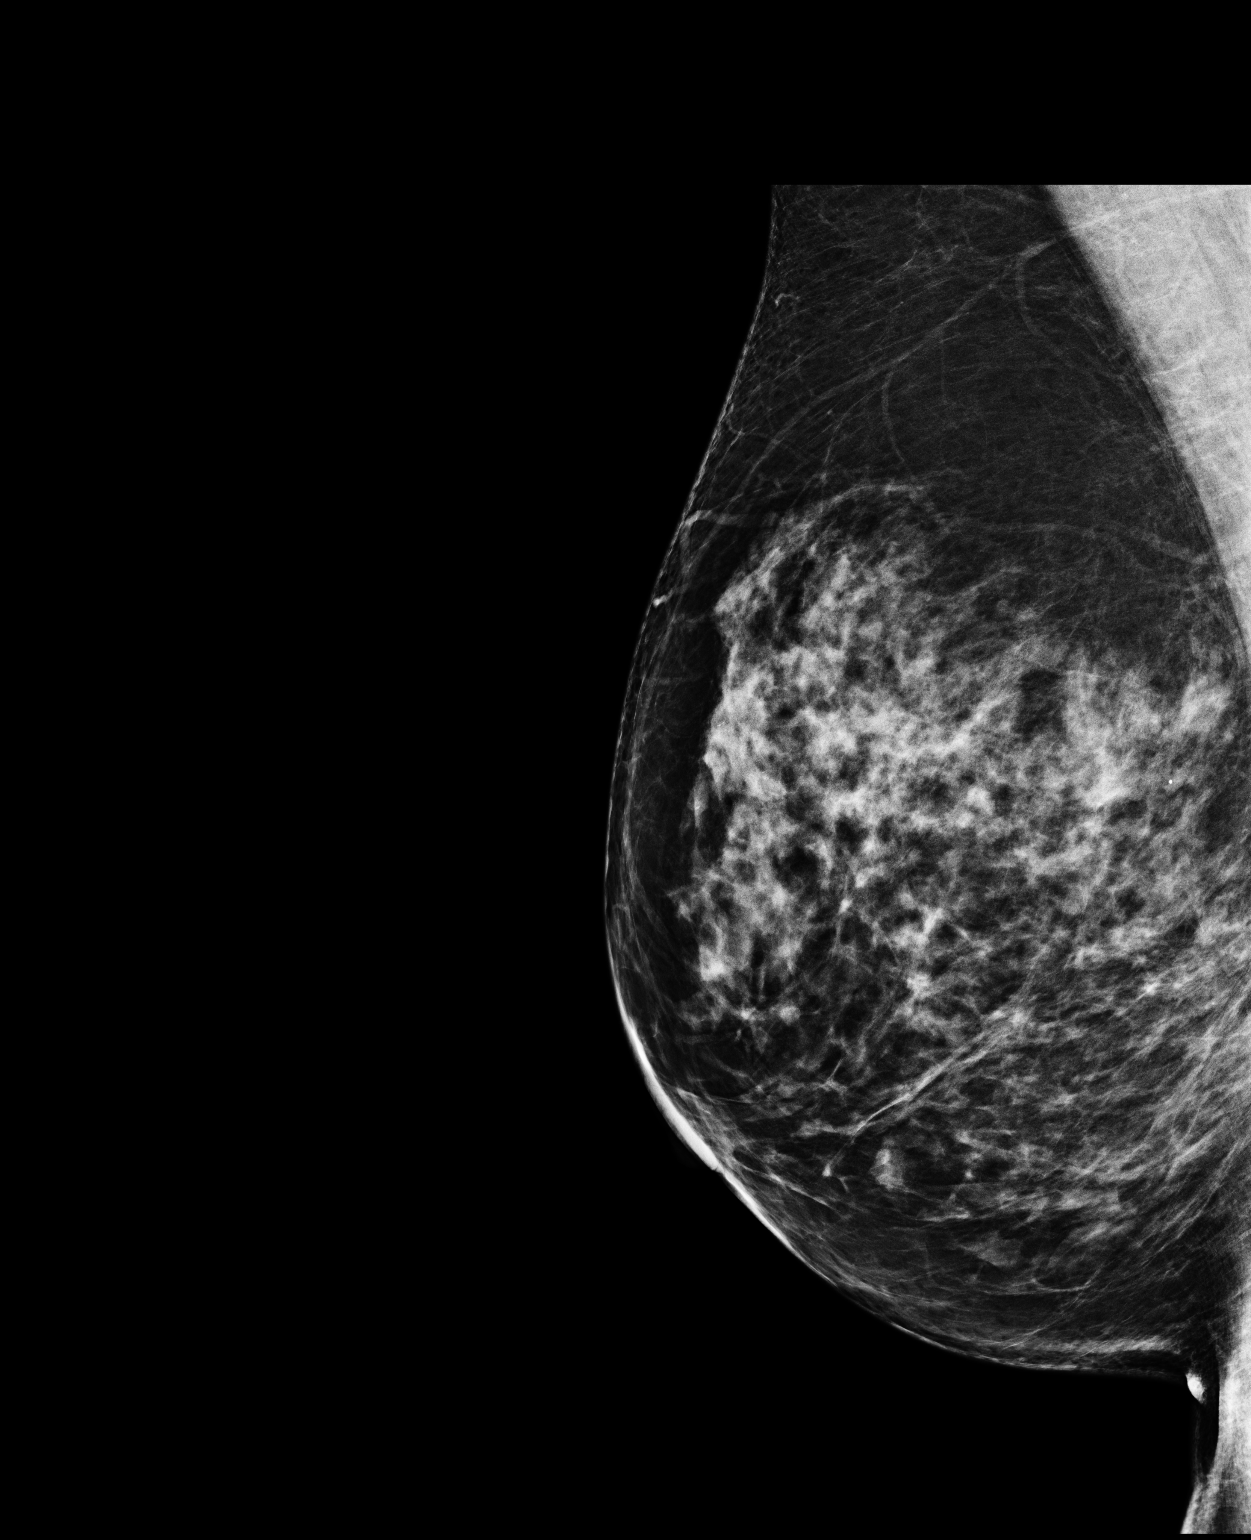

[RMLO COMBO BREAST TOMOSYNTHESIS IMAGE tomo · tomo slice 45/88.0]
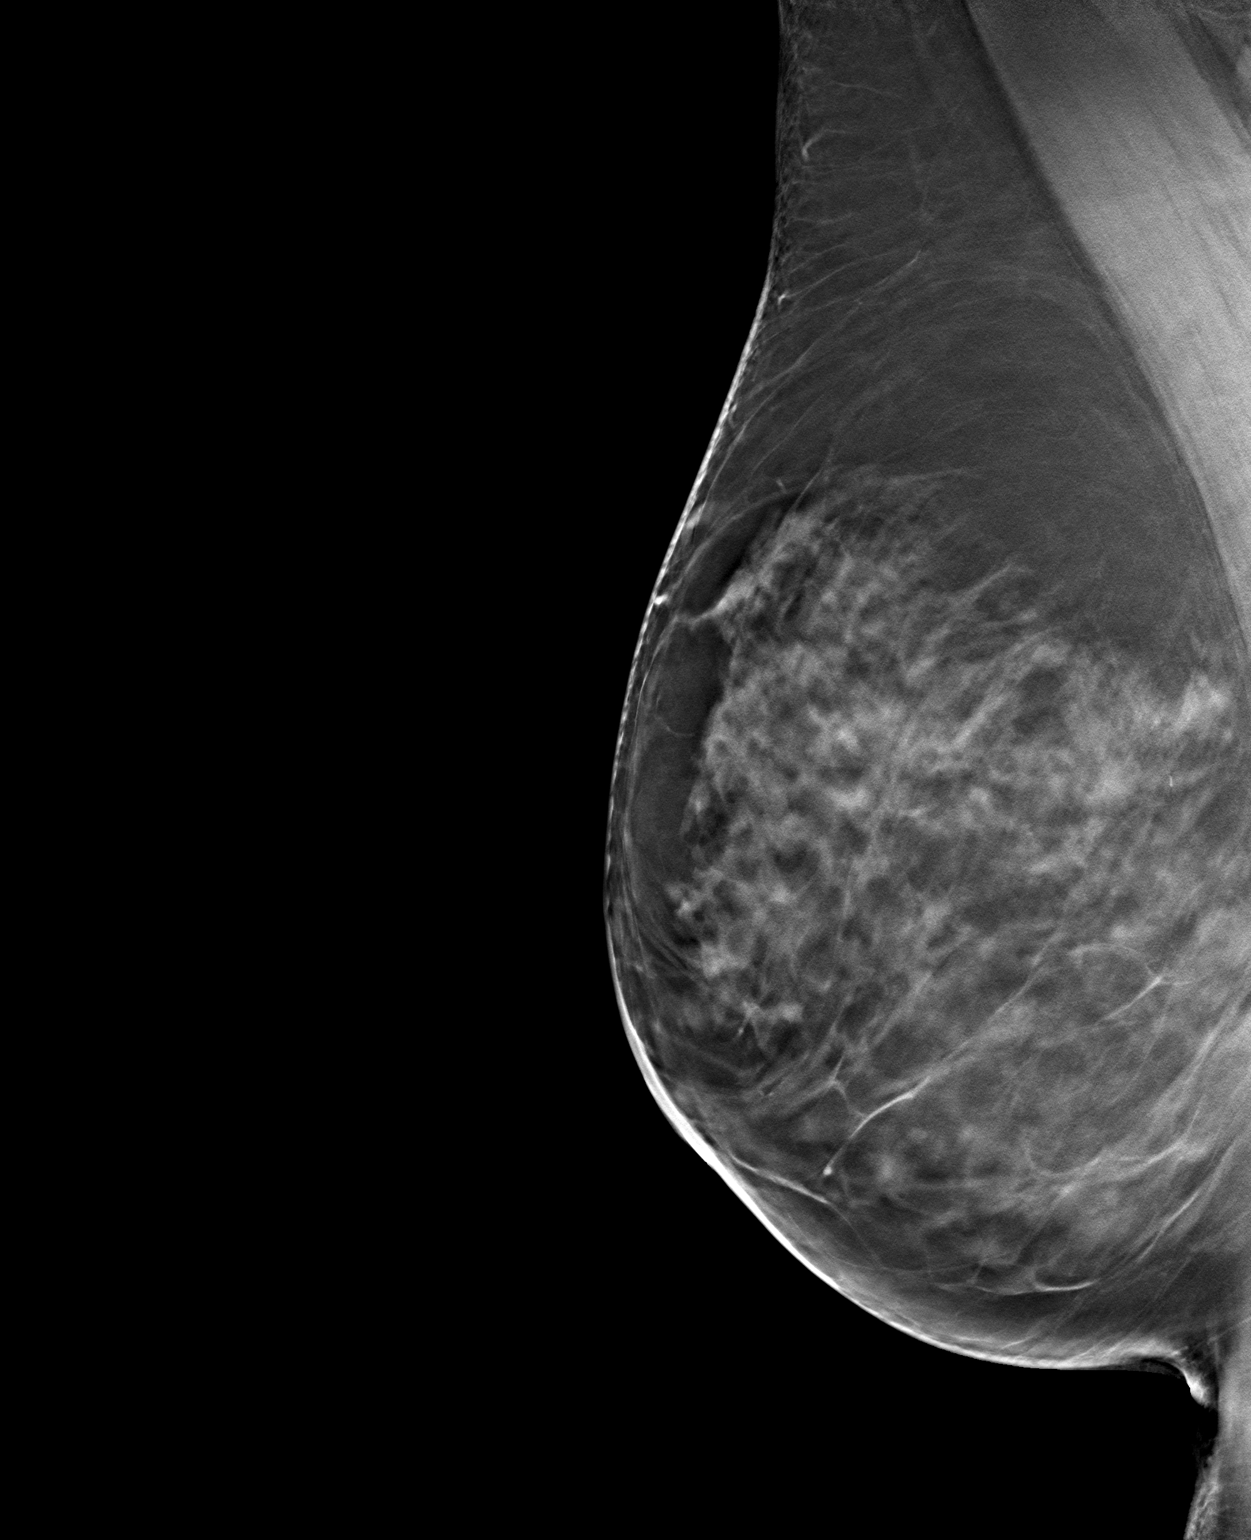

[LMLO COMBO BREAST TOMOSYNTHESIS IMAGE tomo · tomo slice 41/80.0]
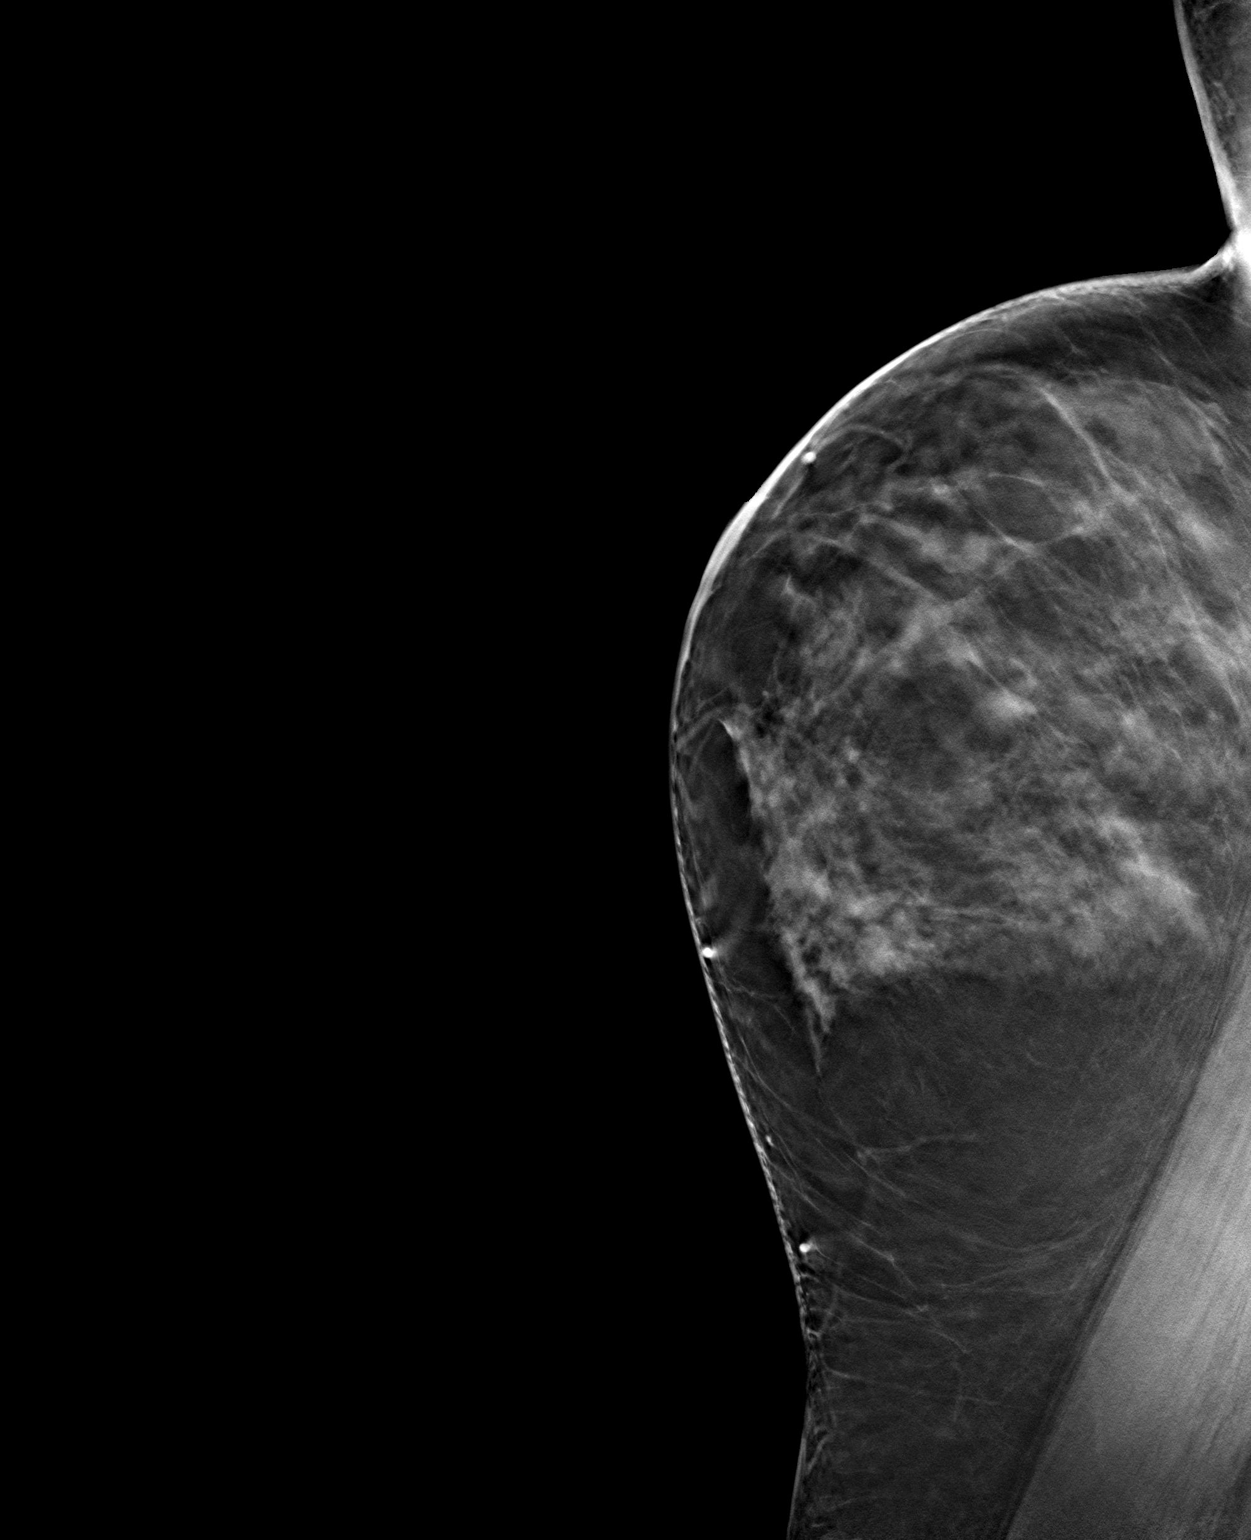

[3 of 11 positions shown; findings below may reference images not displayed]

FINDINGS: The breast tissue is heterogeneously dense. No
suspicious masses, architectural distortion, or calcifications are
present.

Images were processed with CAD.
IMPRESSION: No mammographic evidence of malignancy.

A result letter of this screening mammogram will be mailed directly
to the patient.

RECOMMENDATION:
Screening mammogram in one year. (Code:3X-V-29R)

BI-RADS CATEGORY 1:  Negative.

## 2013-11-04 ENCOUNTER — Ambulatory Visit
Admission: RE | Admit: 2013-11-04 | Discharge: 2013-11-04 | Disposition: A | Discharge status: Home / Self Care | Payer: BC Managed Care – PPO | Source: Ambulatory Visit | Attending: Obstetrics and Gynecology | Admitting: Obstetrics and Gynecology

## 2013-11-04 DIAGNOSIS — R928 Other abnormal and inconclusive findings on diagnostic imaging of breast: Secondary | ICD-10-CM

## 2013-11-07 IMAGING — CR DG CHEST 2V
2 series · 2 of 2 positions shown · non-contrast
Comparison: None available.

CLINICAL DATA: Preop for Nissen fundoplication.  Hiatal hernia.

CHEST - 2 VIEW

[w chest pa]
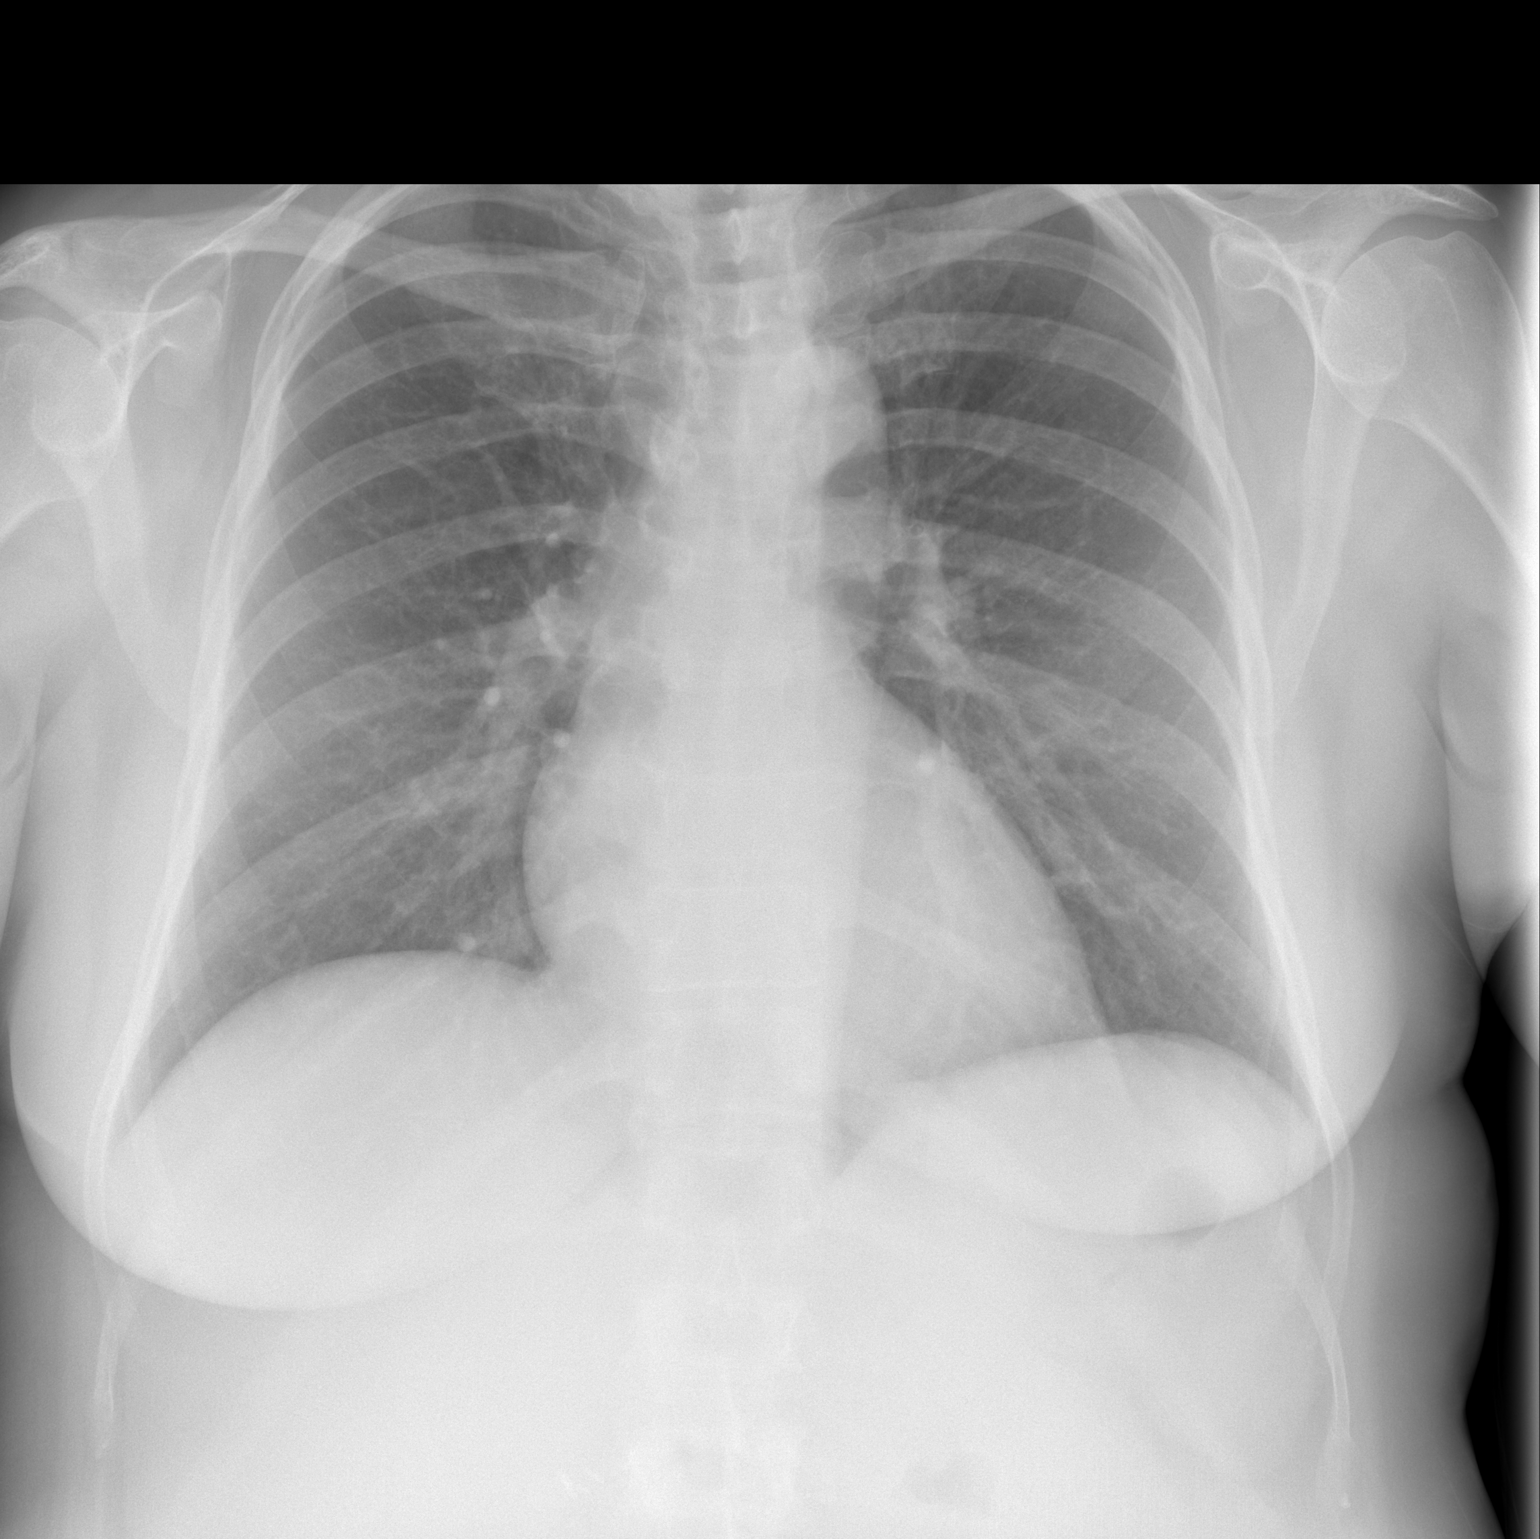

[w chest lat]
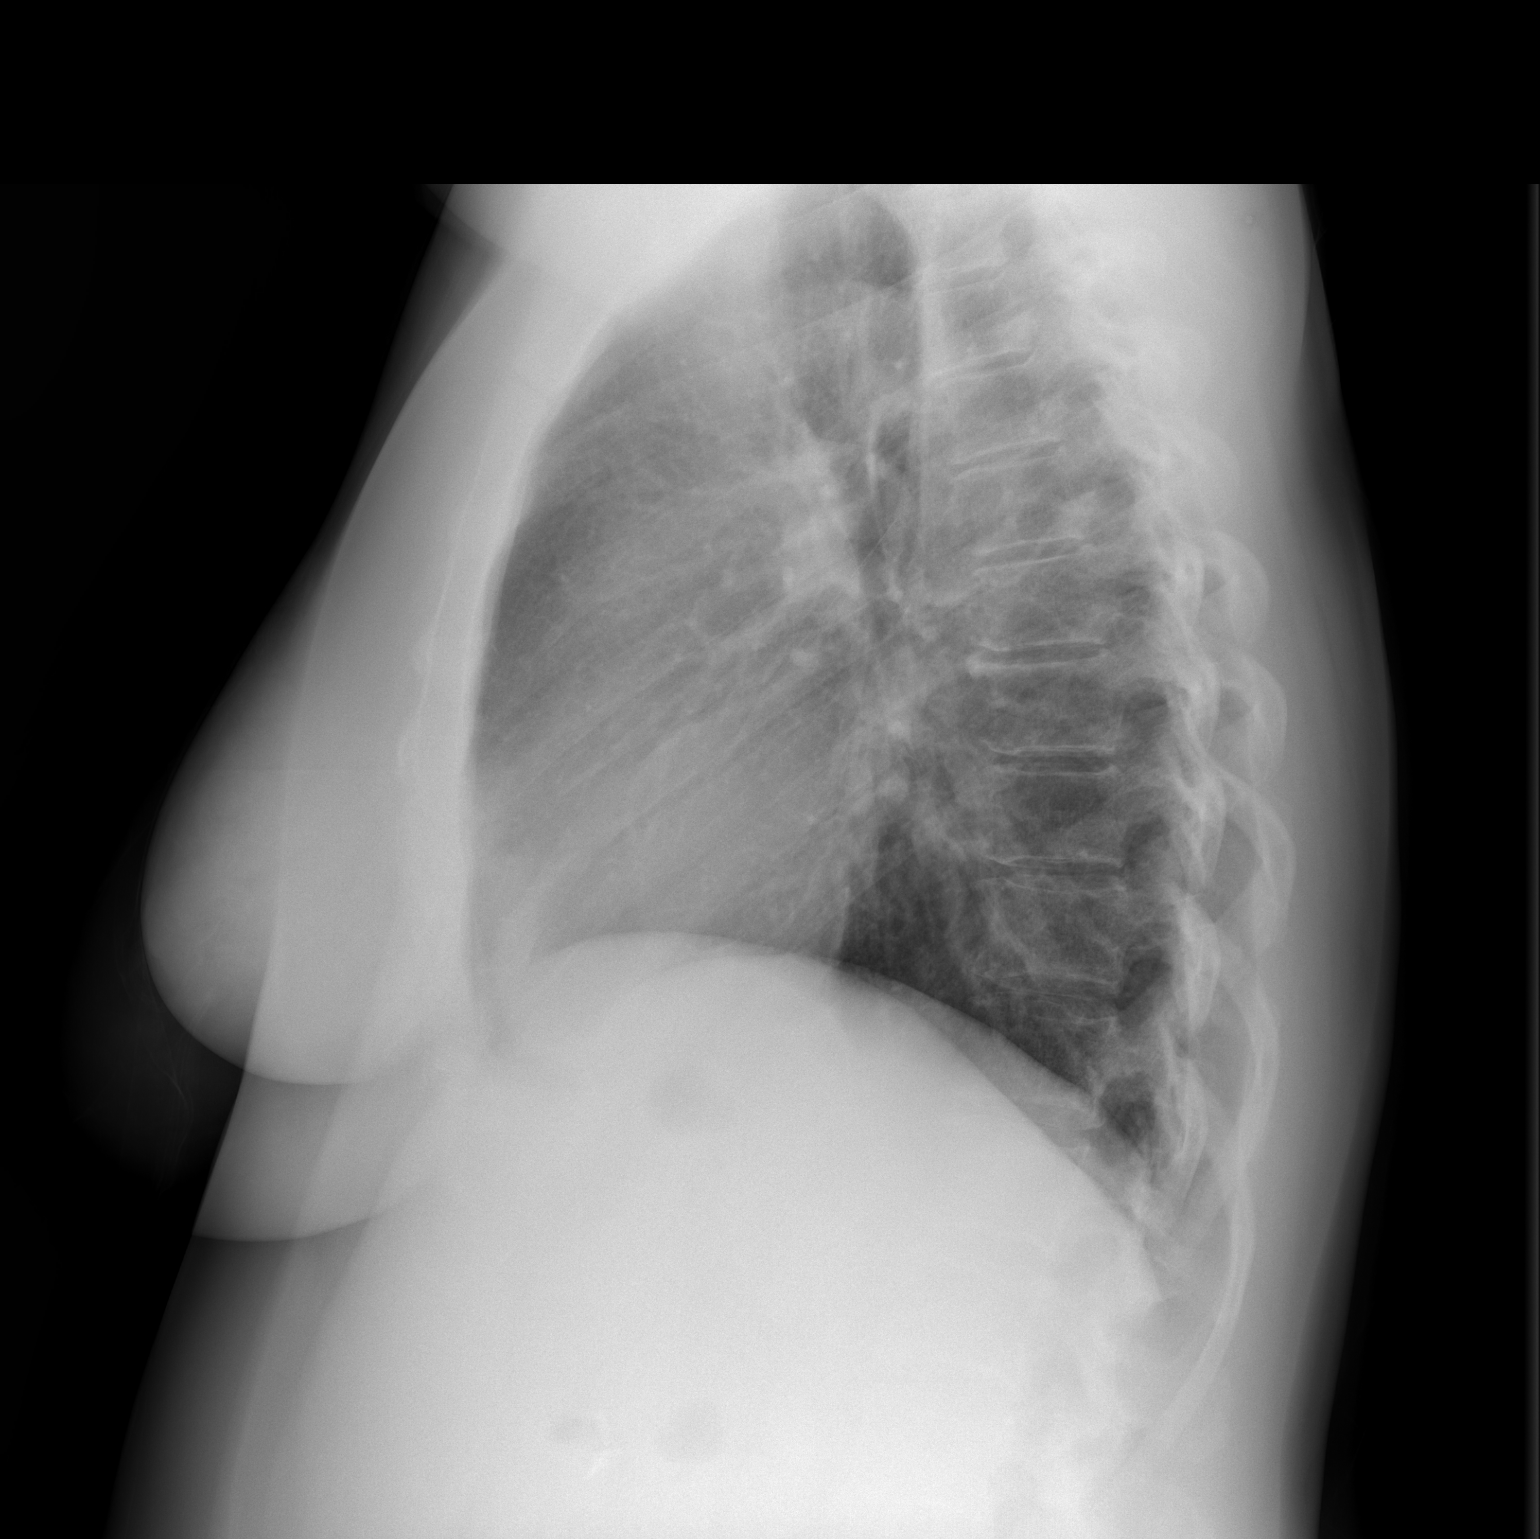

[2 of 2 positions shown; findings below may reference images not displayed]

FINDINGS: The heart size is normal.  The lungs are clear.  The
visualized soft tissues and bony thorax are unremarkable.
IMPRESSION: Negative chest.

## 2013-11-21 ENCOUNTER — Ambulatory Visit: Payer: BC Managed Care – PPO | Admitting: Family Medicine

## 2013-11-21 VITALS — BP 126/72 | HR 76 | Temp 98.9°F | Resp 16 | Ht 73.0 in | Wt 242.6 lb

## 2013-11-21 DIAGNOSIS — J069 Acute upper respiratory infection, unspecified: Secondary | ICD-10-CM

## 2013-11-21 MED ORDER — IPRATROPIUM BROMIDE 0.03 % NA SOLN: NASAL | Status: DC

## 2013-11-21 NOTE — Patient Instructions (Signed)
Drink plenty of fluids  Get enough rest  Use an over-the-counter antihistamine decongestant such as Claritin-D or Allegra-D or Zyrtec-D  Tylenol or ibuprofen for headaches or body aches.. the maximum acetaminophen (Tylenol) in 24 hours should not exceed 01-3999 mg.  Return if worse

## 2013-11-21 NOTE — Progress Notes (Signed)
Subjective: 53 year old lady who has developed upper respiratory congestion less than or 2. She's running a lot of stuff out of her nose and very congested. A lot of this been going around her office. She worked yesterday but doesn't feel up to working today. Her ears are okay. She's had a bad headache through the night. Her throat is not particularly sore. No cough or chest pain.  Objective: Pleasant lady in no major distress. Longer nose a lot. Her TMs are normal. Nose congested and tight. No sinus tenderness. Throat clear. Neck supple without nodes or thyromegaly. Chest is clear. Heart regular without murmurs.  Assessment Viral upper for infection  Plan: Symptomatic treatment

## 2014-04-15 ENCOUNTER — Telehealth: Payer: Self-pay | Admitting: Gastroenterology

## 2014-04-15 ENCOUNTER — Encounter: Payer: Self-pay | Admitting: *Deleted

## 2014-04-15 NOTE — Telephone Encounter (Signed)
Patient had a NISSEN 18 months ago.  She has  3 day history of epigastric pain/pressure.  She has been sleeping in the recliner due to the pain.  She will come in and see Alonza Bogus, PA tomorrow at 10:30

## 2014-04-15 NOTE — Telephone Encounter (Signed)
Patient is

## 2014-04-16 ENCOUNTER — Other Ambulatory Visit (INDEPENDENT_AMBULATORY_CARE_PROVIDER_SITE_OTHER): Payer: BC Managed Care – PPO

## 2014-04-16 ENCOUNTER — Encounter: Payer: Self-pay | Admitting: Gastroenterology

## 2014-04-16 ENCOUNTER — Ambulatory Visit (INDEPENDENT_AMBULATORY_CARE_PROVIDER_SITE_OTHER): Payer: BC Managed Care – PPO | Admitting: Gastroenterology

## 2014-04-16 VITALS — BP 112/76 | HR 69 | Ht 72.0 in | Wt 240.0 lb

## 2014-04-16 DIAGNOSIS — Z9889 Other specified postprocedural states: Secondary | ICD-10-CM

## 2014-04-16 DIAGNOSIS — R1013 Epigastric pain: Secondary | ICD-10-CM

## 2014-04-16 LAB — COMPREHENSIVE METABOLIC PANEL
ALT: 20 U/L (ref 0–35)
AST: 20 U/L (ref 0–37)
Albumin: 3.7 g/dL (ref 3.5–5.2)
Alkaline Phosphatase: 63 U/L (ref 39–117)
BUN: 10 mg/dL (ref 6–23)
CO2: 29 mEq/L (ref 19–32)
Calcium: 9.1 mg/dL (ref 8.4–10.5)
Chloride: 103 mEq/L (ref 96–112)
Creatinine, Ser: 0.6 mg/dL (ref 0.4–1.2)
GFR: 122.49 mL/min (ref >60.00)
Glucose, Bld: 77 mg/dL (ref 70–99)
Potassium: 3.9 mEq/L (ref 3.5–5.1)
Sodium: 138 mEq/L (ref 135–145)
Total Bilirubin: 0.3 mg/dL (ref 0.2–1.2)
Total Protein: 7.2 g/dL (ref 6.0–8.3)

## 2014-04-16 LAB — CBC
HCT: 36.7 % (ref 36.0–46.0)
Hemoglobin: 12.4 g/dL (ref 12.0–15.0)
MCHC: 33.8 g/dL (ref 30.0–36.0)
MCV: 85.3 fl (ref 78.0–100.0)
Platelets: 287 10*3/uL (ref 150.0–400.0)
RBC: 4.3 Mil/uL (ref 3.87–5.11)
RDW: 12.8 % (ref 11.5–15.5)
WBC: 9.8 10*3/uL (ref 4.0–10.5)

## 2014-04-16 LAB — AMYLASE: Amylase: 29 U/L (ref 27–131)

## 2014-04-16 LAB — LIPASE: Lipase: 20 U/L (ref 11.0–59.0)

## 2014-04-16 NOTE — Progress Notes (Signed)
     04/16/2014 Wanda Moore [id number] 03-17-1961   History of Present Illness:  This is a pleasant 52 year old female who is previously known to Dr. Sharlett Iles. She had an EGD in June 2013 at which time she was found to have a large hiatal hernia and a Schatzki's ring/peptic stricture that was dilated. She subsequently underwent a Nissen fundoplication 18 months ago.  She presents to our office today with complaints of epigastric abdominal pain that has been present just for the past 3 days intermittently. She describes somewhat like a "catching sensation" and sometimes it radiates into her back. It has been bothering her at night and states that she has to sleep sitting up. She reports it does not feel like heartburn or reflux. She is no longer PPI therapy. Up to this point she had no complications with her surgery. There are no complaints of difficulty or painful swallowing. Just of note, she had documented Barrett's esophagus on esophageal biopsy during EGD in April 2008, but no biopsies performed since that time.  Just of note, her last colonoscopy was in May 2008 which time was normal except for diverticulosis and repeat was recommended in 10 years from that time.   Current Medications, Allergies, Past Medical History, Past Surgical History, Family History and Social History were reviewed in [employer] record.   Physical Exam: BP 112/76  Pulse 69  Ht 6' (1.829 m)  Wt 240 lb (108.863 kg)  BMI 32.54 kg/m2 General: Well developed white female in no acute distress Head: Normocephalic and atraumatic Eyes:  Sclerae anicteric, conjunctiva pink  Ears: Normal auditory acuity Lungs: Clear throughout to auscultation Heart: Regular rate and rhythm Abdomen: Soft, non-distended.  Normal bowel sounds.  Mild epigastric TTP without R/R/G. Musculoskeletal: Symmetrical with no gross deformities  Extremities: No edema  Neurological: Alert oriented x 4, grossly  non-focal Psychological:  Alert and cooperative. Normal mood and affect  Assessment and Recommendations: -Epigastric abdominal pain in a patient with Nissen fundoplication just 18 months ago.  She describes this as a "catching sensation".  She does not have a gallbladder and this does not sound like reflux. Doubt pancreatitis as well. We will check labs including a CBC, CMP, amylase, and lipase, however. Will also start by checking a barium esophagram to assess the Nissen, etc.

## 2014-04-16 NOTE — Patient Instructions (Signed)
Your physician has requested that you go to the basement for the following lab work before leaving today: Lipase CBC Amylase CMET  You have been scheduled for a Barium Esophogram at [facility] (1st floor of the hospital) on 04-21-2014 at 1030 am. Please arrive 15 minutes prior to your appointment for registration. Make certain not to have anything to eat or drink 6 hours prior to your test. If you need to reschedule for any reason, please contact radiology at [phone] to do so. __________________________________________________________________ A barium swallow is an examination that concentrates on views of the esophagus. This tends to be a double contrast exam (barium and two liquids which, when combined, create a gas to distend the wall of the oesophagus) or single contrast (non-ionic iodine based). The study is usually tailored to your symptoms so a good history is essential. Attention is paid during the study to the form, structure and configuration of the esophagus, looking for functional disorders (such as aspiration, dysphagia, achalasia, motility and reflux) EXAMINATION You may be asked to change into a gown, depending on the type of swallow being performed. A radiologist and radiographer will perform the procedure. The radiologist will advise you of the type of contrast selected for your procedure and direct you during the exam. You will be asked to stand, sit or lie in several different positions and to hold a small amount of fluid in your mouth before being asked to swallow while the imaging is performed .In some instances you may be asked to swallow barium coated marshmallows to assess the motility of a solid food bolus. The exam can be recorded as a digital or video fluoroscopy procedure. POST PROCEDURE It will take 1-2 days for the barium to pass through your system. To facilitate this, it is important, unless otherwise directed, to increase your fluids for the next  24-48hrs and to resume your normal diet.  This test typically takes about 30 minutes to perform. __________________________________________________________________________________

## 2014-04-17 NOTE — Progress Notes (Signed)
Reviewed and agree with management plan.  Demari Kropp T. Armine Rizzolo, MD FACG 

## 2014-04-17 NOTE — Progress Notes (Signed)
Same with this patient.  Thank you,  Jess

## 2014-04-20 ENCOUNTER — Telehealth: Payer: Self-pay | Admitting: Gastroenterology

## 2014-04-20 NOTE — Telephone Encounter (Signed)
I would have her proceed with it to check the status of her Nissen.  Or she can call her surgeon's office and have them review my note and her chart to see if they think that she should have it done.  Thank you,  Jess

## 2014-04-20 NOTE — Telephone Encounter (Signed)
Patient given recommendation. 

## 2014-04-20 NOTE — Telephone Encounter (Signed)
Spoke with patient and she states since last Wednesday night, she has not had any symptoms at all. She is asking if she needs to do the barium esophagram tomorrow. Please, advise.

## 2014-04-21 ENCOUNTER — Ambulatory Visit (HOSPITAL_COMMUNITY): Payer: BC Managed Care – PPO

## 2014-07-22 ENCOUNTER — Ambulatory Visit (INDEPENDENT_AMBULATORY_CARE_PROVIDER_SITE_OTHER): Payer: BC Managed Care – PPO | Admitting: Emergency Medicine

## 2014-07-22 VITALS — BP 124/84 | HR 70 | Temp 98.7°F | Resp 16 | Ht 69.5 in | Wt 239.0 lb

## 2014-07-22 DIAGNOSIS — J018 Other acute sinusitis: Secondary | ICD-10-CM

## 2014-07-22 DIAGNOSIS — J209 Acute bronchitis, unspecified: Secondary | ICD-10-CM

## 2014-07-22 MED ORDER — PSEUDOEPHEDRINE-GUAIFENESIN ER 60-600 MG PO TB12: 1.0000 | ORAL_TABLET | Freq: Two times a day (BID) | ORAL | Status: DC

## 2014-07-22 MED ORDER — AMOXICILLIN-POT CLAVULANATE 875-125 MG PO TABS: 1.0000 | ORAL_TABLET | Freq: Two times a day (BID) | ORAL | Status: DC

## 2014-07-22 MED ORDER — PROMETHAZINE-CODEINE 6.25-10 MG/5ML PO SYRP: 5.0000 mL | ORAL_SOLUTION | Freq: Four times a day (QID) | ORAL | Status: DC | PRN

## 2014-07-22 NOTE — Patient Instructions (Signed)

## 2014-07-22 NOTE — Progress Notes (Signed)
[facility] [address] [place] [address] [phone]  Date:  07/22/2014   Name:  Wanda Moore   DOB:  1960-11-22   MRN:  [id number]  PCP:  Adella Hare, MD    Chief Complaint: Nasal Congestion and Sore Throat   History of Present Illness:  Wanda Moore is a 53 y.o. very pleasant female patient who presents with the following:  Ill with nasal congestion and purulent drainage.  Has post nasal drip and sinus pressure. Has sore throat.   No fever or chills Cough is non productive and worse when lays down.  No wheezing or shortness of breath. History of sinusitis No improvement with over the counter medications or other home remedies. Denies other complaint or health concern today.   Patient Active Problem List   Diagnosis Date Noted  . Abdominal pain, epigastric 04/16/2014  . History of Nissen fundoplication 41/66/0630  . Routine health maintenance 05/04/2013  . Distal radius fracture, left 12/05/2012  . NECK PAIN 01/05/2010  . ESOPHAGEAL STRICTURE 04/20/2008  . BARRETTS ESOPHAGUS 04/20/2008  . ALLERGIC RHINITIS 12/13/2007  . GERD 12/13/2007  . DIVERTICULOSIS, COLON 12/13/2007  . MELANOMA, TRUNK, HX OF 12/13/2007  . CHOLECYSTECTOMY, HX OF 07/16/2007    Past Medical History  Diagnosis Date  . Esophageal reflux   . Diverticulosis of colon (without mention of hemorrhage)   . Recurrent UTI   . Barrett esophagus 2008  . Hiatal hernia 2013    large  . Stricture and stenosis of esophagus 2013  . Cough 08-03-2012     nonproductive  . Melanoma     removed from betwen shoulder blades  . Distal radius fracture, left 12/05/2012  . Allergy     Past Surgical History  Procedure Laterality Date  . Cesarean section    . Melanoma excision      back  . Cholecystectomy    . Laparoscopic nissen fundoplication  16/0/1093    Procedure: LAPAROSCOPIC NISSEN FUNDOPLICATION;  Surgeon: Pedro Earls, MD;  Location: [facility];  Service: General;   Laterality: N/A;  HIATAL HERNIA REPAIR  . Open reduction internal fixation (orif) distal radial fracture  12/05/2012    Procedure: OPEN REDUCTION INTERNAL FIXATION (ORIF) DISTAL RADIAL FRACTURE;  Surgeon: Johnny Bridge, MD;  Location: [facility];  Service: Orthopedics;  Laterality: Left;  . Colonoscopy  03/25/2007    Diverticulosis   . Esophagogastroduodenoscopy  04/10/2012    HH, and Stricture    History  Substance Use Topics  . Smoking status: Never Smoker   . Smokeless tobacco: Never Used  . Alcohol Use: Yes     Comment: socially    Family History  Problem Relation Age of Onset  . Breast cancer Sister   . Cancer Sister     breast (half sister - same mother)  . Hypertension Mother   . Diabetes Mother   . Heart disease Mother   . Lung cancer Father   . Cancer Father     lung  . Colon cancer Neg Hx     Allergies  Allergen Reactions  . Buprenex [Buprenorphine] Itching    Medication list has been reviewed and updated.  Current Outpatient Prescriptions on File Prior to Visit  Medication Sig Dispense Refill  . estradiol (ESTRACE) 1 MG tablet Take 1 mg by mouth daily.      . Clobetasol Prop Emollient Base 0.05 % emollient cream Apply 1 application topically as needed. Do not use  on face or genitals       No current facility-administered medications on file prior to visit.    Review of Systems:  As per HPI, otherwise negative.    Physical Examination: Filed Vitals:   07/22/14 1814  BP: 124/84  Pulse: 70  Temp: 98.7 F (37.1 C)  Resp: 16   Filed Vitals:   07/22/14 1814  Height: 5' 9.5" (1.765 m)  Weight: 239 lb (108.41 kg)   Body mass index is 34.8 kg/(m^2). Ideal Body Weight: Weight in (lb) to have BMI = 25: 171.4  GEN: WDWN, NAD, Non-toxic, A & O x 3 HEENT: Atraumatic, Normocephalic. Neck supple. No masses, No LAD. Ears and Nose: No external deformity. CV: RRR, No M/G/R. No JVD. No thrill. No extra heart sounds. PULM: CTA B, no  wheezes, crackles, rhonchi. No retractions. No resp. distress. No accessory muscle use. ABD: S, NT, ND, +BS. No rebound. No HSM. EXTR: No c/c/e NEURO Normal gait.  PSYCH: Normally interactive. Conversant. Not depressed or anxious appearing.  Calm demeanor.    Assessment and Plan: Sinusitis Bronchitis augmentin mucinex d Phen c cod    Signed,  Ellison Carwin, MD

## 2014-08-12 ENCOUNTER — Other Ambulatory Visit (INDEPENDENT_AMBULATORY_CARE_PROVIDER_SITE_OTHER): Payer: BC Managed Care – PPO

## 2014-08-12 ENCOUNTER — Other Ambulatory Visit: Payer: Self-pay | Admitting: Internal Medicine

## 2014-08-12 ENCOUNTER — Ambulatory Visit (INDEPENDENT_AMBULATORY_CARE_PROVIDER_SITE_OTHER): Payer: BC Managed Care – PPO | Admitting: Internal Medicine

## 2014-08-12 ENCOUNTER — Encounter: Payer: Self-pay | Admitting: Internal Medicine

## 2014-08-12 VITALS — BP 118/86 | HR 77 | Temp 98.3°F | Resp 12 | Wt 236.1 lb

## 2014-08-12 DIAGNOSIS — R197 Diarrhea, unspecified: Secondary | ICD-10-CM

## 2014-08-12 DIAGNOSIS — R1030 Lower abdominal pain, unspecified: Secondary | ICD-10-CM

## 2014-08-12 LAB — BASIC METABOLIC PANEL
BUN: 7 mg/dL (ref 6–23)
CO2: 21 mEq/L (ref 19–32)
Calcium: 8.9 mg/dL (ref 8.4–10.5)
Chloride: 106 mEq/L (ref 96–112)
Creatinine, Ser: 0.5 mg/dL (ref 0.4–1.2)
GFR: 133.48 mL/min (ref >60.00)
Glucose, Bld: 86 mg/dL (ref 70–99)
Potassium: 4 mEq/L (ref 3.5–5.1)
Sodium: 139 mEq/L (ref 135–145)

## 2014-08-12 LAB — CBC WITH DIFFERENTIAL/PLATELET
Basophils Absolute: 0.1 10*3/uL (ref 0.0–0.1)
Basophils Relative: 1 % (ref 0.0–3.0)
Eosinophils Absolute: 0.3 10*3/uL (ref 0.0–0.7)
Eosinophils Relative: 2.4 % (ref 0.0–5.0)
HCT: 40.4 % (ref 36.0–46.0)
Hemoglobin: 13.3 g/dL (ref 12.0–15.0)
Lymphocytes Relative: 17.7 % (ref 12.0–46.0)
Lymphs Abs: 1.9 10*3/uL (ref 0.7–4.0)
MCHC: 33 g/dL (ref 30.0–36.0)
MCV: 84.6 fl (ref 78.0–100.0)
Monocytes Absolute: 0.7 10*3/uL (ref 0.1–1.0)
Monocytes Relative: 6.6 % (ref 3.0–12.0)
Neutro Abs: 7.9 10*3/uL — ABNORMAL HIGH (ref 1.4–7.7)
Neutrophils Relative %: 72.3 % (ref 43.0–77.0)
Platelets: 288 10*3/uL (ref 150.0–400.0)
RBC: 4.78 Mil/uL (ref 3.87–5.11)
RDW: 12.8 % (ref 11.5–15.5)
WBC: 10.9 10*3/uL — ABNORMAL HIGH (ref 4.0–10.5)

## 2014-08-12 MED ORDER — TRAMADOL HCL 50 MG PO TABS: 50.0000 mg | ORAL_TABLET | Freq: Four times a day (QID) | ORAL | Status: DC | PRN

## 2014-08-12 MED ORDER — METRONIDAZOLE 500 MG PO TABS: 500.0000 mg | ORAL_TABLET | Freq: Three times a day (TID) | ORAL | Status: DC

## 2014-08-12 NOTE — Progress Notes (Signed)
   Subjective:    Patient ID: Wanda Moore, female    DOB: 03-16-1961, 53 y.o.   MRN: [id number]  HPI   Symptoms began 08/06/14 at work. This occurred after she had eaten lunch as frankly watery diarrhea.  This has been progressive and has continued as @ least 10-15 liquid BMs per day.  She has intermittent sharp, stabbing, cramping pain up to level 9 in the lower abdomen bilaterally. This lasts minutes  She's had associated 5 pound weight loss since the onset  She took 10 days of Augmentin from 9/20-9/30/15  Past history includes Nissen fundoplication ,diverticulosis , Barrett's esophagus & cholecystectomy. She still has her appendix.  Review of Systems  She denies fever, chills, or sweats  She is not having nausea, vomiting, dyspepsia, hematemesis, melena, rectal bleeding  She also denies radiation of the pain from  the lower quadrants  She has not had lightheadedness or passing out. Urine output has not decreased .She has no dysuria, pyuria, or hematuria.     Objective:   Physical Exam  Positive or  pertinent findings include: She's very tender diffusely over the abdomen especially in the lower orbits bilaterally.   General appearance :adequately nourished; in no distress. Eyes: No conjunctival inflammation or scleral icterus is present. Oral exam: Dental hygiene is good. Lips and gums are healthy appearing.There is no oropharyngeal erythema or exudate noted.  Heart:  Normal rate and regular rhythm. S1 and S2 normal without gallop, murmur, click, rub or other extra sounds   Lungs:Chest clear to auscultation; no wheezes, rhonchi,rales ,or rubs present.No increased work of breathing.  Abdomen: bowel sounds normal, soft  without masses, organomegaly or hernias noted.  No guarding or rebound. No flank tenderness to percussion. Vascular : all pulses equal ; no bruits present. Skin:Warm & dry.  Intact without suspicious lesions or rashes ; no jaundice or tenting Lymphatic:  No lymphadenopathy is noted about the head, neck, axilla           Assessment & Plan:  #1 severe progressive diarrhea in the context of ten-day course of Augmentin. The most likely etiology here is Clostridium difficile colitis, doubt appendicitis.  Plan see orders and recommendations

## 2014-08-12 NOTE — Patient Instructions (Signed)
Immodium AD as needed for frank diarrhea  Please take a probiotic , Florastor OR Align, every day until the bowels are normal. This will replace the normal bacteria which  are necessary for formation of normal stool and processing of food.

## 2014-08-12 NOTE — Progress Notes (Signed)
Pre visit review using our clinic review tool, if applicable. No additional management support is needed unless otherwise documented below in the visit note. 

## 2014-08-13 LAB — C. DIFFICILE GDH AND TOXIN A/B
C. difficile GDH: NOT DETECTED
C. difficile Toxin A/B: NOT DETECTED

## 2014-08-19 ENCOUNTER — Encounter: Payer: Self-pay | Admitting: Gastroenterology

## 2014-09-21 ENCOUNTER — Ambulatory Visit: Payer: BC Managed Care – PPO | Admitting: Internal Medicine

## 2014-11-06 DIAGNOSIS — Z923 Personal history of irradiation: Secondary | ICD-10-CM

## 2014-11-06 HISTORY — PX: BREAST LUMPECTOMY: SHX2

## 2014-11-06 HISTORY — DX: Personal history of irradiation: Z92.3

## 2014-11-11 ENCOUNTER — Other Ambulatory Visit: Payer: Self-pay | Admitting: Obstetrics and Gynecology

## 2014-11-12 LAB — CYTOLOGY - PAP

## 2015-03-03 ENCOUNTER — Other Ambulatory Visit: Payer: Self-pay | Admitting: Obstetrics and Gynecology

## 2015-03-03 DIAGNOSIS — R921 Mammographic calcification found on diagnostic imaging of breast: Secondary | ICD-10-CM

## 2015-03-08 ENCOUNTER — Other Ambulatory Visit: Payer: Self-pay | Admitting: Obstetrics and Gynecology

## 2015-03-08 ENCOUNTER — Ambulatory Visit
Admission: RE | Admit: 2015-03-08 | Discharge: 2015-03-08 | Disposition: A | Discharge status: Home / Self Care | Payer: BLUE CROSS/BLUE SHIELD | Source: Ambulatory Visit | Attending: Obstetrics and Gynecology | Admitting: Obstetrics and Gynecology

## 2015-03-08 DIAGNOSIS — R921 Mammographic calcification found on diagnostic imaging of breast: Secondary | ICD-10-CM

## 2015-03-10 ENCOUNTER — Other Ambulatory Visit: Payer: Self-pay | Admitting: Obstetrics and Gynecology

## 2015-03-10 DIAGNOSIS — R921 Mammographic calcification found on diagnostic imaging of breast: Secondary | ICD-10-CM

## 2015-03-11 ENCOUNTER — Ambulatory Visit
Admission: RE | Admit: 2015-03-11 | Discharge: 2015-03-11 | Disposition: A | Discharge status: Home / Self Care | Payer: BLUE CROSS/BLUE SHIELD | Source: Ambulatory Visit | Attending: Obstetrics and Gynecology | Admitting: Obstetrics and Gynecology

## 2015-03-11 DIAGNOSIS — R921 Mammographic calcification found on diagnostic imaging of breast: Secondary | ICD-10-CM

## 2015-03-15 ENCOUNTER — Telehealth: Payer: Self-pay | Admitting: *Deleted

## 2015-03-15 ENCOUNTER — Encounter: Payer: Self-pay | Admitting: *Deleted

## 2015-03-15 DIAGNOSIS — C50512 Malignant neoplasm of lower-outer quadrant of left female breast: Secondary | ICD-10-CM

## 2015-03-15 HISTORY — DX: Malignant neoplasm of lower-outer quadrant of left female breast: C50.512

## 2015-03-15 NOTE — Telephone Encounter (Signed)
Confirmed [facility] for 03/17/15 at 0800 .  Instructions and contact information given.

## 2015-03-17 ENCOUNTER — Ambulatory Visit: Payer: BLUE CROSS/BLUE SHIELD

## 2015-03-17 ENCOUNTER — Ambulatory Visit: Payer: BLUE CROSS/BLUE SHIELD | Attending: Surgery | Admitting: Physical Therapy

## 2015-03-17 ENCOUNTER — Encounter: Payer: Self-pay | Admitting: Hematology and Oncology

## 2015-03-17 ENCOUNTER — Other Ambulatory Visit: Payer: Self-pay | Admitting: Surgery

## 2015-03-17 ENCOUNTER — Ambulatory Visit (HOSPITAL_BASED_OUTPATIENT_CLINIC_OR_DEPARTMENT_OTHER): Payer: BLUE CROSS/BLUE SHIELD | Admitting: Hematology and Oncology

## 2015-03-17 ENCOUNTER — Other Ambulatory Visit (HOSPITAL_BASED_OUTPATIENT_CLINIC_OR_DEPARTMENT_OTHER): Payer: BLUE CROSS/BLUE SHIELD

## 2015-03-17 ENCOUNTER — Ambulatory Visit
Admission: RE | Admit: 2015-03-17 | Discharge: 2015-03-17 | Disposition: A | Discharge status: Home / Self Care | Payer: BLUE CROSS/BLUE SHIELD | Source: Ambulatory Visit | Attending: Radiation Oncology | Admitting: Radiation Oncology

## 2015-03-17 ENCOUNTER — Encounter: Payer: Self-pay | Admitting: Physical Therapy

## 2015-03-17 ENCOUNTER — Encounter: Payer: Self-pay | Admitting: *Deleted

## 2015-03-17 ENCOUNTER — Encounter: Payer: Self-pay | Admitting: Skilled Nursing Facility1

## 2015-03-17 VITALS — BP 134/78 | HR 76 | Temp 99.4°F | Resp 18 | Ht 69.5 in | Wt 244.4 lb

## 2015-03-17 DIAGNOSIS — Z803 Family history of malignant neoplasm of breast: Secondary | ICD-10-CM

## 2015-03-17 DIAGNOSIS — C50512 Malignant neoplasm of lower-outer quadrant of left female breast: Secondary | ICD-10-CM

## 2015-03-17 DIAGNOSIS — R293 Abnormal posture: Secondary | ICD-10-CM | POA: Diagnosis not present

## 2015-03-17 DIAGNOSIS — Z170 Estrogen receptor positive status [ER+]: Secondary | ICD-10-CM

## 2015-03-17 DIAGNOSIS — C50912 Malignant neoplasm of unspecified site of left female breast: Secondary | ICD-10-CM

## 2015-03-17 LAB — CBC WITH DIFFERENTIAL/PLATELET
BASO%: 0.8 % (ref 0.0–2.0)
Basophils Absolute: 0.1 10*3/uL (ref 0.0–0.1)
EOS%: 2.8 % (ref 0.0–7.0)
Eosinophils Absolute: 0.2 10*3/uL (ref 0.0–0.5)
HCT: 37 % (ref 34.8–46.6)
HGB: 12.8 g/dL (ref 11.6–15.9)
LYMPH%: 27.6 % (ref 14.0–49.7)
MCH: 28.7 pg (ref 25.1–34.0)
MCHC: 34.6 g/dL (ref 31.5–36.0)
MCV: 83 fL (ref 79.5–101.0)
MONO#: 0.5 10*3/uL (ref 0.1–0.9)
MONO%: 6.4 % (ref 0.0–14.0)
NEUT#: 5 10*3/uL (ref 1.5–6.5)
NEUT%: 62.4 % (ref 38.4–76.8)
Platelets: 233 10*3/uL (ref 145–400)
RBC: 4.46 10*6/uL (ref 3.70–5.45)
RDW: 13.1 % (ref 11.2–14.5)
WBC: 8 10*3/uL (ref 3.9–10.3)
lymph#: 2.2 10*3/uL (ref 0.9–3.3)

## 2015-03-17 LAB — COMPREHENSIVE METABOLIC PANEL (CC13)
ALT: 17 U/L (ref 0–55)
AST: 15 U/L (ref 5–34)
Albumin: 3.4 g/dL — ABNORMAL LOW (ref 3.5–5.0)
Alkaline Phosphatase: 65 U/L (ref 40–150)
Anion Gap: 11 mEq/L (ref 3–11)
BUN: 8.7 mg/dL (ref 7.0–26.0)
CO2: 24 mEq/L (ref 22–29)
Calcium: 8.7 mg/dL (ref 8.4–10.4)
Chloride: 106 mEq/L (ref 98–109)
Creatinine: 0.7 mg/dL (ref 0.6–1.1)
EGFR: >90 mL/min/{1.73_m2} (ref >90)
Glucose: 129 mg/dl (ref 70–140)
Potassium: 3.7 mEq/L (ref 3.5–5.1)
Sodium: 142 mEq/L (ref 136–145)
Total Bilirubin: 0.46 mg/dL (ref 0.20–1.20)
Total Protein: 6.6 g/dL (ref 6.4–8.3)

## 2015-03-17 NOTE — Progress Notes (Signed)
Checked in new pt with no financial concerns prior to seeing the dr.  Informed pt if chemo is part of her treatment we will call her ins to see if Josem Kaufmann is required and will obtain it if it is as well as contact foundations that offer copay assistance if needed.  She has my card for any billing questions or concerns.

## 2015-03-17 NOTE — Progress Notes (Signed)
Clinical Social Work [place] Psychosocial Distress Screening [place]  Patient completed distress screening protocol and scored an 8 on the Psychosocial Distress Thermometer which indicates moderate distress. Clinical Social Worker met with patient and patients family in [facility] to assess for distress and other psychosocial needs. Patient stated she felt "better" after meeting with the treatment team and getting information on her treatment plan. CSW and patient discussed common feeling and emotions when being diagnosed with cancer, and the importance of support during treatment.  CSW informed patient of the support team and support services at [facility], and patient was agreeable to an Lexa referal.CSW provided contact information and encouraged patient to call with any questions or concerns.   ONCBCN DISTRESS SCREENING 03/17/2015  Screening Type Initial Screening  Distress experienced in past week (1-10) 8  Practical problem type Work/school  Emotional problem type Nervousness/Anxiety;Adjusting to illness  Physical Problem type Sleep/insomnia  Physician notified of physical symptoms Yes  Referral to clinical psychology No  Referral to clinical social work Yes  Referral to dietition No  Referral to financial advocate No  Referral to support programs Yes  Referral to palliative care No   Johnnye Lana, MSW, LCSW, OSW-C Clinical Social Worker [place] [phone]

## 2015-03-17 NOTE — Assessment & Plan Note (Signed)
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 After reviewing the pathology in detail, we proceeded to discuss the different treatment options between surgery, radiation, chemotherapy, antiestrogen therapies.  Recommendation: 1. Genetic testing because of her family history of her sister with breast cancer 2. Breast conserving surgery with sentinel lymph node biopsy 3. Oncotype DX testing depending on the final tumor size to determine chemotherapy benefit 4. Adjuvant radiation therapy followed by adjuvant antiestrogen therapy  Oncotype DX counseling:I discussed Oncotype DX test. I explained to the patient that this is a 21 gene panel to evaluate patient tumors DNA to calculate recurrence score. This would help determine whether patient has high risk or intermediate risk or low risk breast cancer. She understands that if her tumor was found to be high risk, she would benefit from systemic chemotherapy. If low risk, no need of chemotherapy. If she was found to be intermediate risk, we would need to evaluate the score as well as other risk factors and determine if an abbreviated chemotherapy may be of benefit.  Return to clinic after surgery to discuss pathology results and come up with the adjuvant treatment plan.

## 2015-03-17 NOTE — Patient Instructions (Signed)

## 2015-03-17 NOTE — Progress Notes (Signed)
Subjective:     Patient ID: Wanda Moore, female   DOB: September 15, 1961, 54 y.o.   MRN: [id number]  HPI   Review of Systems     Objective:   Physical Exam For the patient to understand and be given the tools to implement a healthy plant based diet during their cancer diagnosis.     Assessment:     Patient was seen today and found to be in good spirits and accompanied by her seemingly supportive mom and female companion. Pts left breast afflicted. Pt had GERD but then got surgery to correct it. Pt does have barrett's esophagus and has had her gall bladder removed. Pts ht 5'10'' wt 244 pounds and 35.6 BMI. Pt stated her cousin told her to eat asparagus 3 times a day every day to fight cancer.  Pt states due to her barretts esophagus she has trouble swallowing certain items that have a tendency to clump and ball such as bread and white rice.     Plan:     Dietitian educated the patient on implementing a plant based diet by incorporating more plant proteins, fruits, and vegetables. As a part of a healthy routine physical activity was discussed. Dietitian discussed all fruits and vegetables within the realm of health. Dietitian discussed some strategies for her swallowing issues such as smaller meals, and always having water available. A folder of evidence based information with a focus on a plant based diet and general nutrition during cancer was given to the patient.  The importance of legitimate, evidence based information was discussed and examples were given. As a part of the continuum of care the cancer dietitian's contact information was given to the patient in the event they would like to have a follow up appointment.

## 2015-03-17 NOTE — Progress Notes (Signed)
Ms. Wanda Moore is a very pleasant 54 y.o. female from [place], [place] with newly diagnosed grade 1-2 invasive ductal carcinoma & DCIS of the left breast.  Biopsy results revealed the tumor's prognostic profile is ER positive, PR positive, and HER2/neu negative. Ki67 is 15%.  She presents today with her husband and sister to the [facility]) for treatment consideration and recommendations from the breast surgeon, radiation oncologist, and medical oncologist.     I briefly met with Ms. Wanda Moore and her family during her [facility] visit today. We discussed the purpose of the Survivorship Clinic, which will include monitoring for recurrence, coordinating completion of age and gender-appropriate cancer screenings, promotion of overall wellness, as well as managing potential late/long-term side effects of anti-cancer treatments.    The treatment plan for Ms. Wanda Moore will likely include surgery, radiation therapy, and anti-estrogen therapy.  She will meet with the Genetics Counselor due to her family history of breast cancer. As of today, the intent of treatment for Ms. Wanda Moore is cure, therefore she will be eligible for the Survivorship Clinic upon her completion of treatment.  Her survivorship care plan (SCP) document will be drafted and updated throughout the course of her treatment trajectory. She will receive the SCP in an office visit with myself in the Survivorship Clinic once she has completed treatment.   Ms. Wanda Moore was encouraged to ask questions and all questions were answered to her satisfaction.  She was given my business card and encouraged to contact me with any concerns regarding survivorship.  I look forward to participating in her care.   Mike Craze, NP [place] [phone]

## 2015-03-17 NOTE — Progress Notes (Signed)
Radiation Oncology         [phone]) [phone] ________________________________  Initial outpatient Consultation - Date: 03/17/2015   Name: Wanda Moore MRN: [id number]   DOB: Mar 13, 1961  REFERRING PHYSICIAN: Alphonsa Overall, MD  DIAGNOSIS AND STAGE: Stage I left breast cancer.   HISTORY OF PRESENT ILLNESS::Wanda Moore is a 54 y.o. female who was found to have left breast calcification in 2014. Follow up was recommended, but she did not come back until recently. She was found to have a 29m area of calicification in the lower, outer, quadrant of the left breast. She underwent biopsy which showed grade I-II invasive ductal carcinoma. This was both ER and PR + and HER2 -. Ki 67 was 15%. She recovered well from biopsy. The patient presents to the clinic today for recommendation of radiation in the management of her disease. She had a sister with breast cancer at the age of 459 She has a personal history of melanoma and also been taking estrogen replacement therapy since going into early menopause. The patient is present with her sister and husband.    PREVIOUS RADIATION THERAPY: No  Past medical, social and family history were reviewed in the electronic chart. Review of symptoms was reviewed in the electronic chart. Medications were reviewed in the electronic chart.  Gynecologic History  Age at first menstrual period? 12  Are you still having periods? No Approximate date of last period? N/A Obstetric History:  How many children have you carried to term? 2 Your age at first live birth? 89 Pregnant now or trying to get pregnant? No  Have you used birth control pills or hormone shots for contraception? Yes  If so, for how long (or approximate dates)? 20 years Health Maintenance:  Have you ever had a colonoscopy? Yes If yes, date? N/A  Have you ever had a bone density? No  Date of your last PAP smear? Dec, 2015 Date of your FIRST mammogram? 1999  PHYSICAL EXAM: There were no vitals filed for this  visit.. . Pleasant female in no distress. Appears atted age. Some bruising with a rash associated with the biopsy site on the left breast. No palpable abnormalities. No palpable cervical, supraclavicular or axillary adenopathy.   IMPRESSION: 54y/o woman with T1bN0 Stage I Left Breast Cancer  PLAN: I spoke to the patient today regarding her diagnosis and options for treatment. We discussed the equivalence in terms of survival and local failure between mastectomy and breast conservation. We discussed the role of radiation in decreasing local failures in patients who undergo lumpectomy. We discussed the process of simulation and the placement tattoos. We discussed 4-6 weeks of treatment as an outpatient. We discussed the possibility of asymptomatic lung damage. We discussed the low likelihood of secondary malignancies. We discussed the possible side effects including but not limited to skin redness, fatigue, permanent skin darkening, and breast swelling. We discussed the process of simulation and the placement of tattoos. I will see her back after her Oncotype score. I did clarify with her that if she needed chemotherapy this would be performed prior to radiation. She met with surgery, medical oncology as well as a member of our patient family support team, a dietitian, our survivor navigator, and our physical therapist. She will be referred to genetic counseling. I plan on seeing her back after her surgery. At this time, the patient should discontinue her estrogen replacement therapy.   I spent 60 minutes  face to face with the patient and more than 50%  of that time was spent in counseling and/or coordination of care.  This document serves as a record of services personally performed by Thea Silversmith, MD. It was created on her behalf by Darcus Austin, a trained medical scribe. The creation of this record is based on the scribe's personal observations and the provider's statements to them. This document has  been checked and approved by the attending provider.    ------------------------------------------------  Thea Silversmith, MD

## 2015-03-17 NOTE — Progress Notes (Signed)
Note created by Dr. Gudena during office visit. Copy to patient, original to scan. 

## 2015-03-17 NOTE — Therapy (Signed)
[place], [place], [address] Phone: [phone]   Fax:  [phone]  Physical Therapy Evaluation  Patient Details  Name: Wanda Moore MRN: [id number] Date of Birth: 1960-12-12 Referring Provider:  Alphonsa Overall, MD  Encounter Date: 03/17/2015      PT End of Session - 03/17/15 1145    Visit Number 1   Number of Visits 1   PT Start Time 2035   PT Stop Time 5974  Also saw pt 1020-1030   PT Time Calculation (min) 17 min   Activity Tolerance Patient tolerated treatment well   Behavior During Therapy [facility] for tasks assessed/performed      Past Medical History  Diagnosis Date  . Esophageal reflux   . Diverticulosis of colon (without mention of hemorrhage)   . Recurrent UTI   . Barrett esophagus 2008  . Hiatal hernia 2013    large  . Stricture and stenosis of esophagus 2013  . Cough 08-03-2012     nonproductive  . Melanoma     removed from betwen shoulder blades  . Distal radius fracture, left 12/05/2012  . Allergy   . Breast cancer of lower-outer quadrant of left female breast 03/15/2015  . Breast cancer     Past Surgical History  Procedure Laterality Date  . Cesarean section    . Melanoma excision      back  . Cholecystectomy    . Laparoscopic nissen fundoplication  16/01/8452    Procedure: LAPAROSCOPIC NISSEN FUNDOPLICATION;  Surgeon: Pedro Earls, MD;  [facility];  Service: General;  Laterality: N/A;  HIATAL HERNIA REPAIR  . Open reduction internal fixation (orif) distal radial fracture  12/05/2012    Procedure: OPEN REDUCTION INTERNAL FIXATION (ORIF) DISTAL RADIAL FRACTURE;  Surgeon: Johnny Bridge, MD;  Location: [place];  Service: Orthopedics;  Laterality: Left;  . Colonoscopy  03/25/2007    Diverticulosis   . Esophagogastroduodenoscopy  04/10/2012    HH, and Stricture    There were no vitals filed for this visit.  Visit Diagnosis:  Breast cancer of  lower-outer quadrant of left female breast - Plan: PT plan of care cert/re-cert  Abnormal posture - Plan: PT plan of care cert/re-cert      Subjective Assessment - 03/17/15 1145    Pertinent History Patient was diagnosed with left ER/PR positive, HER2 negative breast cancer measuring 9 mm in size.             [facility] PT Assessment - 03/17/15 0001    Assessment   Medical Diagnosis Left breast cancer   Onset Date 03/12/15   Precautions   Precautions Other (comment)  Active breast cancer   Restrictions   Weight Bearing Restrictions No   Balance Screen   Has the patient fallen in the past 6 months No   Has the patient had a decrease in activity level because of a fear of falling?  No   Is the patient reluctant to leave their home because of a fear of falling?  No   Home Environment   Living Enviornment Private residence   Living Arrangements Spouse/significant other   Available Help at Discharge Family   Prior Function   Level of Independence Independent with basic ADLs   Vocation Full time employment   Vocation Requirements computer work   Leisure She does not exercise   Cognition   Overall Cognitive Status Within Functional Limits for tasks assessed   Posture/Postural Control   Posture/Postural  Control Postural limitations   Postural Limitations Rounded Shoulders;Forward head   ROM / Strength   AROM / PROM / Strength AROM;Strength   AROM   AROM Assessment Site Shoulder   Right/Left Shoulder Right;Left   Right Shoulder Extension 64 Degrees   Right Shoulder Flexion 150 Degrees   Right Shoulder ABduction 164 Degrees   Right Shoulder Internal Rotation 74 Degrees   Right Shoulder External Rotation 80 Degrees   Left Shoulder Extension 59 Degrees   Left Shoulder Flexion 146 Degrees   Left Shoulder ABduction 157 Degrees   Left Shoulder Internal Rotation 68 Degrees   Left Shoulder External Rotation 85 Degrees   Strength   Overall Strength Within functional limits for tasks  performed           LYMPHEDEMA/ONCOLOGY QUESTIONNAIRE - 03/17/15 1133    Type   Cancer Type Left breast   Lymphedema Assessments   Lymphedema Assessments Upper extremities   Right Upper Extremity Lymphedema   10 cm Proximal to Olecranon Process 34.4 cm   Olecranon Process 31.5 cm   10 cm Proximal to Ulnar Styloid Process 27.5 cm   Just Proximal to Ulnar Styloid Process 18.8 cm   Across Hand at [employer] 20.9 cm   At [place] of 2nd Digit 6.8 cm   Left Upper Extremity Lymphedema   10 cm Proximal to Olecranon Process 35.5 cm   Olecranon Process 31 cm   10 cm Proximal to Ulnar Styloid Process 26.4 cm   Just Proximal to Ulnar Styloid Process 19 cm   Across Hand at [employer] 20.5 cm   At [place] of 2nd Digit 6.6 cm       Patient was instructed today in a home exercise program today for post op shoulder range of motion. These included active assist shoulder flexion in sitting, scapular retraction, wall walking with shoulder abduction, and hands behind head external rotation.  She was encouraged to do these twice a day, holding 3 seconds and repeating 5 times when permitted by her physician.         PT Education - 03/17/15 1144    Education provided Yes   Education Details Post op shoulder ROM HEP and lymphedema risk reduction   Person(s) Educated Patient;Spouse   Methods Explanation;Demonstration;Handout   Comprehension Verbalized understanding;Returned demonstration              Breast Clinic Goals - 03/17/15 1149    Patient will be able to verbalize understanding of pertinent lymphedema risk reduction practices relevant to her diagnosis specifically related to skin care.   Time 1   Period Days   Status Achieved   Patient will be able to return demonstrate and/or verbalize understanding of the post-op home exercise program related to regaining shoulder range of motion.   Time 1   Period Days   Status Achieved   Patient will be able to verbalize  understanding of the importance of attending the postoperative After Breast Cancer Class for further lymphedema risk reduction education and therapeutic exercise.   Time 1   Period Days   Status Achieved              Plan - 03/17/15 1146    Clinical Impression Statement Patient was diagnosed with left ER/PR positive, HER2 negative breast cancer measuring 9 mm in size. She is planning to have a left lumpectomy and sentinel node biopsy followed by radiation and anti-estrogen therapy.  She had a previous wrist surgery in 1/14 and currently  has plates and screws in her left forearm.  She will likely benefit from PT post operatively to regain shoulder ROM and strength and reduce lymphedema risk.   Pt will benefit from skilled therapeutic intervention in order to improve on the following deficits Decreased range of motion;Increased edema;Decreased knowledge of precautions;Pain;Impaired UE functional use;Decreased strength   Rehab Potential Good   Clinical Impairments Affecting Rehab Potential none   PT Frequency One time visit   PT Treatment/Interventions Patient/family education;Therapeutic exercise   Consulted and Agree with Plan of Care Patient;Family member/caregiver   Family Member Consulted Husband and friend     Patient will follow up at outpatient cancer rehab if needed following surgery.  If the patient requires physical therapy at that time, a specific plan will be dictated and sent to the referring physician for approval. The patient was educated today on appropriate basic range of motion exercises to begin post operatively and the importance of attending the After Breast Cancer class following surgery.  Patient was educated today on lymphedema risk reduction practices as it pertains to recommendations that will benefit the patient immediately following surgery.  She verbalized good understanding.  No additional physical therapy is indicated at this time.       Problem  List Patient Active Problem List   Diagnosis Date Noted  . Breast cancer of lower-outer quadrant of left female breast 03/15/2015  . Abdominal pain, epigastric 04/16/2014  . History of Nissen fundoplication 32/76/1470  . Routine health maintenance 05/04/2013  . Distal radius fracture, left 12/05/2012  . NECK PAIN 01/05/2010  . ESOPHAGEAL STRICTURE 04/20/2008  . BARRETTS ESOPHAGUS 04/20/2008  . ALLERGIC RHINITIS 12/13/2007  . GERD 12/13/2007  . DIVERTICULOSIS, COLON 12/13/2007  . MELANOMA, TRUNK, HX OF 12/13/2007  . CHOLECYSTECTOMY, HX OF 07/16/2007    Annia Friendly, PT 03/17/2015, 11:53 AM  [place], [place], [address] Phone: [phone]   Fax:  [phone]

## 2015-03-17 NOTE — Progress Notes (Signed)
[facility] NOTE  Patient Care Team: Neena Rhymes, MD as PCP - General (Internal Medicine) Neena Rhymes, MD as PCP - Internal Medicine (Internal Medicine) Marchia Bond, MD as Consulting Physician (Orthopedic Surgery) Johnathan Hausen, MD as Consulting Physician (General Surgery) Druscilla Brownie, MD as Consulting Physician (Dermatology) Harle Battiest, MD as Consulting Physician (Obstetrics and Gynecology) Barbaraann Cao, OD as Referring Physician (Optometry) Sable Feil, MD as Consulting Physician (Gastroenterology) Alphonsa Overall, MD as Consulting Physician (General Surgery) Nicholas Lose, MD as Consulting Physician (Hematology and Oncology) Thea Silversmith, MD as Consulting Physician (Radiation Oncology) Mauro Kaufmann, RN as Registered Nurse Rockwell Germany, RN as Registered Nurse  CHIEF COMPLAINTS/PURPOSE OF CONSULTATION:  Newly diagnosed breast cancer  HISTORY OF PRESENTING ILLNESS:  Wanda Moore 54 y.o. female is here because of recent diagnosis of left breast invasive ductal carcinoma. She was found to have left breast calcifications measuring 2 mm in 2014. She was supposed to follow-up mammogram June 2015 but she did not get that. In May 2016 she underwent a mammogram that showed calcifications measuring 9 mm. 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 She was present for the multidisciplinary tumor board and she is here today to discuss a treatment plan.  I reviewed her records extensively and collaborated the history with the patient.  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  She was never exposed to fertility medications or hormone replacement therapy.  She has  family history of Breast/GYN/GI cancer Sister age 71 had breast cancer she is accompanied her today and is free of cancer.  MEDICAL HISTORY:  Past Medical History  Diagnosis Date  . Esophageal reflux   . Diverticulosis of colon (without mention of hemorrhage)   . Recurrent UTI   . Barrett esophagus 2008  . Hiatal hernia 2013    large  . Stricture and stenosis of esophagus 2013  . Cough 08-03-2012     nonproductive  . Melanoma     removed from betwen shoulder blades  . Distal radius fracture, left 12/05/2012  . Allergy   . Breast cancer of lower-outer quadrant of left female breast 03/15/2015  . Breast cancer     SURGICAL HISTORY: Past Surgical History  Procedure Laterality Date  . Cesarean section    . Melanoma excision      back  . Cholecystectomy    . Laparoscopic nissen fundoplication  67/04/7208    Procedure: LAPAROSCOPIC NISSEN FUNDOPLICATION;  Surgeon: Pedro Earls, MD;  [facility];  Service: General;  Laterality: N/A;  HIATAL HERNIA REPAIR  . Open reduction internal fixation (orif) distal radial fracture  12/05/2012    Procedure: OPEN REDUCTION INTERNAL FIXATION (ORIF) DISTAL RADIAL FRACTURE;  Surgeon: Johnny Bridge, MD;  Location: [facility];  Service: Orthopedics;  Laterality: Left;  . Colonoscopy  03/25/2007    Diverticulosis   . Esophagogastroduodenoscopy  04/10/2012    HH, and Stricture    SOCIAL HISTORY: History   Social History  . Marital Status: Married    Spouse Name: N/A  . Number of Children: 2  .  Years of Education: 14   Occupational History  . adm assistance [employer]   Social History Main Topics  . Smoking status: Never Smoker   . Smokeless tobacco: Never Used  . Alcohol Use: Yes     Comment: socially  . Drug Use: No  . Sexual Activity: Not on file   Other Topics Concern  . Not on file   Social History Narrative   HSG, [place] - 2 years. Married 1990. 2 dtrs - '92, '94. Marriage in good health.   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 Breast cancer Sister   . Cancer Sister     breast (half sister - same mother)  . Hypertension Mother   . Diabetes Mother   . Heart disease Mother   . Lung cancer Father   . Cancer Father     lung  . Colon cancer Neg Hx     ALLERGIES:  is allergic to buprenex.  MEDICATIONS:  Current Outpatient Prescriptions  Medication Sig Dispense Refill  . acetaminophen (TYLENOL) 325 MG tablet Take 650 mg by mouth every 6 (six) hours as needed.    Marland Kitchen estradiol (ESTRACE) 1 MG tablet Take 1 mg by mouth daily.    . medroxyPROGESTERone (PROVERA) 2.5 MG tablet Take 2.5 mg by mouth daily.      No current facility-administered medications for this visit.    REVIEW OF SYSTEMS:   Constitutional: Denies fevers, chills or abnormal night sweats Eyes: Denies blurriness of vision, double vision or watery eyes Ears, nose, mouth, throat, and face: Denies mucositis or sore throat Respiratory: Denies cough, dyspnea or wheezes Cardiovascular: Denies palpitation, chest discomfort or lower extremity swelling Gastrointestinal:  Denies nausea, heartburn or change in bowel habits Skin: Denies abnormal skin rashes Lymphatics: Denies new lymphadenopathy or easy bruising Neurological:Denies numbness, tingling or new weaknesses Behavioral/Psych: Mood is stable, no new changes  Breast:  Denies any palpable lumps or discharge All other systems were reviewed with the patient and  are negative.  PHYSICAL EXAMINATION: ECOG PERFORMANCE STATUS: 0 - Asymptomatic  Filed Vitals:   03/17/15 0844  BP: 134/78  Pulse: 76  Temp: 99.4 F (37.4 C)  Resp: 18   Filed Weights   03/17/15 0844  Weight: 244 lb 6.4 oz (110.859 kg)    GENERAL:alert, no distress and comfortable SKIN: skin color, texture, turgor are normal, no rashes or significant lesions EYES: normal, conjunctiva are pink and non-injected, sclera clear OROPHARYNX:no exudate, no erythema and lips, buccal mucosa, and tongue normal  NECK: supple, thyroid normal size, non-tender, without nodularity LYMPH:  no palpable lymphadenopathy in the cervical, axillary or inguinal LUNGS: clear to auscultation and percussion with normal breathing effort HEART: regular rate & rhythm and no murmurs and no lower extremity edema ABDOMEN:abdomen soft, non-tender and normal bowel sounds Musculoskeletal:no cyanosis of digits and no clubbing  PSYCH: alert & oriented x 3 with fluent speech NEURO: no focal motor/sensory deficits BREAST: No palpable nodules in breast. Tenderness and bruising from recent biopsies. No palpable  axillary or supraclavicular lymphadenopathy (exam performed in the presence of a chaperone)   LABORATORY DATA:  I have reviewed the data as listed Lab Results  Component Value Date   WBC 8.0 03/17/2015   HGB 12.8 03/17/2015   HCT 37.0 03/17/2015   MCV 83.0 03/17/2015   PLT 233 03/17/2015   Lab Results  Component Value Date   NA 142 03/17/2015   K 3.7 03/17/2015   CL 106 08/12/2014   CO2 24 03/17/2015    RADIOGRAPHIC STUDIES: I have personally reviewed the radiological reports and agreed with the findings in the report.  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 After reviewing the pathology in detail, we proceeded to discuss the different treatment options between surgery, radiation, chemotherapy, antiestrogen therapies.  Recommendation: 1. Genetic testing because of her family history of her sister with breast cancer 2. Breast conserving surgery with sentinel lymph node biopsy 3. Oncotype DX testing depending on the final tumor size to determine chemotherapy benefit 4. Adjuvant radiation therapy followed by adjuvant antiestrogen therapy  Oncotype DX counseling:I discussed Oncotype DX test. I explained to the patient that this is a 21 gene panel to evaluate patient tumors DNA to calculate recurrence score. This would help determine whether patient has high risk or intermediate risk or low risk breast cancer. She understands that if her tumor was found to be high risk, she would benefit from systemic chemotherapy. If low risk, no need of chemotherapy. If she was found to be intermediate risk, we would need to evaluate the score as well as other risk factors and determine if an abbreviated chemotherapy may be of benefit.  Return to clinic after surgery to discuss pathology results and come up with the adjuvant treatment plan.  All questions were answered. The patient knows to call the clinic with any problems, questions or concerns.    Rulon Eisenmenger, MD 10:57 AM

## 2015-03-18 ENCOUNTER — Ambulatory Visit (HOSPITAL_BASED_OUTPATIENT_CLINIC_OR_DEPARTMENT_OTHER): Payer: BLUE CROSS/BLUE SHIELD | Admitting: Genetic Counselor

## 2015-03-18 ENCOUNTER — Other Ambulatory Visit: Payer: BLUE CROSS/BLUE SHIELD

## 2015-03-18 DIAGNOSIS — C50919 Malignant neoplasm of unspecified site of unspecified female breast: Secondary | ICD-10-CM | POA: Insufficient documentation

## 2015-03-18 DIAGNOSIS — Z315 Encounter for genetic counseling: Secondary | ICD-10-CM

## 2015-03-18 DIAGNOSIS — C50512 Malignant neoplasm of lower-outer quadrant of left female breast: Secondary | ICD-10-CM

## 2015-03-18 DIAGNOSIS — Z803 Family history of malignant neoplasm of breast: Secondary | ICD-10-CM | POA: Diagnosis not present

## 2015-03-18 DIAGNOSIS — C439 Malignant melanoma of skin, unspecified: Secondary | ICD-10-CM

## 2015-03-18 DIAGNOSIS — C50912 Malignant neoplasm of unspecified site of left female breast: Secondary | ICD-10-CM

## 2015-03-18 NOTE — Progress Notes (Signed)
[facility] New Patient Visit  REFERRING PROVIDER: Dr. Nicholas Lose  PRIMARY PROVIDER:  Adella Hare, MD  PRIMARY REASON FOR VISIT:  1. Breast cancer, left   2. Family history of malignant neoplasm of breast   3. Melanoma of skin     HISTORY OF PRESENT ILLNESS:   Wanda Moore, a 54 y.o. female, was seen for a [place] cancer genetics consultation at the request of Dr. No ref. provider found due to a personal and family history of cancer.  [name] presents to clinic today to discuss the possibility of a hereditary predisposition to cancer, genetic testing, and to further clarify her future cancer risks, as well as potential cancer risks for family members.   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, ALH, grade 1-2, ER/PR positive HER-2 negative Ki-67 15%    Past Medical History  Diagnosis Date   Esophageal reflux    Diverticulosis of colon (without mention of hemorrhage)    Recurrent UTI    Barrett esophagus 2008   Hiatal hernia 2013    large   Stricture and stenosis of esophagus 2013   Cough 08-03-2012     nonproductive   Melanoma     removed from betwen shoulder blades   Distal radius fracture, left 12/05/2012   Allergy    Breast cancer of lower-outer quadrant of left female breast 03/15/2015   Breast cancer     Past Surgical History  Procedure Laterality Date   Cesarean section     Melanoma excision      back   Cholecystectomy     Laparoscopic nissen fundoplication  33/01/5455    Procedure: LAPAROSCOPIC NISSEN FUNDOPLICATION;  Surgeon: Pedro Earls, MD;  [facility];  Service:  General;  Laterality: N/A;  HIATAL HERNIA REPAIR   Open reduction internal fixation (orif) distal radial fracture  12/05/2012    Procedure: OPEN REDUCTION INTERNAL FIXATION (ORIF) DISTAL RADIAL FRACTURE;  Surgeon: Johnny Bridge, MD;  Location: [facility];  Service: Orthopedics;  Laterality: Left;   Colonoscopy  03/25/2007    Diverticulosis    Esophagogastroduodenoscopy  04/10/2012    HH, and Stricture    History   Social History   Marital Status: Married    Spouse Name: N/A   Number of Children: 2   Years of Education: 58   Occupational History   adm assistance Insurance account manager   Social History Main Topics   Smoking status: Never Smoker    Smokeless tobacco: Never Used   Alcohol Use: Yes     Comment: socially   Drug Use: No   Sexual Activity: Not on file   Other Topics Concern   Not on file   Social History Narrative   HSG, [place] - 2 years. Married 1990. 2 dtrs - '92, '94. Marriage in good health.   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 A copy of the pedigree with be scanned into Epic under the Media tab. Significant family history diagnoses include the following: Family History  Problem Relation Age of Onset   Breast cancer Sister    Cancer Sister 39    breast (half sister - same mother)   Hypertension Mother    Diabetes Mother    Heart disease Mother    Lung cancer Father    Cancer Father 25    lung   Other Father     father was adopted - no paternal family history information   Colon cancer Neg Hx    Ms. Guymon's ancestry is of Caucasian descent. There is no known Jewish ancestry or consanguinity.  GENETIC COUNSELING ASSESSMENT:  [name] is a 54 y.o. female with a personal and family history of cancer suggestive of a hereditary predisposition to cancer. We, therefore, discussed and recommended the following at today's visit.    DISCUSSION:  We reviewed the characteristics, features and inheritance patterns of hereditary cancer syndromes. We also discussed genetic testing, including the appropriate family members to test, the process of testing, insurance coverage and turn-around-time for results. We discussed the implications of a negative, positive and/or variant of uncertain significant result. In order to get genetic test results in a timely manner so that Ms. Madera can use these genetic test results for surgical decisions, we recommended Ms. Dawn pursue genetic testing for the BRCAplus gene panel. The BRCAplus gene panel offered by [employer] includes sequencing and rearrangement analysis for the following 6 genes: BRCA1, BRCA2, CDH1, PALB2, PTEN, and TP53. If this test is negative, we then recommend Ms. Halberstadt pursue reflex genetic testing to the BreastNext gene panel. The BreastNext gene panel offered by [employer] includes sequencing and rearrangement analysis for the following 17 genes: ATM, BARD1, BRCA1, BRCA2, BRIP1, CDH1, CHEK2, MRE11A, MUTYH, NBN, NF1, PALB2, PTEN, RAD50, RAD51C, RAD51D, and TP53.  PLAN:  Based on our above recommendation, Ms. Vasconez wished to pursue genetic testing and the blood sample was drawn and will be sent to [facility] for analysis. Results for the BRCAplus gene panel should be available within approximately 2 weeks time, at which point they will be disclosed by telephone to Ms. Vidana, as will any additional recommendations warranted by these results. Lastly, we encouraged Ms. Apps to remain in contact with cancer genetics annually so that we can continuously update the family history and inform her of any changes in cancer genetics and testing that may be of benefit for this family.   Ms.  Oliger questions were answered to her satisfaction today. Our contact information was provided should additional questions or concerns arise. Thank you for the referral and  allowing [place] to share in the care of your patient.   Catherine A. Fine, MS, CGC Certified Psychologist, [email or web address] phone: [phone]  The patient was seen for a total of 40 minutes in face-to-face genetic counseling.  This patient was discussed with Dr. [name] who agrees with the above.    ______________________________________________________________________ For Office Staff:  Number of people involved in session including genetic counselor: 2 Was an intern or student involved with case: not applicable

## 2015-03-19 ENCOUNTER — Other Ambulatory Visit: Payer: Self-pay | Admitting: Surgery

## 2015-03-19 DIAGNOSIS — C50912 Malignant neoplasm of unspecified site of left female breast: Secondary | ICD-10-CM

## 2015-03-22 ENCOUNTER — Encounter (HOSPITAL_BASED_OUTPATIENT_CLINIC_OR_DEPARTMENT_OTHER): Payer: Self-pay | Admitting: *Deleted

## 2015-03-22 ENCOUNTER — Telehealth: Payer: Self-pay | Admitting: *Deleted

## 2015-03-22 NOTE — Telephone Encounter (Signed)
Spoke to pt concerning [place] from 03/17/15. Denies questions or concerns regarding dx or treatment care plan. Confirmed surgery date for 03/25/15. Scheduled and confirmed f/u appt with Dr. [name] on 04/01/15 at 1115. Encourage pt to call with needs. Received verbal understanding. Contact information given.

## 2015-03-24 NOTE — H&P (Signed)
Wanda Moore 03/17/2015 10:14 AM Location: [facility] Patient #: [id number] DOB: 10-01-1961 Married / Language: Cleophus Molt / Race: White Female  History of Present Illness: The patient is a 54 year old female who presents with breast cancer.  Her PCP was Dr. Linda Hedges. I don't think that she has found a new PCP. She is at the [facility] clinic. She is seeing Drs. Christel Mormon. She is here with her husband, Louie Casa, and sister, Barbarann Ehlers.  She felt nothing in her breast and she has had no trouble with her breast in the past. She was having rountine mammography at [facility] on 03/08/2015 which showed Mild increased in the small group of faint pleomorphic calcifications located within the lower outer quadrant left breast. She had a left breast biopsy 03/11/2015 ([id number]) which showed IDC, DCIS, lobular neoplasia. ER/PR - pos, Her2Neu - neg, Ki67-15%  She has been on estradiol and progesterone. She will stop these. She has a 1/2 sister Tye [place]) who had breast cancer at age 32. Her sister was treated with mastectomy (left?) and axillary node dissection. Her last period was around 3 years ago.  I discussed the options for breast cancer treatment with the patient. I discussed the surgical options of lumpectomy vs. mastectomy. If mastectomy, there is the possibility of reconstruction. I discussed the options of lymph node biops. The treatment plan depends on the pathologic staging of the tumor and the patient's personal wishes. The risks of surgery include, but are not limited to, bleeding, infection, the need for further surgery, and nerve injury. She is a candidate for left breast lumpectomy (seed loc) and left axillary sentinel lymph node biopsy. Possible oncotype and rad tx. She will also get genetics. She did not want to wait on the genetics and was comfortable going ahead with surgery.  Past Medical History: 1. Nissen fundoplication -  82/02/2352 - Hassell Done  She has done well from this. She is off GERD meds  She has a hx of Barrett's. She did see Dr. Philip Aspen for GI. 2. History of melanoma of back - 2003 - F. Lupton 3. History of lap chole - 1992  Social History: Married She is here with her husband, Louie Casa, and sister, Barbarann Ehlers. She has 2 daughter: Lovena Le, 85 (graduates from [employer] this weekend - Theater) and [place], 55 yo. 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 Cesarean Section - 1 Gallbladder Surgery - Laparoscopic Nissen Fundoplication Oral Surgery  Diagnostic Studies History Anderson Malta [place], [place]; 03/17/2015 10:14 AM) Colonoscopy 5-10 years ago Mammogram within last year Pap Smear 1-5 years ago  Social History Anderson Malta Manuel Garcia, RMA; 03/17/2015 10:14 AM) Alcohol use Occasional alcohol use. Caffeine use Tea. No drug use Tobacco use Never smoker.  Family History Anderson Malta [place], [place]; 03/17/2015 10:14 AM) Arthritis Family Members In General, Mother. Breast Cancer Sister. Diabetes Mellitus Family Members In General, Mother. Hypertension Mother. Melanoma Family Members In General. Respiratory Condition Father.  Pregnancy / Birth History Anderson Malta [place], [place]; 03/17/2015 10:14 AM) Age at menarche 22 years. Age of menopause 23-55 Gravida 2 Irregular periods Para 2  Review of Systems Anderson Malta Witty RMA; 03/17/2015 10:14 AM) HEENT Present- Wears glasses/contact lenses. Not Present- Earache, Hearing Loss, Hoarseness, Nose Bleed, Oral Ulcers, Ringing in the Ears, Seasonal Allergies, Sinus Pain, Sore Throat,  Visual Disturbances and Yellow Eyes. Respiratory Not Present- Bloody sputum, Chronic Cough,  Difficulty Breathing, Snoring and Wheezing. Breast Not Present- Breast Mass, Breast Pain, Nipple Discharge and Skin Changes. Cardiovascular Not Present- Chest Pain, Difficulty Breathing Lying Down, Leg Cramps, Palpitations, Rapid Heart Rate, Shortness of Breath and Swelling of Extremities. Gastrointestinal Not Present- Abdominal Pain, Bloating, Bloody Stool, Change in Bowel Habits, Chronic diarrhea, Constipation, Difficulty Swallowing, Excessive gas, Gets full quickly at meals, Hemorrhoids, Indigestion, Nausea, Rectal Pain and Vomiting. Female Genitourinary Not Present- Frequency, Nocturia, Painful Urination, Pelvic Pain and Urgency. Musculoskeletal Not Present- Back Pain, Joint Pain, Joint Stiffness, Muscle Pain, Muscle Weakness and Swelling of Extremities. Neurological Not Present- Decreased Memory, Fainting, Headaches, Numbness, Seizures, Tingling, Tremor, Trouble walking and Weakness. Psychiatric Not Present- Anxiety, Bipolar, Change in Sleep Pattern, Depression, Fearful and Frequent crying. Endocrine Present- Excessive Hunger. Not Present- Cold Intolerance, Hair Changes, Heat Intolerance, Hot flashes and New Diabetes. Hematology Not Present- Easy Bruising, Excessive bleeding, Gland problems, HIV and Persistent Infections.  Physical Exam: General: WN WF alert and generally healthy appearing. HEENT: Normal. Pupils equal.  Neck: Supple. No mass. No thyroid mass.  Lymph Nodes: No supraclavicular, cervical or axillary nodes.  Lungs: Clear to auscultation and symmetric breath sounds. Heart: RRR. No murmur or rub.  Breasts: Right - no mass Left - Bruise at 4 o'clock. I can feel a mass effect - probable hematoma  Abdomen: Soft. No mass. No tenderness. No hernia. Normal bowel sounds. Scars from prior laparoscopic incisions. Extremities: Good strength and ROM in upper and lower extremities.  Neurologic: Grossly intact to motor and sensory function. Psychiatric: Has normal mood  and affect. Behavior is normal.  Assessment & Plan: 1.  BREAST CANCER, STAGE 1, LEFT (174.9  C50.912)  Story: Left breast biopsy 03/11/2015 ([id number]) which showed IDC, DCIS, lobular neoplasia. ER/PR - pos, Her2Neu - neg, Ki67-15%   Oncology - [facility] Impression: Plan:  Left breast lumpectomy (seed) and left axillary node dissection   Genetic consult (she wants to go ahead with surgery and not wait on genetics)   Possible oncotype   Radiation tx Current Plans:  Schedule for Surgery  2.  HISTORY OF MELANOMA (R74.08  X44.818) Story: Excision of back melanoma by Dr. Allyson Sabal in 2003. 3.  History of Nissen fundoplication  Alphonsa Overall, MD, [facility] Pager: [phone] Office phone:  [phone]

## 2015-03-25 ENCOUNTER — Ambulatory Visit (HOSPITAL_BASED_OUTPATIENT_CLINIC_OR_DEPARTMENT_OTHER)
Admission: RE | Admit: 2015-03-25 | Discharge: 2015-03-25 | Disposition: A | Discharge status: Home / Self Care | Payer: BLUE CROSS/BLUE SHIELD | Source: Ambulatory Visit | Attending: Surgery | Admitting: Surgery

## 2015-03-25 ENCOUNTER — Ambulatory Visit (HOSPITAL_COMMUNITY)
Admission: RE | Admit: 2015-03-25 | Discharge: 2015-03-25 | Disposition: A | Discharge status: Home / Self Care | Payer: BLUE CROSS/BLUE SHIELD | Source: Ambulatory Visit | Attending: Surgery | Admitting: Surgery

## 2015-03-25 ENCOUNTER — Ambulatory Visit
Admission: RE | Admit: 2015-03-25 | Discharge: 2015-03-25 | Disposition: A | Discharge status: Home / Self Care | Payer: BLUE CROSS/BLUE SHIELD | Source: Ambulatory Visit | Attending: Surgery | Admitting: Surgery

## 2015-03-25 ENCOUNTER — Ambulatory Visit (HOSPITAL_BASED_OUTPATIENT_CLINIC_OR_DEPARTMENT_OTHER): Payer: BLUE CROSS/BLUE SHIELD | Admitting: Anesthesiology

## 2015-03-25 ENCOUNTER — Encounter (HOSPITAL_BASED_OUTPATIENT_CLINIC_OR_DEPARTMENT_OTHER): Payer: Self-pay

## 2015-03-25 ENCOUNTER — Encounter (HOSPITAL_BASED_OUTPATIENT_CLINIC_OR_DEPARTMENT_OTHER)
Admission: RE | Disposition: A | Discharge status: Home / Self Care | Payer: Self-pay | Source: Ambulatory Visit | Attending: Surgery

## 2015-03-25 DIAGNOSIS — C50912 Malignant neoplasm of unspecified site of left female breast: Secondary | ICD-10-CM

## 2015-03-25 DIAGNOSIS — K219 Gastro-esophageal reflux disease without esophagitis: Secondary | ICD-10-CM | POA: Diagnosis not present

## 2015-03-25 DIAGNOSIS — K5790 Diverticulosis of intestine, part unspecified, without perforation or abscess without bleeding: Secondary | ICD-10-CM | POA: Insufficient documentation

## 2015-03-25 DIAGNOSIS — Z85820 Personal history of malignant melanoma of skin: Secondary | ICD-10-CM | POA: Diagnosis not present

## 2015-03-25 HISTORY — PX: BREAST LUMPECTOMY WITH RADIOACTIVE SEED AND SENTINEL LYMPH NODE BIOPSY: SHX6550

## 2015-03-25 HISTORY — DX: Anxiety disorder, unspecified: F41.9

## 2015-03-25 LAB — POCT HEMOGLOBIN-HEMACUE: Hemoglobin: 14.9 g/dL (ref 12.0–15.0)

## 2015-03-25 SURGERY — BREAST LUMPECTOMY WITH RADIOACTIVE SEED AND SENTINEL LYMPH NODE BIOPSY
Anesthesia: Regional | Site: Breast | Laterality: Left

## 2015-03-25 MED ORDER — DEXAMETHASONE SODIUM PHOSPHATE 4 MG/ML IJ SOLN
INTRAMUSCULAR | Status: DC | PRN
  Administered 2015-03-25: 10 mg via INTRAVENOUS

## 2015-03-25 MED ORDER — CHLORHEXIDINE GLUCONATE 4 % EX LIQD: 1.0000 "application " | Freq: Once | CUTANEOUS | Status: DC

## 2015-03-25 MED ORDER — BUPIVACAINE-EPINEPHRINE (PF) 0.5% -1:200000 IJ SOLN
INTRAMUSCULAR | Status: DC | PRN
  Administered 2015-03-25: 30 mL

## 2015-03-25 MED ORDER — MIDAZOLAM HCL 2 MG/2ML IJ SOLN
INTRAMUSCULAR | Status: AC
  Filled 2015-03-25: qty 2

## 2015-03-25 MED ORDER — HYDROMORPHONE HCL 1 MG/ML IJ SOLN: 0.2500 mg | INTRAMUSCULAR | Status: DC | PRN

## 2015-03-25 MED ORDER — PROPOFOL 10 MG/ML IV BOLUS
INTRAVENOUS | Status: DC | PRN
  Administered 2015-03-25: 200 mg via INTRAVENOUS

## 2015-03-25 MED ORDER — FENTANYL CITRATE (PF) 100 MCG/2ML IJ SOLN
INTRAMUSCULAR | Status: DC | PRN
  Administered 2015-03-25: 50 ug via INTRAVENOUS
  Administered 2015-03-25 (×2): 25 ug via INTRAVENOUS

## 2015-03-25 MED ORDER — MIDAZOLAM HCL 5 MG/5ML IJ SOLN
INTRAMUSCULAR | Status: DC | PRN
  Administered 2015-03-25: 2 mg via INTRAVENOUS

## 2015-03-25 MED ORDER — FENTANYL CITRATE (PF) 100 MCG/2ML IJ SOLN
50.0000 ug | INTRAMUSCULAR | Status: DC | PRN
  Administered 2015-03-25: 100 ug via INTRAVENOUS

## 2015-03-25 MED ORDER — ONDANSETRON HCL 4 MG/2ML IJ SOLN
INTRAMUSCULAR | Status: DC | PRN
  Administered 2015-03-25: 4 mg via INTRAVENOUS

## 2015-03-25 MED ORDER — LIDOCAINE HCL (CARDIAC) 20 MG/ML IV SOLN
INTRAVENOUS | Status: DC | PRN
  Administered 2015-03-25: 60 mg via INTRAVENOUS

## 2015-03-25 MED ORDER — BUPIVACAINE-EPINEPHRINE (PF) 0.5% -1:200000 IJ SOLN
INTRAMUSCULAR | Status: DC | PRN
  Administered 2015-03-25: 20 mL via PERINEURAL

## 2015-03-25 MED ORDER — FENTANYL CITRATE (PF) 100 MCG/2ML IJ SOLN
INTRAMUSCULAR | Status: AC
  Filled 2015-03-25: qty 6

## 2015-03-25 MED ORDER — FENTANYL CITRATE (PF) 100 MCG/2ML IJ SOLN
INTRAMUSCULAR | Status: AC
  Filled 2015-03-25: qty 2

## 2015-03-25 MED ORDER — MIDAZOLAM HCL 2 MG/2ML IJ SOLN
1.0000 mg | INTRAMUSCULAR | Status: DC | PRN
  Administered 2015-03-25: 2 mg via INTRAVENOUS

## 2015-03-25 MED ORDER — CEFAZOLIN SODIUM-DEXTROSE 2-3 GM-% IV SOLR
2.0000 g | INTRAVENOUS | Status: AC
  Administered 2015-03-25: 2 g via INTRAVENOUS

## 2015-03-25 MED ORDER — GLYCOPYRROLATE 0.2 MG/ML IJ SOLN: 0.2000 mg | Freq: Once | INTRAMUSCULAR | Status: DC | PRN

## 2015-03-25 MED ORDER — TECHNETIUM TC 99M SULFUR COLLOID FILTERED
1.0000 | Freq: Once | INTRAVENOUS | Status: AC | PRN
  Administered 2015-03-25: 1 via INTRADERMAL

## 2015-03-25 MED ORDER — HYDROCODONE-ACETAMINOPHEN 5-325 MG PO TABS: 1.0000 | ORAL_TABLET | Freq: Four times a day (QID) | ORAL | Status: DC | PRN

## 2015-03-25 MED ORDER — LACTATED RINGERS IV SOLN
INTRAVENOUS | Status: DC
  Administered 2015-03-25: 12:00:00 via INTRAVENOUS

## 2015-03-25 SURGICAL SUPPLY — 51 items
APL SKNCLS STERI-STRIP NONHPOA (GAUZE/BANDAGES/DRESSINGS)
BENZOIN TINCTURE PRP APPL 2/3 (GAUZE/BANDAGES/DRESSINGS) IMPLANT
BINDER BREAST LRG (GAUZE/BANDAGES/DRESSINGS) IMPLANT
BINDER BREAST MEDIUM (GAUZE/BANDAGES/DRESSINGS) IMPLANT
BINDER BREAST XLRG (GAUZE/BANDAGES/DRESSINGS) IMPLANT
BINDER BREAST XXLRG (GAUZE/BANDAGES/DRESSINGS) ×3 IMPLANT
BLADE SURG 15 STRL LF DISP TIS (BLADE) ×1 IMPLANT
BLADE SURG 15 STRL SS (BLADE) ×3
CANISTER SUC SOCK COL 7IN (MISCELLANEOUS) IMPLANT
CANISTER SUCT 1200ML W/VALVE (MISCELLANEOUS) ×3 IMPLANT
CHLORAPREP W/TINT 26ML (MISCELLANEOUS) ×3 IMPLANT
CLIP TI WIDE RED SMALL 6 (CLIP) ×3 IMPLANT
COVER BACK TABLE 60X90IN (DRAPES) ×3 IMPLANT
COVER MAYO STAND STRL (DRAPES) ×3 IMPLANT
COVER PROBE W GEL 5X96 (DRAPES) ×3 IMPLANT
DECANTER SPIKE VIAL GLASS SM (MISCELLANEOUS) IMPLANT
DEVICE DUBIN W/COMP PLATE 8390 (MISCELLANEOUS) ×3 IMPLANT
DRAPE LAPAROSCOPIC ABDOMINAL (DRAPES) ×3 IMPLANT
DRAPE UTILITY XL STRL (DRAPES) ×3 IMPLANT
DRSG PAD ABDOMINAL 8X10 ST (GAUZE/BANDAGES/DRESSINGS) IMPLANT
ELECT COATED BLADE 2.86 ST (ELECTRODE) ×3 IMPLANT
ELECT REM PT RETURN 9FT ADLT (ELECTROSURGICAL) ×3
ELECTRODE REM PT RTRN 9FT ADLT (ELECTROSURGICAL) ×1 IMPLANT
GAUZE SPONGE 4X4 12PLY STRL (GAUZE/BANDAGES/DRESSINGS) ×3 IMPLANT
GLOVE BIOGEL PI IND STRL 7.0 (GLOVE) ×1 IMPLANT
GLOVE BIOGEL PI IND STRL 7.5 (GLOVE) ×1 IMPLANT
GLOVE BIOGEL PI INDICATOR 7.0 (GLOVE) ×2
GLOVE BIOGEL PI INDICATOR 7.5 (GLOVE) ×2
GLOVE SURG SIGNA 7.5 PF LTX (GLOVE) ×6 IMPLANT
GLOVE SURG SS PI 7.5 STRL IVOR (GLOVE) ×3 IMPLANT
GOWN STRL REUS W/ TWL LRG LVL3 (GOWN DISPOSABLE) ×2 IMPLANT
GOWN STRL REUS W/ TWL XL LVL3 (GOWN DISPOSABLE) ×1 IMPLANT
GOWN STRL REUS W/TWL LRG LVL3 (GOWN DISPOSABLE) ×6
GOWN STRL REUS W/TWL XL LVL3 (GOWN DISPOSABLE) ×3
KIT MARKER MARGIN INK (KITS) ×3 IMPLANT
LIQUID BAND (GAUZE/BANDAGES/DRESSINGS) ×3 IMPLANT
NEEDLE HYPO 25X1 1.5 SAFETY (NEEDLE) ×3 IMPLANT
NS IRRIG 1000ML POUR BTL (IV SOLUTION) ×3 IMPLANT
PACK BASIN DAY SURGERY FS (CUSTOM PROCEDURE TRAY) ×3 IMPLANT
PENCIL BUTTON HOLSTER BLD 10FT (ELECTRODE) ×3 IMPLANT
SHEET MEDIUM DRAPE 40X70 STRL (DRAPES) ×3 IMPLANT
SLEEVE SCD COMPRESS KNEE MED (MISCELLANEOUS) ×3 IMPLANT
SPONGE LAP 18X18 X RAY DECT (DISPOSABLE) ×3 IMPLANT
SUT MON AB 5-0 PS2 18 (SUTURE) ×6 IMPLANT
SUT VICRYL 3-0 CR8 SH (SUTURE) ×6 IMPLANT
SYR CONTROL 10ML LL (SYRINGE) ×3 IMPLANT
TOWEL OR 17X24 6PK STRL BLUE (TOWEL DISPOSABLE) ×3 IMPLANT
TOWEL OR NON WOVEN STRL DISP B (DISPOSABLE) ×3 IMPLANT
TUBE CONNECTING 20'X1/4 (TUBING) ×1
TUBE CONNECTING 20X1/4 (TUBING) ×2 IMPLANT
YANKAUER SUCT BULB TIP NO VENT (SUCTIONS) ×3 IMPLANT

## 2015-03-25 NOTE — Transfer of Care (Signed)
Immediate Anesthesia Transfer of Care Note  Patient: Wanda Moore  Procedure(s) Performed: Procedure(s): LEFT BREAST LUMPECTOMY WITH RADIOACTIVE SEED AND LEFT AXILLARY SENTINEL LYMPH NODE BIOPSY (Left)  Patient Location: PACU  Anesthesia Type:GA combined with regional for post-op pain  Level of Consciousness: awake, sedated and patient cooperative  Airway & Oxygen Therapy: Patient Spontanous Breathing and Patient connected to face mask oxygen  Post-op Assessment: Report given to RN and Post -op Vital signs reviewed and stable  Post vital signs: Reviewed and stable  Last Vitals:  Filed Vitals:   03/25/15 1215  BP:   Pulse: 89  Temp:   Resp: 13    Complications: No apparent anesthesia complications

## 2015-03-25 NOTE — Op Note (Signed)
03/25/2015  1:49 PM  PATIENT:  Wanda Moore DOB: [birth year] MRN: [id number]  PREOP DIAGNOSIS:  left breast cancer, Inframammary rash  POSTOP DIAGNOSIS:   Left breast cancer, 4 o'clock position (T1, N0),  Inframammary rash  PROCEDURE:   Procedure(s): LEFT BREAST LUMPECTOMY WITH RADIOACTIVE SEED (and posterior-medial margin) AND LEFT AXILLARY SENTINEL LYMPH NODE BIOPSY  SURGEON:   Alphonsa Overall, M.D.  ANESTHESIA:   general  Anesthesiologist: Roderic Palau, MD CRNA: Lyndee Leo, CRNA; Suan Halter, CRNA  General  EBL:  minimal  ml  DRAINS: none   LOCAL MEDICATIONS USED:   20 cc 1/4% marcaine,  Left pectoral block by anesthesia.  SPECIMEN:   Left breast lumpectomy, left medial-posterior margin (suture marks medial anterior margin), left axillary sentinel lymph node (counts 770/background 10)  COUNTS CORRECT:  YES  INDICATIONS FOR PROCEDURE:  Wanda Moore is a 54 y.o. (DOB: 1961-08-03) white  female whose primary care physician is Adella Hare, MD and comes for left  breast lumpectomy and left axillary sentinel lymph node biopsy.   She was seen at the [facility] with Drs. Christel Mormon.  She also has a rash in her inframammary fold.   The options for breast cancer treatment have been discussed with the patient. She elected to proceed with lumpectomy and axillary sentinel lymph node.     The indications and potential complications of surgery were explained to the patient. Potential complications include, but are not limited to, bleeding, infection, the need for further surgery, and nerve injury.     She had a I131 seed placed on 03/25/2015 in her left breast at [facility].  I confirmed the presence of the I131 seed in the pre op area using the Neoprobe.  The seed is in the 4 o'clock position of the left breast.   In the holding area, her left areola was injected with 1 millicurie of Technitium Sulfur Colloid.  OPERATIVE NOTE:   The patient was taken to room #  8 at [facility] where she underwent a general anesthesia  supervised by Anesthesiologist: Roderic Palau, MD CRNA: Lyndee Leo, CRNA; Suan Halter, CRNA. Her left breast and axilla were prepped with  ChloraPrep and sterilely draped.    A time-out and the surgical check list was reviewed.    I found a hot area in the left axilla for Technitium with the Neoprobe, so I did not inject Methylene Blue.   I turned attention to the cancer which was about at the 4 o'clock position of the left breast, at the edge of the areola and deep (next to the chest wall) on the mammogram..   I used the Neoprobe to identify the I131 seed.  I tried to excise an area around the tumor of at least 1 cm.    I excised this block of breast tissue approximately 4 cm by 5 cm  in diameter.   I painted the lumpectomy specimen with the 6 color paint kit and did a specimen mammogram which confirmed the mass, clip, and the seed were all in the right position in the specimen.  The specimen was sent to pathology who called back to confirm that they have the seed and the specimen.  The seed and marker were close to the posterior margin.  So I took additional breast tissue posteriorly and medially.  I put a suture to the anterior-medial aspect of the biopsy and painted the anterior and posterior sides with the paint kit.  I then started the left axillary sentinel lymph node biopsy. I made an incision in the left axilla.  I found a hot area at the junction of the breast and the pectoralis major muscle. I cut down and  identified a hot node that had counts of 770 and the background has 10 counts. I checked her internal mammary nodes and supraclavicular nodes with the neoprobe and found no other hot area. The axillary node was then sent to pathology.    I then irrigated the wound with saline. I infiltrated approximately 20 mL of 1/4%  local between the incisions.  I placed 6 clips to mark biopsy cavity, at 12, 3, 6, and 9 o'clock.  Two clips were placed on the pectoralis major.   I then closed all the wounds in layers using 3-0 Vicryl sutures for the deep layer. At the skin, I closed the incisions with a 5-0 Monocryl suture. The incisions were then painted with LiquiBand.  She had gauze place over the wounds and placed in a breast binder.   The patient tolerated the procedure well, was transported to the recovery room in good condition. Sponge and needle count were correct at the end of the case.   Final pathology is pending.   Alphonsa Overall, MD, [facility] Pager: [phone] Office phone:  [phone]

## 2015-03-25 NOTE — Anesthesia Preprocedure Evaluation (Signed)
Anesthesia Evaluation  Patient identified by MRN, date of birth, ID band Patient awake    Reviewed: Allergy & Precautions, H&P , NPO status , Patient's Chart, lab work & pertinent test results  Airway Mallampati: II  TM Distance: >3 FB Neck ROM: Full    Dental no notable dental hx. (+) Teeth Intact, Dental Advisory Given   Pulmonary neg pulmonary ROS,  breath sounds clear to auscultation  Pulmonary exam normal       Cardiovascular negative cardio ROS  Rhythm:Regular Rate:Normal     Neuro/Psych negative neurological ROS  negative psych ROS   GI/Hepatic negative GI ROS, Neg liver ROS, GERD-  Controlled,  Endo/Other  negative endocrine ROS  Renal/GU negative Renal ROS  negative genitourinary   Musculoskeletal   Abdominal   Peds  Hematology negative hematology ROS (+)   Anesthesia Other Findings   Reproductive/Obstetrics negative OB ROS                             Anesthesia Physical Anesthesia Plan  ASA: II  Anesthesia Plan: General and Regional   Post-op Pain Management:    Induction: Intravenous  Airway Management Planned: LMA  Additional Equipment:   Intra-op Plan:   Post-operative Plan: Extubation in OR  Informed Consent: I have reviewed the patients History and Physical, chart, labs and discussed the procedure including the risks, benefits and alternatives for the proposed anesthesia with the patient or authorized representative who has indicated his/her understanding and acceptance.   Dental advisory given  Plan Discussed with: CRNA  Anesthesia Plan Comments:         Anesthesia Quick Evaluation

## 2015-03-25 NOTE — Anesthesia Procedure Notes (Addendum)
Anesthesia Regional Block:  Pectoralis block  Pre-Anesthetic Checklist: ,, timeout performed, Correct Patient, Correct Site, Correct Laterality, Correct Procedure, Correct Position, site marked, Risks and benefits discussed, pre-op evaluation, post-op pain management  Laterality: Left  Prep: Maximum Sterile Barrier Precautions used and chloraprep       Needles:  Injection technique: Single-shot  Needle Type: Echogenic Stimulator Needle     Needle Length: 9cm 9 cm Needle Gauge: 21 and 21 G    Additional Needles:  Procedures: ultrasound guided (picture in chart) Pectoralis block Narrative:  Start time: 03/25/2015 11:51 AM End time: 03/25/2015 12:00 PM Injection made incrementally with aspirations every 5 mL. Anesthesiologist: Roderic Palau  Additional Notes: 2% Lidocaine skin wheel.   Procedure Name: LMA Insertion Date/Time: 03/25/2015 12:23 PM Performed by: Denna Haggard D Pre-anesthesia Checklist: Patient identified, Emergency Drugs available, Suction available and Patient being monitored Patient Re-evaluated:Patient Re-evaluated prior to inductionOxygen Delivery Method: Circle System Utilized Preoxygenation: Pre-oxygenation with 100% oxygen Intubation Type: IV induction Ventilation: Mask ventilation without difficulty LMA: LMA inserted LMA Size: 4.0 Number of attempts: 1 Airway Equipment and Method: Bite block Placement Confirmation: positive ETCO2 Tube secured with: Tape Dental Injury: Teeth and Oropharynx as per pre-operative assessment

## 2015-03-25 NOTE — Discharge Instructions (Signed)
[facility] - DISCHARGE INSTRUCTIONS TO PATIENT  Return to work on:  03/31/2015  Activity:  Driving - May drive in one to 3 days, if off pain meds and doing well   Lifting - Take it easy for 7 days, then no limit  Wound Care:   Leave bandage for 2 days, then remove and shower.  Diet:  As tolerated  Follow up appointment:  Call Dr. Pollie Friar office [facility]) at [phone] for an appointment in 1 to 2 weeks.  Medications and dosages:  Resume your home medications.  You have a prescription for:  Vicodin  Call Dr. [name] or his office  [phone]) if you have:  Temperature greater than 100.4,  Persistent nausea and vomiting,  Severe uncontrolled pain,  Redness, tenderness, or signs of infection (pain, swelling, redness, odor or green/yellow discharge around the site),  Difficulty breathing, headache or visual disturbances,  Any other questions or concerns you may have after discharge.  In an emergency, call 911 or go to an Emergency Department at a nearby hospital.    Post Anesthesia Home Care Instructions  Activity: Get plenty of rest for the remainder of the day. A responsible adult should stay with you for 24 hours following the procedure.  For the next 24 hours, DO NOT: -Drive a car -Paediatric nurse -Drink alcoholic beverages -Take any medication unless instructed by your physician -Make any legal decisions or sign important papers.  Meals: Start with liquid foods such as gelatin or soup. Progress to regular foods as tolerated. Avoid greasy, spicy, heavy foods. If nausea and/or vomiting occur, drink only clear liquids until the nausea and/or vomiting subsides. Call your physician if vomiting continues.  Special Instructions/Symptoms: Your throat may feel dry or sore from the anesthesia or the breathing tube placed in your throat during surgery. If this causes discomfort, gargle with warm salt water. The discomfort should disappear within 24  hours.  If you had a scopolamine patch placed behind your ear for the management of post- operative nausea and/or vomiting:  1. The medication in the patch is effective for 72 hours, after which it should be removed.  Wrap patch in a tissue and discard in the trash. Wash hands thoroughly with soap and water. 2. You may remove the patch earlier than 72 hours if you experience unpleasant side effects which may include dry mouth, dizziness or visual disturbances. 3. Avoid touching the patch. Wash your hands with soap and water after contact with the patch.    Regional Anesthesia Blocks  1. Numbness or the inability to move the "blocked" extremity may last from 3-48 hours after placement. The length of time depends on the medication injected and your individual response to the medication. If the numbness is not going away after 48 hours, call your surgeon.  2. The extremity that is blocked will need to be protected until the numbness is gone and the  Strength has returned. Because you cannot feel it, you will need to take extra care to avoid injury. Because it may be weak, you may have difficulty moving it or using it. You may not know what position it is in without looking at it while the block is in effect.  3. For blocks in the legs and feet, returning to weight bearing and walking needs to be done carefully. You will need to wait until the numbness is entirely gone and the strength has returned. You should be able to move your leg and foot normally before  you try and bear weight or walk. You will need someone to be with you when you first try to ensure you do not fall and possibly risk injury.  4. Bruising and tenderness at the needle site are common side effects and will resolve in a few days.  5. Persistent numbness or new problems with movement should be communicated to the surgeon or the [place] [phone] [place] [phone]).

## 2015-03-25 NOTE — Progress Notes (Signed)
Assisted Dr. Oren Bracket with left, ultrasound guided, pectoralis block. Side rails up, monitors on throughout procedure. See vital signs in flow sheet. Tolerated Procedure well.

## 2015-03-25 NOTE — Anesthesia Postprocedure Evaluation (Signed)
  Anesthesia Post-op Note  Patient: Wanda Moore  Procedure(s) Performed: Procedure(s): LEFT BREAST LUMPECTOMY WITH RADIOACTIVE SEED AND LEFT AXILLARY SENTINEL LYMPH NODE BIOPSY (Left)  Patient Location: PACU  Anesthesia Type:General and block  Level of Consciousness: awake and alert   Airway and Oxygen Therapy: Patient Spontanous Breathing  Post-op Pain: none  Post-op Assessment: Post-op Vital signs reviewed, Patient's Cardiovascular Status Stable and Respiratory Function Stable  Post-op Vital Signs: Reviewed  Filed Vitals:   03/25/15 1415  BP: 135/71  Pulse: 79  Temp:   Resp: 18    Complications: No apparent anesthesia complications

## 2015-03-25 NOTE — Interval H&P Note (Signed)
History and Physical Interval Note:  03/25/2015 11:40 AM  Wanda Moore  has presented today for surgery, with the diagnosis of left breast cancer  The various methods of treatment have been discussed with the patient and family.  Her husband is with her.  I have identified the seed in the breast with the Neoprobe.  She has a rash below her left breast.  Normally I told her I would have waited until this rash heals.  I think that the rash does increase her chance of wound infection.  But with the seed in place, I don't think that the rash would be significantly better in the 5 days that I have to get the seed out.  We will go ahead with surgery.  After consideration of risks, benefits and other options for treatment, the patient has consented to  Procedure(s): LEFT BREAST LUMPECTOMY WITH RADIOACTIVE SEED AND LEFT AXILLARY SENTINEL LYMPH NODE BIOPSY (Left) as a surgical intervention .  The patient's history has been reviewed, patient examined, no change in status, stable for surgery.  I have reviewed the patient's chart and labs.  Questions were answered to the patient's satisfaction.     Shawnice Tilmon H

## 2015-03-26 ENCOUNTER — Encounter (HOSPITAL_BASED_OUTPATIENT_CLINIC_OR_DEPARTMENT_OTHER): Payer: Self-pay | Admitting: Surgery

## 2015-03-26 NOTE — Addendum Note (Signed)
Addendum  created 03/26/15 1321 by Ernesta Amble Alekxander Isola, CRNA   Modules edited: Charges VN

## 2015-03-30 ENCOUNTER — Encounter (HOSPITAL_BASED_OUTPATIENT_CLINIC_OR_DEPARTMENT_OTHER): Payer: Self-pay | Admitting: Surgery

## 2015-04-01 ENCOUNTER — Ambulatory Visit (HOSPITAL_BASED_OUTPATIENT_CLINIC_OR_DEPARTMENT_OTHER): Payer: BLUE CROSS/BLUE SHIELD | Admitting: Hematology and Oncology

## 2015-04-01 ENCOUNTER — Encounter: Payer: Self-pay | Admitting: *Deleted

## 2015-04-01 ENCOUNTER — Telehealth: Payer: Self-pay | Admitting: Hematology and Oncology

## 2015-04-01 VITALS — BP 127/69 | HR 68 | Temp 98.5°F | Resp 18 | Ht 70.0 in | Wt 243.5 lb

## 2015-04-01 DIAGNOSIS — Z170 Estrogen receptor positive status [ER+]: Secondary | ICD-10-CM | POA: Diagnosis not present

## 2015-04-01 DIAGNOSIS — C50512 Malignant neoplasm of lower-outer quadrant of left female breast: Secondary | ICD-10-CM

## 2015-04-01 NOTE — Progress Notes (Signed)
Patient Care Team: Neena Rhymes, MD as PCP - General (Internal Medicine) Neena Rhymes, MD as PCP - Internal Medicine (Internal Medicine) Marchia Bond, MD as Consulting Physician (Orthopedic Surgery) Johnathan Hausen, MD as Consulting Physician (General Surgery) Druscilla Brownie, MD as Consulting Physician (Dermatology) Harle Battiest, MD as Consulting Physician (Obstetrics and Gynecology) Barbaraann Cao, OD as Referring Physician (Optometry) Sable Feil, MD as Consulting Physician (Gastroenterology) Alphonsa Overall, MD as Consulting Physician (General Surgery) Nicholas Lose, MD as Consulting Physician (Hematology and Oncology) Thea Silversmith, MD as Consulting Physician (Radiation Oncology) Mauro Kaufmann, RN as Registered Nurse Rockwell Germany, RN as Registered Nurse Holley Bouche, NP as Nurse Practitioner (Nurse Practitioner)  DIAGNOSIS: Breast cancer of lower-outer quadrant of left female breast   Staging form: Breast, AJCC 7th Edition     Clinical stage from 03/17/2015: Stage IA (T1b, N0, M0) - Unsigned       Staging comments: Staged at breast conference on 5.11.16    SUMMARY OF ONCOLOGIC HISTORY:   Breast cancer of lower-outer quadrant of left female breast   03/08/2015 Mammogram Mild increased in the small group of faint pleomorphic calcifications located within the lower outer quadrant left breast spanning 9 mm   03/11/2015 Initial Diagnosis Left breast biopsy lower Outer quadrant: 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 She reports that she has some soreness under the arm but otherwise doing well.  REVIEW OF SYSTEMS:   Constitutional: Denies fevers, chills or abnormal weight loss Eyes: Denies blurriness of vision Ears, nose, mouth, throat, and face: Denies mucositis or sore throat Respiratory: Denies cough, dyspnea or wheezes Cardiovascular: Denies palpitation, chest discomfort or lower extremity swelling Gastrointestinal:  Denies nausea, heartburn or change in bowel habits Skin: Denies abnormal skin rashes Lymphatics: Denies new lymphadenopathy or easy bruising Neurological:Denies numbness, tingling or new weaknesses Behavioral/Psych: Mood is stable, no new changes  Breast: Soreness under the arm from recent surgery All other systems were reviewed with the patient and are negative.  I have reviewed the past medical history, past surgical history, social history and family history with the patient and they are unchanged from previous note.  ALLERGIES:  is allergic to buprenex.  MEDICATIONS:  Current Outpatient Prescriptions  Medication Sig Dispense Refill  . acetaminophen (TYLENOL) 325 MG tablet Take 650 mg by mouth every 6 (six) hours as needed.    Marland Kitchen HYDROcodone-acetaminophen (NORCO/VICODIN) 5-325 MG per tablet Take 1-2 tablets by mouth every 6 (six) hours as needed. 30 tablet 0   No current facility-administered medications for this visit.    PHYSICAL EXAMINATION: ECOG PERFORMANCE STATUS: 1 - Symptomatic but completely ambulatory  Filed Vitals:   04/01/15 1122  BP: 127/69  Pulse: 68  Temp: 98.5 F (36.9 C)  Resp: 18   Filed Weights   04/01/15 1122  Weight: 243 lb 8 oz (110.451 kg)    GENERAL:alert, no distress and comfortable SKIN: skin color, texture, turgor are normal, no rashes or significant lesions EYES: normal, Conjunctiva are pink and non-injected, sclera clear OROPHARYNX:no exudate, no  erythema and lips, buccal mucosa, and tongue normal   NECK: supple, thyroid normal size, non-tender, without nodularity LYMPH:  no palpable lymphadenopathy in the cervical, axillary or inguinal LUNGS: clear to auscultation and percussion with normal breathing effort HEART: regular rate & rhythm and no murmurs and no lower extremity edema ABDOMEN:abdomen soft, non-tender and normal bowel sounds Musculoskeletal:no cyanosis of digits and no clubbing  NEURO: alert & oriented x 3 with fluent speech, no focal motor/sensory deficits  LABORATORY DATA:  I have reviewed the data as listed   Chemistry      Component Value Date/Time   NA 142 03/17/2015 0833   NA 139 08/12/2014 1234   K 3.7 03/17/2015 0833   K 4.0 08/12/2014 1234   CL 106 08/12/2014 1234   CO2 24 03/17/2015 0833   CO2 21 08/12/2014 1234   BUN 8.7 03/17/2015 0833   BUN 7 08/12/2014 1234   CREATININE 0.7 03/17/2015 0833   CREATININE 0.5 08/12/2014 1234      Component Value Date/Time   CALCIUM 8.7 03/17/2015 0833   CALCIUM 8.9 08/12/2014 1234   ALKPHOS 65 03/17/2015 0833   ALKPHOS 63 04/16/2014 1113   AST 15 03/17/2015 0833   AST 20 04/16/2014 1113   ALT 17 03/17/2015 0833   ALT 20 04/16/2014 1113   BILITOT 0.46 03/17/2015 0833   BILITOT 0.3 04/16/2014 1113       Lab Results  Component Value Date   WBC 8.0 03/17/2015   HGB 14.9 03/25/2015   HCT 37.0 03/17/2015   MCV 83.0 03/17/2015   PLT 233 03/17/2015   NEUTROABS 5.0 03/17/2015   ASSESSMENT & PLAN:  Breast cancer of lower-outer quadrant of left female breast 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  Recommendation: 1. Genetic testing is pending 2. Oncotype DX testing 3. Adjuvant radiation  therapy  4. followed by adjuvant antiestrogen therapy  Return to clinic after Oncotype DX results are available.  No orders of the defined types were placed in this encounter.   The study last name is reportedly still was in 2000 and asked he used to haveThe patient has a good understanding of the overall plan. she agrees with it. she will call with any problems that may develop before the next visit here.   Rulon Eisenmenger, MD

## 2015-04-01 NOTE — Telephone Encounter (Signed)
Appointments made and avs printed for patient °

## 2015-04-01 NOTE — Progress Notes (Signed)
Ordered oncotype per Dr. [name].  Faxed PA to [employer] and genomic health.  Faxed requisition to pathology and confirmed receipt with [facility].

## 2015-04-01 NOTE — Assessment & Plan Note (Signed)
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  Recommendation: 1. Genetic testing is pending 2. Oncotype DX testing depending on the final tumor size to determine chemotherapy benefit 3. Adjuvant radiation therapy  4. followed by adjuvant antiestrogen therapy  Return to clinic after Oncotype DX results are available.

## 2015-04-13 ENCOUNTER — Telehealth: Payer: Self-pay | Admitting: Genetic Counselor

## 2015-04-13 ENCOUNTER — Encounter: Payer: Self-pay | Admitting: Genetic Counselor

## 2015-04-13 ENCOUNTER — Encounter (HOSPITAL_COMMUNITY): Payer: Self-pay

## 2015-04-13 ENCOUNTER — Telehealth: Payer: Self-pay | Admitting: *Deleted

## 2015-04-13 ENCOUNTER — Telehealth: Payer: Self-pay | Admitting: Hematology and Oncology

## 2015-04-13 DIAGNOSIS — C439 Malignant melanoma of skin, unspecified: Secondary | ICD-10-CM

## 2015-04-13 DIAGNOSIS — C50512 Malignant neoplasm of lower-outer quadrant of left female breast: Secondary | ICD-10-CM

## 2015-04-13 DIAGNOSIS — Z803 Family history of malignant neoplasm of breast: Secondary | ICD-10-CM

## 2015-04-13 NOTE — Progress Notes (Signed)
Wanda Moore recently had cancer genetic counseling at [facility] on 03/18/2015. At that time, it was recommended she pursue genetic testing. Her BRCAplus gene panel test, which was performed at [facility], has returned and is negative for mutations. The BRCAplus gene panel offered by [employer] includes sequencing and rearrangement analysis for the following 6 genes: BRCA1, BRCA2, CDH1, PALB2, PTEN, and TP53. 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 At this time, it is unknown if this variant is associated with an increased risk for cancer or if this is a normal finding. With time, we suspect the lab will reclassify this variant and when they do, we will try to re-contact [name] to discuss the reclassification further.    These results were disclosed to her today. Per her request, reflex testing for the remaining genes on the BreastNext gene panel at [facility] was initiated. The BreastNext gene panel offered by [employer] includes sequencing and rearrangement analysis for the following 17 genes: ATM, BARD1, BRCA1, BRCA2, BRIP1, CDH1, CHEK2, MRE11A, MUTYH, NBN, NF1, PALB2, PTEN, RAD50, RAD51C, RAD51D, and TP53. Results for the remaining genes on the gene panel should be available in 2-3 more weeks and we will contact her to discuss these results and recommendations warranted by these results, once available.

## 2015-04-13 NOTE — Telephone Encounter (Signed)
LM on my VM looking for test results.  Emailed and sent inbasket note to [employer].

## 2015-04-13 NOTE — Telephone Encounter (Signed)
Spoke with patient and she is aware of her 52month apponitment

## 2015-04-13 NOTE — Telephone Encounter (Signed)
Received oncotype score of 13/8%. No chemo needed per Dr. [name]. Called pt with score, referred to Dr. [name] for xrt and cancelled appt with Dr. [name] on 6/27. Pt denies needs at this time. Discussed next steps in treatment. Received verbal understanding.

## 2015-04-19 NOTE — Progress Notes (Signed)
Location of Breast Cancer:Stage 1 left breast cancer lower-outer quadrant.  Histology per Pathology Report: 03/25/15 1. Breast, lumpectomy, Left - INVASIVE DUCTAL CARCINOMA, SEE COMMENT. - NEGATIVE FOR LYMPH VASCULAR INVASION. - DUCTAL CARCINOMA IN SITU WITH NECROSIS AND CALCIFICATIONS. - IN SITU CARCINOMA IS 0.3 CM FROM NEAREST MARGIN (INFERIOR). - PREVIOUS BIOPSY SITE. - SEE TUMOR SYNOPTIC TEMPLATE BELOW. 2. Breast, excision, Left posterior medial margin - BENIGN BREAST TISSUE, SEE COMMENT. - NEGATIVE FOR ATYPIA OR MALIGNANCY. - PREVIOUS BIOPSY SITE TISSUE CHANGES. 3. Lymph node, sentinel, biopsy, Left axillary - ONE LYMPH NODE, NEGATIVE FOR TUMOR (0/1).  Receptor Status: ER(+), PR (+), Her2-neu (-)  Did patient present with symptoms (if so, please note symptoms) or was this found on screening mammography?:found during routine mammography on 03/08/15.Revealed via breast biopsy on 03/11/15.   Past/Anticipated interventions by surgeon, if any:03/25/15 BREAST LUMPECTOMY WITH RADIOACTIVE SEED AND SENTINEL LYMPH NODE BIOPSY  Past/Anticipated interventions by medical oncology, if any: Chemotherapy.Reccomendation adjuvant radiation followed by antiestrogen therapy. Genetic Testing Oncotype Score 13:June 1,2016.  Lymphedema issues, if any:No  Pain issues, if GZF:POIPPGFQ  SAFETY ISSUES:  Prior radiation? No  Pacemaker/ICD?No  Possible current pregnancy?No  Is the patient on methotrexate?No  Current Complaints / other details:Married. Menarche 12.2 full-term pregnancies. 2 daughters ages 5 and 45.First live birth age 89.Took birth control pills x 20 years.Hormone shots for 2 to 3 years. History of melanoma, Barrettes.  Sister diagnosed with cancer age 47. No smoking history Allergies:buprenex    Arlyss Repress, RN 04/19/2015,12:39 PM

## 2015-04-21 ENCOUNTER — Ambulatory Visit
Admission: RE | Admit: 2015-04-21 | Discharge: 2015-04-21 | Disposition: A | Discharge status: Home / Self Care | Payer: BLUE CROSS/BLUE SHIELD | Source: Ambulatory Visit | Attending: Radiation Oncology | Admitting: Radiation Oncology

## 2015-04-21 ENCOUNTER — Encounter: Payer: Self-pay | Admitting: Radiation Oncology

## 2015-04-21 VITALS — BP 126/65 | HR 68 | Temp 98.4°F | Resp 12 | Wt 247.8 lb

## 2015-04-21 DIAGNOSIS — C50512 Malignant neoplasm of lower-outer quadrant of left female breast: Secondary | ICD-10-CM | POA: Diagnosis not present

## 2015-04-21 NOTE — Progress Notes (Signed)
   Department of Radiation Oncology  Phone:  [phone] Fax:        [phone]   Name: Wanda Moore MRN: [id number]  DOB: [birth year]  Date: 04/21/2015  Follow Up Visit Note  Diagnosis: Breast cancer of lower-outer quadrant of left female breast   Staging form: Breast, AJCC 7th Edition     Clinical stage from 03/17/2015: Stage IA (T1b, N0, M0) - Unsigned       Staging comments: Staged at breast conference on 5.11.16      Pathologic stage from 03/29/2015: Stage IIA (T2, N0, cM0) - Signed by [name], MD on 04/12/2015       Staging comments: Staged on final lumpectomy specimen by [name]   Interval History: Wanda Moore presents today for routine followup.  She had a lumpectomy on 5/19. This showed a 2.1cm invasive ductal carcinoma with negative margins. A single sentinal node was negative. Oncotype was low and will not require chemotherapy. She has healed up well and is ready to procede with radiation. She has a small stitch on lumpectomy incision that seems to have come loose. She is otherwise healing well.  Physical Exam:  Filed Vitals:   04/21/15 1308  BP: 126/65  Pulse: 68  Temp: 98.4 F (36.9 C)  TempSrc: Oral  Resp: 12  Weight: 247 lb 12.8 oz (112.401 kg)  SpO2: 99%   Well healed sentinel lymph node and lumpectomy incision with no signs of infection. 3 mm of suture visible at lateral aspect of the lumpectomy incision which I trimmed back.   IMPRESSION: Wanda Moore is a 54 y.o. female with T2N0 left breast cancer.  PLAN:  We discussed the role of radiation and decreasing local failures in patients who undergo lumpectomy. We discussed the retrospective data showing an increase in failure rates in patients who have a pathologic complete response and did not undergo radiation. For this reason I have recommended radiation to the whole breast followed by boost to the tumor bed. We discussed the process of simulation and the placement of tattoos. We discussed possible side effects  during treatment including but not limited to skin irritation darkness and fatigue. We discussed long-term effects of treatment which are extremely unlikely but possible including damage to the lungs and ribs.  We discussed the use of breath hold technique for cardiac sparing if necessary. We discussed the low likelihood of secondary malignancies. We discussed the possible late side effects including but not limited to permanent skin darkening, and breast swelling.  Scheduled for simulation on the 04/22/15.     Thea Silversmith, MD  This document serves as a record of services personally performed by Thea Silversmith, MD. It was created on her behalf by Derek Mound, a trained medical scribe. The creation of this record is based on the scribe's personal observations and the provider's statements to them. This document has been checked and approved by the attending provider.

## 2015-04-22 ENCOUNTER — Encounter: Payer: Self-pay | Admitting: *Deleted

## 2015-04-22 ENCOUNTER — Ambulatory Visit: Payer: BLUE CROSS/BLUE SHIELD

## 2015-04-22 ENCOUNTER — Ambulatory Visit
Admission: RE | Admit: 2015-04-22 | Discharge: 2015-04-22 | Disposition: A | Discharge status: Home / Self Care | Payer: BLUE CROSS/BLUE SHIELD | Source: Ambulatory Visit | Attending: Radiation Oncology | Admitting: Radiation Oncology

## 2015-04-22 DIAGNOSIS — C50512 Malignant neoplasm of lower-outer quadrant of left female breast: Secondary | ICD-10-CM | POA: Diagnosis not present

## 2015-04-22 NOTE — Progress Notes (Signed)
[place] Psychosocial Distress Screening Clinical Social Work  Clinical Social Work was referred by distress screening protocol.  The patient scored a 6 on the Psychosocial Distress Thermometer which indicates moderate distress. Clinical Social Worker phoned pt to assess for distress and other psychosocial needs. Pt reports to have an [employer] and has attended support group. She felt both of those were helpful and plans to attend group again soon. Pt allowed to process anxieties related to treatment and seems to have good coping techniques, resources and supports. Pt educated about availability of CSW team for further support as well. She also may be interested in [facility] after treatment. Pt aware to reach out as needed and appreciated call.   ONCBCN DISTRESS SCREENING 04/21/2015  Screening Type Initial Screening  Distress experienced in past week (1-10) 6  Practical problem type   Emotional problem type Nervousness/Anxiety  Physical Problem type   Physician notified of physical symptoms   Referral to clinical psychology   Referral to clinical social work   Referral to dietition   Referral to financial advocate   Referral to support programs   Referral to palliative care     Clinical Social Worker follow up needed: No.  If yes, follow up plan: Loren Racer, [place]  [facility] Phone: [phone] Fax: [phone]

## 2015-04-22 NOTE — Progress Notes (Signed)
Name: Alsha Meland   MRN: [id number]  Date:  04/22/2015  DOB: 05-25-1961  Status:outpatient   DIAGNOSIS: Left Breast cancer.  CONSENT VERIFIED: yes SET UP: Patient is setup supine  IMMOBILIZATION:  The following immobilization was used:Custom Moldable Pillow, breast board.  NARRATIVE: Ms. Godeaux was brought to the [place].  Identity was confirmed.  All relevant records and images related to the planned course of therapy were reviewed.  Then, the patient was positioned in a stable reproducible clinical set-up for radiation therapy.  Wires were placed to delineate the clinical extent of breast tissue. A wire was placed on the scar as well.  CT images were obtained.  An isocenter was placed. Skin markings were placed.  The position of the heart was then analyzed.  Due to the proximity of the heart to the chest wall, I felt she would benefit from deep inspiration breath hold for cardiac sparing.  She was then coached and rescanned in the breath hold position.  Acceptable cardiac sparing was achieved. The CT images were loaded into the planning software where the target and avoidance structures were contoured.  The radiation prescription was entered and confirmed. The patient was discharged in stable condition and tolerated simulation well.    TREATMENT PLANNING NOTE/3D Simulation Note Treatment planning then occurred. I have requested : MLC's, isodose plan, basic dose calculation  3D simulation was performed.  I personally designed and supervised the construction of 3 medically necessary complex treatment devices in the form of MLCs which will be used for beam modification and to protect critical structures including the heart and lung as well as the immobilization device which is necessary for reproducible set up.  I have requested a dose volume histogram of the heart, lung and tumor cavity.    RESPIRATORY MOTION MANAGEMENT SIMULATION - Deep Inspiration Breath Hold  NARRATIVE:   In order to account for effect of respiratory motion on target structures and other organs in the planning and delivery of radiotherapy, this patient underwent respiratory motion management simulation.  To accomplish this, when the patient was brought to the CT simulation planning suite, a bellows was placed on the her abdomen.  Wave forms of the patient's breathing were obtained. Coaching was performed and practice sessions initiated to monitor her ability to obtain and maintain deep inspiration breath hold.  The CT images were loaded into the planning software and fused with her free breathing images by physics.  Acceptable cardiac sparing was achieved through the use of deep inspiration breath hold.  Planning will be performed on her breath hold scan  This document serves as a record of services personally performed by Thea Silversmith, MD. It was created on her behalf by Darcus Austin, a trained medical scribe. The creation of this record is based on the scribe's personal observations and the provider's statements to them. This document has been checked and approved by the attending provider.

## 2015-04-22 NOTE — Progress Notes (Signed)
Radiation Oncology         [phone] ________________________________  Name: Wanda Moore      MRN: [id number]          Date: 04/22/2015              DOB: [birth year]  Optical Surface Tracking Plan:  Since intensity modulated radiotherapy (IMRT) and 3D conformal radiation treatment methods are predicated on accurate and precise positioning for treatment, intrafraction motion monitoring is medically necessary to ensure accurate and safe treatment delivery.  The ability to quantify intrafraction motion without excessive ionizing radiation dose can only be performed with optical surface tracking. Accordingly, surface imaging offers the opportunity to obtain 3D measurements of patient position throughout IMRT and 3D treatments without excessive radiation exposure.  I am ordering optical surface tracking for this patient's upcoming course of radiotherapy. ________________________________ Thea Silversmith, MD   Reference:   Particia Jasper, et al. Surface imaging-based analysis of intrafraction motion for breast radiotherapy patients.Journal of [place], n. 6, nov. 2014. ISSN [id number].   Available at: <http://www.jacmp.org/index.php/jacmp/article/view/4957>.   This document serves as a record of services personally performed by Thea Silversmith, MD. It was created on her behalf by Darcus Austin, a trained medical scribe. The creation of this record is based on the scribe's personal observations and the provider's statements to them. This document has been checked and approved by the attending provider.

## 2015-04-26 ENCOUNTER — Encounter: Payer: Self-pay | Admitting: Genetic Counselor

## 2015-04-26 DIAGNOSIS — Z803 Family history of malignant neoplasm of breast: Secondary | ICD-10-CM

## 2015-04-26 DIAGNOSIS — C439 Malignant melanoma of skin, unspecified: Secondary | ICD-10-CM

## 2015-04-26 DIAGNOSIS — C50912 Malignant neoplasm of unspecified site of left female breast: Secondary | ICD-10-CM

## 2015-04-26 NOTE — Progress Notes (Signed)
[facility] Genetic Test Results   REFERRING PROVIDER: Dr. [name]  PRIMARY PROVIDER:  No primary care provider on file.  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 Please refer to our prior cancer genetics clinic note for more information regarding Wanda Moore's medical, social and family histories, and our assessment and recommendations, at the time. Wanda Moore genetic test results and recommendations warranted by these results were recently disclosed to her and are discussed in more detail below.  GENETIC TEST RESULTS: At the time of Wanda Moore visit, we recommended she pursue genetic testing, which includes sequencing and deletion/duplication analysis of several genes associated with an increased risk for cancer via a gene panel. The BreastNext gene panel offered by [employer] includes sequencing and rearrangement analysis for the following 17 genes: ATM, BARD1, BRCA1, BRCA2, BRIP1, CDH1, CHEK2, MRE11A, MUTYH, NBN, NF1, PALB2, PTEN, RAD50, RAD51C, RAD51D, and TP53. Genetic testing for this gene panel was normal and did not reveal a pathogenic mutation in any of these genes. A copy of the genetic test report will be scanned into Epic under the media tab.  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 At this time, it is unknown if this variant is associated with an increased risk for cancer or if this is a normal finding. With time, we suspect the lab will reclassify this variant and when they do, we will try to  re-contact Wanda Moore to discuss the reclassification further.   We discussed with Wanda Moore that current genetic testing is not perfect, and it is, therefore, possible there may be a pathogenic gene mutation in one of these genes that current testing cannot detect, but that chance is small.  We also discussed, that it is possible that another gene that has not yet been discovered, or that we have not yet tested, is responsible for the cancer diagnoses in her family. It is, therefore, important for Wanda Moore to continue to remain in touch with cancer genetics so that we can continue to offer Wanda Moore the most up to date genetic testing.   CANCER SCREENING RECOMMENDATIONS: This result is reassuring and indicates that Wanda Moore likely does not have an increased risk for a future cancer due to a mutation in one of these genes. This normal test also suggests that Wanda Moore's cancer was most likely not due to an inherited predisposition associated with one of these genes.  Most cancers happen by chance and this negative test suggests that her cancer falls into this category.  We, therefore, recommended she continue to follow the cancer management and screening guidelines provided by her oncology and primary healthcare providers.   RECOMMENDATIONS FOR FAMILY MEMBERS:  Individuals in this family might be at some increased risk of developing cancer, over the general population risk, simply due to the family history of cancer.  We recommended women in this family have a yearly mammogram beginning at age 75, an annual clinical breast exam, and perform monthly breast self-exams. Women in this family should also have a gynecological exam as recommended by their primary provider. All family members should have a colonoscopy by age  50, an annual dermatological exam and continue to follow cancer screening guidelines recommended by their healthcare provider. We also recommended Wanda Moore's sister, who was diagnosed with  breast cancer at age 85, also have genetic counseling and testing and are happy to help facilitate testing and/or a referral if needed. Cancer genetic counselors can also be located, by visiting the website of the [employer] ([employer]) and Field seismologist for a Oncologist by zip code.  FOLLOW-UP: Lastly, we discussed with Wanda Moore that cancer genetics is a rapidly advancing field and it is likely that new genetic tests will be appropriate for her and/or family members in the future. We encouraged her to remain in contact with cancer genetics on an annual basis so we can update her personal and family histories and let her know of advances in cancer genetics that may benefit this family.   Our contact number was provided. Wanda Moore questions were answered to her satisfaction, and she knows she is welcome to call us at anytime with additional questions or concerns.    Catherine A. Fine, MS, CGC Certified Psychologist, [email or web address] Phone: [phone]

## 2015-04-27 DIAGNOSIS — C50512 Malignant neoplasm of lower-outer quadrant of left female breast: Secondary | ICD-10-CM | POA: Diagnosis not present

## 2015-04-28 DIAGNOSIS — C50512 Malignant neoplasm of lower-outer quadrant of left female breast: Secondary | ICD-10-CM | POA: Diagnosis not present

## 2015-04-29 ENCOUNTER — Ambulatory Visit
Admission: RE | Admit: 2015-04-29 | Discharge: 2015-04-29 | Disposition: A | Discharge status: Home / Self Care | Payer: BLUE CROSS/BLUE SHIELD | Source: Ambulatory Visit | Attending: Radiation Oncology | Admitting: Radiation Oncology

## 2015-04-29 DIAGNOSIS — C50512 Malignant neoplasm of lower-outer quadrant of left female breast: Secondary | ICD-10-CM | POA: Diagnosis not present

## 2015-05-03 ENCOUNTER — Ambulatory Visit: Payer: BLUE CROSS/BLUE SHIELD | Admitting: Hematology and Oncology

## 2015-05-03 ENCOUNTER — Ambulatory Visit
Admission: RE | Admit: 2015-05-03 | Discharge: 2015-05-03 | Disposition: A | Discharge status: Home / Self Care | Payer: BLUE CROSS/BLUE SHIELD | Source: Ambulatory Visit | Attending: Radiation Oncology | Admitting: Radiation Oncology

## 2015-05-03 DIAGNOSIS — C50512 Malignant neoplasm of lower-outer quadrant of left female breast: Secondary | ICD-10-CM | POA: Diagnosis not present

## 2015-05-04 ENCOUNTER — Ambulatory Visit
Admission: RE | Admit: 2015-05-04 | Discharge: 2015-05-04 | Disposition: A | Discharge status: Home / Self Care | Payer: BLUE CROSS/BLUE SHIELD | Source: Ambulatory Visit | Attending: Radiation Oncology | Admitting: Radiation Oncology

## 2015-05-04 VITALS — BP 131/78 | HR 73 | Temp 98.5°F | Wt 248.2 lb

## 2015-05-04 DIAGNOSIS — C50512 Malignant neoplasm of lower-outer quadrant of left female breast: Secondary | ICD-10-CM | POA: Diagnosis not present

## 2015-05-04 NOTE — Progress Notes (Signed)
  Radiation Oncology         [phone]   Name: Wanda Moore MRN: [id number]   Date: 05/04/2015  DOB: [birth year]   Weekly Radiation Therapy Management    ICD-9-CM ICD-10-CM   1. Breast cancer of lower-outer quadrant of left female breast 174.5 C50.512     Current Dose: 3.6 Gy  Planned Dose:  45 Gy  Narrative The patient presents for routine under treatment assessment. Pt here for patient teaching. Pt given Radiation and You booklet, skin care instructions, Alra deodorant and Radiaplex gel. Reviewed areas of pertinence. Pt able to give teach back of to pat skin, use unscented/gentle soap and drink plenty of water,apply Radiaplex bid, avoid applying anything to skin within 4 hours of treatment, avoid wearing an under wire bra and to use an electric razor if they must shave. Pt demonstrated understanding and verbalizes understanding of information given and will contact nursing with any questions or concerns. She had a question about a stitch that had previously been visible. The patient is without complaint. Set-up films were reviewed. The chart was checked.  Physical Findings  weight is 248 lb 3.2 oz (112.583 kg). Her temperature is 98.5 F (36.9 C). Her blood pressure is 131/78 and her pulse is 73. . Weight essentially stable.  No significant changes. The stitch was not visible upon inspection, it may have fallen away. She notes that the stitch was along the lateral aspect of her lumpectomy incision. Her incision is well intended and healed with no open area.  Impression The patient is tolerating radiation.  Plan Continue treatment as planned.    This document serves as a record of services personally performed by Tyler Pita, MD. It was created on his behalf by Arlyce Harman, a trained medical scribe. The creation of this record is based on the scribe's personal observations and the provider's statements to them. This document has been checked and approved by the attending provider.        Wanda Moore, M.D.

## 2015-05-04 NOTE — Progress Notes (Signed)
Pt here for patient teaching.  Pt given Radiation and You booklet, skin care instructions, Alra deodorant and Radiaplex gel. Reviewed areas of pertinence such as  . Pt able to give teach back of to pat skin, use unscented/gentle soap and drink plenty of water,apply Radiaplex bid, avoid applying anything to skin within 4 hours of treatment, avoid wearing an under wire bra and to use an electric razor if they must shave. Pt demonstrated understanding and verbalizes understanding of information given and will contact nursing with any questions or concerns.Weekly assessment of radiation to left breast.Completed 2 of 33 treatments.

## 2015-05-05 ENCOUNTER — Ambulatory Visit
Admission: RE | Admit: 2015-05-05 | Discharge: 2015-05-05 | Disposition: A | Discharge status: Home / Self Care | Payer: BLUE CROSS/BLUE SHIELD | Source: Ambulatory Visit | Attending: Radiation Oncology | Admitting: Radiation Oncology

## 2015-05-05 DIAGNOSIS — C50512 Malignant neoplasm of lower-outer quadrant of left female breast: Secondary | ICD-10-CM | POA: Diagnosis not present

## 2015-05-06 ENCOUNTER — Ambulatory Visit
Admission: RE | Admit: 2015-05-06 | Discharge: 2015-05-06 | Disposition: A | Discharge status: Home / Self Care | Payer: BLUE CROSS/BLUE SHIELD | Source: Ambulatory Visit | Attending: Radiation Oncology | Admitting: Radiation Oncology

## 2015-05-06 DIAGNOSIS — C50512 Malignant neoplasm of lower-outer quadrant of left female breast: Secondary | ICD-10-CM | POA: Diagnosis not present

## 2015-05-07 ENCOUNTER — Ambulatory Visit
Admission: RE | Admit: 2015-05-07 | Discharge: 2015-05-07 | Disposition: A | Discharge status: Home / Self Care | Payer: BLUE CROSS/BLUE SHIELD | Source: Ambulatory Visit | Attending: Radiation Oncology | Admitting: Radiation Oncology

## 2015-05-07 DIAGNOSIS — C50512 Malignant neoplasm of lower-outer quadrant of left female breast: Secondary | ICD-10-CM | POA: Diagnosis not present

## 2015-05-11 ENCOUNTER — Ambulatory Visit
Admission: RE | Admit: 2015-05-11 | Discharge: 2015-05-11 | Disposition: A | Discharge status: Home / Self Care | Payer: BLUE CROSS/BLUE SHIELD | Source: Ambulatory Visit | Attending: Radiation Oncology | Admitting: Radiation Oncology

## 2015-05-11 ENCOUNTER — Encounter: Payer: Self-pay | Admitting: Radiation Oncology

## 2015-05-11 VITALS — BP 129/67 | HR 70 | Temp 98.2°F | Resp 20 | Wt 248.3 lb

## 2015-05-11 DIAGNOSIS — C50512 Malignant neoplasm of lower-outer quadrant of left female breast: Secondary | ICD-10-CM

## 2015-05-11 NOTE — Progress Notes (Signed)
Weekly Management Note Current Dose: 10.8  Gy  Projected Dose: 61 Gy   Narrative:  The patient presents for routine under treatment assessment.  CBCT/MVCT images/Port film x-rays were reviewed.  The chart was checked. Doing well. Left breast feels heavy and swollen. Achey at night. No fevers or chills.   Physical Findings: Weight: 248 lb 4.8 oz (112.628 kg). Swollen left breast. Slightly pink.   Impression:  The patient is tolerating radiation.  Plan:  Continue treatment as planned. Doxcycline for a week to cover for infection. Call with worsening pain or fevers.

## 2015-05-11 NOTE — Progress Notes (Addendum)
Weekly rad txs left breast 6/33, slight erythema only, radiaplex bid, appetite good, staying hydrated, no pain, using the crystal mist unstead of alra deodorant, radiaplex bid to breast 10:38 AM BP 129/67 mmHg  Pulse 70  Temp(Src) 98.2 F (36.8 C) (Oral)  Resp 20  Wt 248 lb 4.8 oz (112.628 kg)  Wt Readings from Last 3 Encounters:  05/11/15 248 lb 4.8 oz (112.628 kg)  05/04/15 248 lb 3.2 oz (112.583 kg)  04/21/15 247 lb 12.8 oz (112.401 kg)

## 2015-05-12 ENCOUNTER — Ambulatory Visit
Admission: RE | Admit: 2015-05-12 | Discharge: 2015-05-12 | Disposition: A | Discharge status: Home / Self Care | Payer: BLUE CROSS/BLUE SHIELD | Source: Ambulatory Visit | Attending: Radiation Oncology | Admitting: Radiation Oncology

## 2015-05-12 ENCOUNTER — Other Ambulatory Visit: Payer: Self-pay | Admitting: Radiation Oncology

## 2015-05-12 DIAGNOSIS — C50512 Malignant neoplasm of lower-outer quadrant of left female breast: Secondary | ICD-10-CM | POA: Diagnosis not present

## 2015-05-12 MED ORDER — DOXYCYCLINE HYCLATE 100 MG PO TABS: 100.0000 mg | ORAL_TABLET | Freq: Two times a day (BID) | ORAL | Status: DC

## 2015-05-13 ENCOUNTER — Ambulatory Visit
Admission: RE | Admit: 2015-05-13 | Discharge: 2015-05-13 | Disposition: A | Discharge status: Home / Self Care | Payer: BLUE CROSS/BLUE SHIELD | Source: Ambulatory Visit | Attending: Radiation Oncology | Admitting: Radiation Oncology

## 2015-05-13 ENCOUNTER — Telehealth: Payer: Self-pay | Admitting: *Deleted

## 2015-05-13 DIAGNOSIS — C50512 Malignant neoplasm of lower-outer quadrant of left female breast: Secondary | ICD-10-CM | POA: Diagnosis not present

## 2015-05-13 NOTE — Telephone Encounter (Signed)
Left vm for pt to return call to assess needs during xrt. Contact information given.

## 2015-05-14 ENCOUNTER — Ambulatory Visit
Admission: RE | Admit: 2015-05-14 | Discharge: 2015-05-14 | Disposition: A | Discharge status: Home / Self Care | Payer: BLUE CROSS/BLUE SHIELD | Source: Ambulatory Visit | Attending: Radiation Oncology | Admitting: Radiation Oncology

## 2015-05-14 DIAGNOSIS — C50512 Malignant neoplasm of lower-outer quadrant of left female breast: Secondary | ICD-10-CM | POA: Diagnosis not present

## 2015-05-17 ENCOUNTER — Ambulatory Visit
Admission: RE | Admit: 2015-05-17 | Discharge: 2015-05-17 | Disposition: A | Discharge status: Home / Self Care | Payer: BLUE CROSS/BLUE SHIELD | Source: Ambulatory Visit | Attending: Radiation Oncology | Admitting: Radiation Oncology

## 2015-05-17 DIAGNOSIS — C50512 Malignant neoplasm of lower-outer quadrant of left female breast: Secondary | ICD-10-CM | POA: Diagnosis not present

## 2015-05-18 ENCOUNTER — Telehealth: Payer: Self-pay | Admitting: *Deleted

## 2015-05-18 ENCOUNTER — Ambulatory Visit
Admission: RE | Admit: 2015-05-18 | Discharge: 2015-05-18 | Disposition: A | Discharge status: Home / Self Care | Payer: BLUE CROSS/BLUE SHIELD | Source: Ambulatory Visit | Attending: Radiation Oncology | Admitting: Radiation Oncology

## 2015-05-18 VITALS — BP 125/65 | HR 75 | Temp 97.9°F | Wt 250.6 lb

## 2015-05-18 DIAGNOSIS — C50512 Malignant neoplasm of lower-outer quadrant of left female breast: Secondary | ICD-10-CM

## 2015-05-18 NOTE — Progress Notes (Signed)
Weekly assessment of radiation to left breast.Completed 11 of 25 fractions.Had increased shooting pain last night relieved with hydrocodone.Skin is mildly discolored with start of follicular rash.Continue application of radiaplex.

## 2015-05-18 NOTE — Progress Notes (Signed)
Weekly Management Note Current Dose: 19.8  Gy  Projected Dose: 61 Gy   Narrative:  The patient presents for routine under treatment assessment.  CBCT/MVCT images/Port film x-rays were reviewed.  The chart was checked. More swelling and pain. Hydrocodone last night to sleep. Taking doxycycline.   Physical Findings: Weight: 250 lb 9.6 oz (113.671 kg). Left breast swelling. Minimal redness around the nipple.  Impression:  The patient is tolerating radiation.  Plan:  Continue treatment as planned. [name]. Continue doxycycline.

## 2015-05-18 NOTE — Telephone Encounter (Signed)
Called patient to inform of appt. With Dr. Alphonsa Overall on 05/20/15 @ 4:30. Pm, patient to arrive @ 4:15 pm, spoke with patient and she is aware of this appt.

## 2015-05-19 ENCOUNTER — Ambulatory Visit
Admission: RE | Admit: 2015-05-19 | Discharge: 2015-05-19 | Disposition: A | Discharge status: Home / Self Care | Payer: BLUE CROSS/BLUE SHIELD | Source: Ambulatory Visit | Attending: Radiation Oncology | Admitting: Radiation Oncology

## 2015-05-19 DIAGNOSIS — C50512 Malignant neoplasm of lower-outer quadrant of left female breast: Secondary | ICD-10-CM | POA: Diagnosis not present

## 2015-05-20 ENCOUNTER — Ambulatory Visit
Admission: RE | Admit: 2015-05-20 | Discharge: 2015-05-20 | Disposition: A | Discharge status: Home / Self Care | Payer: BLUE CROSS/BLUE SHIELD | Source: Ambulatory Visit | Attending: Radiation Oncology | Admitting: Radiation Oncology

## 2015-05-20 DIAGNOSIS — C50512 Malignant neoplasm of lower-outer quadrant of left female breast: Secondary | ICD-10-CM | POA: Diagnosis not present

## 2015-05-21 ENCOUNTER — Ambulatory Visit
Admission: RE | Admit: 2015-05-21 | Discharge: 2015-05-21 | Disposition: A | Discharge status: Home / Self Care | Payer: BLUE CROSS/BLUE SHIELD | Source: Ambulatory Visit | Attending: Radiation Oncology | Admitting: Radiation Oncology

## 2015-05-21 DIAGNOSIS — C50512 Malignant neoplasm of lower-outer quadrant of left female breast: Secondary | ICD-10-CM | POA: Diagnosis not present

## 2015-05-24 ENCOUNTER — Ambulatory Visit
Admission: RE | Admit: 2015-05-24 | Discharge: 2015-05-24 | Disposition: A | Discharge status: Home / Self Care | Payer: BLUE CROSS/BLUE SHIELD | Source: Ambulatory Visit | Attending: Radiation Oncology | Admitting: Radiation Oncology

## 2015-05-24 DIAGNOSIS — C50512 Malignant neoplasm of lower-outer quadrant of left female breast: Secondary | ICD-10-CM | POA: Diagnosis not present

## 2015-05-25 ENCOUNTER — Ambulatory Visit
Admission: RE | Admit: 2015-05-25 | Discharge: 2015-05-25 | Disposition: A | Discharge status: Home / Self Care | Payer: BLUE CROSS/BLUE SHIELD | Source: Ambulatory Visit | Attending: Radiation Oncology | Admitting: Radiation Oncology

## 2015-05-25 VITALS — BP 130/71 | HR 72 | Temp 98.0°F | Wt 240.0 lb

## 2015-05-25 DIAGNOSIS — C50512 Malignant neoplasm of lower-outer quadrant of left female breast: Secondary | ICD-10-CM | POA: Diagnosis not present

## 2015-05-25 NOTE — Progress Notes (Signed)
Weekly assessment of radiation to left breast.day 16 of 33.Denies pain or fatigue.has follicular rash in mid chest.applying hydrocortisone.and takes tylenol for discomfort.continue radiaplex.

## 2015-05-25 NOTE — Addendum Note (Signed)
Encounter addended by: Thea Silversmith, MD on: 05/25/2015 11:54 AM<BR>     Documentation filed: Notes Section

## 2015-05-25 NOTE — Progress Notes (Addendum)
Weekly Management Note Current Dose:   28.8 Gy  Projected Dose: 61 Gy   Narrative:  The patient presents for routine under treatment assessment.  CBCT/MVCT images/Port film x-rays were reviewed.  The chart was checked. Feeling better since fluid drawn off but it is recollecting. No fevers.   Physical Findings: Weight: 240 lb (108.863 kg). Left breast is slightly swollen and tight. Pink.   Impression:  The patient is tolerating radiation.  Plan:  Continue treatment as planned. We discussed that Dr. [name] would like to get her through RT before draining again but if it becomes extremely painful, he can drain. We will sim her Thursday for her boost volumes.

## 2015-05-26 ENCOUNTER — Ambulatory Visit
Admission: RE | Admit: 2015-05-26 | Discharge: 2015-05-26 | Disposition: A | Discharge status: Home / Self Care | Payer: BLUE CROSS/BLUE SHIELD | Source: Ambulatory Visit | Attending: Radiation Oncology | Admitting: Radiation Oncology

## 2015-05-26 DIAGNOSIS — C50512 Malignant neoplasm of lower-outer quadrant of left female breast: Secondary | ICD-10-CM | POA: Diagnosis not present

## 2015-05-27 ENCOUNTER — Ambulatory Visit
Admission: RE | Admit: 2015-05-27 | Discharge: 2015-05-27 | Disposition: A | Discharge status: Home / Self Care | Payer: BLUE CROSS/BLUE SHIELD | Source: Ambulatory Visit | Attending: Radiation Oncology | Admitting: Radiation Oncology

## 2015-05-27 DIAGNOSIS — C50512 Malignant neoplasm of lower-outer quadrant of left female breast: Secondary | ICD-10-CM

## 2015-05-28 ENCOUNTER — Ambulatory Visit
Admission: RE | Admit: 2015-05-28 | Discharge: 2015-05-28 | Disposition: A | Discharge status: Home / Self Care | Payer: BLUE CROSS/BLUE SHIELD | Source: Ambulatory Visit | Attending: Radiation Oncology | Admitting: Radiation Oncology

## 2015-05-28 DIAGNOSIS — C50512 Malignant neoplasm of lower-outer quadrant of left female breast: Secondary | ICD-10-CM | POA: Diagnosis not present

## 2015-05-31 ENCOUNTER — Ambulatory Visit
Admission: RE | Admit: 2015-05-31 | Discharge: 2015-05-31 | Disposition: A | Discharge status: Home / Self Care | Payer: BLUE CROSS/BLUE SHIELD | Source: Ambulatory Visit | Attending: Radiation Oncology | Admitting: Radiation Oncology

## 2015-05-31 DIAGNOSIS — C50512 Malignant neoplasm of lower-outer quadrant of left female breast: Secondary | ICD-10-CM | POA: Diagnosis not present

## 2015-06-01 ENCOUNTER — Ambulatory Visit: Payer: BLUE CROSS/BLUE SHIELD | Admitting: Radiation Oncology

## 2015-06-01 ENCOUNTER — Ambulatory Visit
Admission: RE | Admit: 2015-06-01 | Discharge: 2015-06-01 | Disposition: A | Discharge status: Home / Self Care | Payer: BLUE CROSS/BLUE SHIELD | Source: Ambulatory Visit | Attending: Radiation Oncology | Admitting: Radiation Oncology

## 2015-06-01 DIAGNOSIS — C50512 Malignant neoplasm of lower-outer quadrant of left female breast: Secondary | ICD-10-CM | POA: Diagnosis not present

## 2015-06-02 ENCOUNTER — Ambulatory Visit
Admission: RE | Admit: 2015-06-02 | Discharge: 2015-06-02 | Disposition: A | Discharge status: Home / Self Care | Payer: BLUE CROSS/BLUE SHIELD | Source: Ambulatory Visit | Attending: Radiation Oncology | Admitting: Radiation Oncology

## 2015-06-02 VITALS — BP 123/70 | HR 74 | Wt 249.0 lb

## 2015-06-02 DIAGNOSIS — Z510 Encounter for antineoplastic radiation therapy: Secondary | ICD-10-CM | POA: Diagnosis present

## 2015-06-02 DIAGNOSIS — C50512 Malignant neoplasm of lower-outer quadrant of left female breast: Secondary | ICD-10-CM | POA: Diagnosis not present

## 2015-06-02 MED ORDER — RADIAPLEXRX EX GEL
Freq: Once | CUTANEOUS | Status: AC
  Administered 2015-06-02: 17:00:00 via TOPICAL

## 2015-06-02 NOTE — Progress Notes (Signed)
Weekly assessment of radiation to right breast.Completed 22 of 33 treatments.Mild redness, and rash in upper mid right chest.Itching relieved with hydrocortisone 1%.Mild fatigue.Will given additional tube of radiaplex.Hot flashes are intense.

## 2015-06-02 NOTE — Progress Notes (Signed)
  Radiation Oncology         [phone] ________________________________  Name: [name] MRN: [id number]  Date: 06/02/2015  DOB: [birth year]  Weekly Radiation Therapy Management    ICD-9-CM ICD-10-CM   1. Breast cancer of lower-outer quadrant of left female breast 174.5 C50.512     Current Dose: 39.6 Gy     Planned Dose:  45+ Gy  Narrative: The patient presents for routine under treatment assessment. She has reported mild redness, a rash in upper mid right chest, with fluid build up, and symptoms of mild fatigue. "I've been having these little crampy pains every now and then," she stated while pointing to her left breat. An itching sensation is relieved with hydrocortisone 1%. Mild fatigue and intense hot flashes were also reported. The patient stated that she is still currently working. Set-up films were reviewed and the chart was checked. She reports having a seroma drained approximately 2 weeks ago  Physical Findings:  weight is 249 lb (112.946 kg). Her blood pressure is 123/70 and her pulse is 74. . Erythema noted in the breast. Hyperpigmentation changes noted also. She does have some swelling noted within the lateral aspect of the breast.  Impression: Wanda Moore is a 54 year old female presenting to clinic in regards to her cancer of the left female breast. She has completed 22 out of 33 treatments thus far. The patient is tolerating radiation.  Plan: The patient has been advised to continue treatment as planned and provided with an additional tube of Radiaplex. If the patient develops any further concerns or questions, she was encouraged to contact Dr. Sondra Come, MD, PhD.   This document serves as a record of services personally performed by Gery Pray, MD. It was created on his behalf by Lenn Cal, a trained medical scribe. The creation of this record is based on the scribe's personal observations and the provider's statements to them. This document has been checked and  approved by the attending provider.    ________________________________   Blair Promise, PhD, MD

## 2015-06-02 NOTE — Addendum Note (Signed)
Encounter addended by: Norm Salt, RN on: 06/02/2015  4:49 PM<BR>     Documentation filed: [employer], Orders

## 2015-06-02 NOTE — Addendum Note (Signed)
Encounter addended by: Jenene Slicker, RN on: 06/02/2015  4:50 PM<BR>     Documentation filed: Inpatient MAR

## 2015-06-03 ENCOUNTER — Ambulatory Visit
Admission: RE | Admit: 2015-06-03 | Discharge: 2015-06-03 | Disposition: A | Discharge status: Home / Self Care | Payer: BLUE CROSS/BLUE SHIELD | Source: Ambulatory Visit | Attending: Radiation Oncology | Admitting: Radiation Oncology

## 2015-06-03 DIAGNOSIS — C50512 Malignant neoplasm of lower-outer quadrant of left female breast: Secondary | ICD-10-CM | POA: Diagnosis not present

## 2015-06-04 ENCOUNTER — Ambulatory Visit
Admission: RE | Admit: 2015-06-04 | Discharge: 2015-06-04 | Disposition: A | Discharge status: Home / Self Care | Payer: BLUE CROSS/BLUE SHIELD | Source: Ambulatory Visit | Attending: Radiation Oncology | Admitting: Radiation Oncology

## 2015-06-04 DIAGNOSIS — C50512 Malignant neoplasm of lower-outer quadrant of left female breast: Secondary | ICD-10-CM | POA: Diagnosis not present

## 2015-06-06 ENCOUNTER — Ambulatory Visit: Payer: BLUE CROSS/BLUE SHIELD

## 2015-06-07 ENCOUNTER — Ambulatory Visit
Admission: RE | Admit: 2015-06-07 | Discharge: 2015-06-07 | Disposition: A | Discharge status: Home / Self Care | Payer: BLUE CROSS/BLUE SHIELD | Source: Ambulatory Visit | Attending: Radiation Oncology | Admitting: Radiation Oncology

## 2015-06-07 ENCOUNTER — Ambulatory Visit: Payer: BLUE CROSS/BLUE SHIELD

## 2015-06-08 ENCOUNTER — Ambulatory Visit
Admission: RE | Admit: 2015-06-08 | Discharge: 2015-06-08 | Disposition: A | Discharge status: Home / Self Care | Payer: BLUE CROSS/BLUE SHIELD | Source: Ambulatory Visit | Attending: Radiation Oncology | Admitting: Radiation Oncology

## 2015-06-08 ENCOUNTER — Ambulatory Visit: Payer: BLUE CROSS/BLUE SHIELD

## 2015-06-08 ENCOUNTER — Encounter: Payer: Self-pay | Admitting: Radiation Oncology

## 2015-06-08 VITALS — BP 130/78 | HR 70 | Temp 98.2°F | Wt 250.4 lb

## 2015-06-08 DIAGNOSIS — C50512 Malignant neoplasm of lower-outer quadrant of left female breast: Secondary | ICD-10-CM

## 2015-06-08 NOTE — Progress Notes (Signed)
Weekly Management Note Current Dose: 45 Gy  Projected Dose: 61 Gy   Narrative:  The patient presents for routine under treatment assessment.  CBCT/MVCT images/Port film x-rays were reviewed.  The chart was checked.  Pt denies pain. Pt reports using radiaplex as directed. She is able to work and sleep with no problems.  Physical Findings: Weight: 250 lb 6.4 oz (113.581 kg). Skin is a little bit more pink. Her left breast is swollen, but not as bad as a few weeks ago.  Impression:  The patient is tolerating radiation.   Plan: Continue radiaplex and will hold off on draining her seroma. Plan to continue radiation. She will start her boost tomorrow.  This document serves as a record of services personally performed by Thea Silversmith, MD. It was created on her behalf by Darcus Austin, a trained medical scribe. The creation of this record is based on the scribe's personal observations and the provider's statements to them. This document has been checked and approved by the attending provider.

## 2015-06-08 NOTE — Progress Notes (Signed)
Weekly assessment of radiation to left breast.Denies pain.Starts boost of 8 tomorrow.Skin is bright pink with no peeling.Continune application of radiaplex twice daily.

## 2015-06-09 ENCOUNTER — Ambulatory Visit
Admission: RE | Admit: 2015-06-09 | Discharge: 2015-06-09 | Disposition: A | Discharge status: Home / Self Care | Payer: BLUE CROSS/BLUE SHIELD | Source: Ambulatory Visit | Attending: Radiation Oncology | Admitting: Radiation Oncology

## 2015-06-09 ENCOUNTER — Ambulatory Visit: Payer: BLUE CROSS/BLUE SHIELD

## 2015-06-09 DIAGNOSIS — C50512 Malignant neoplasm of lower-outer quadrant of left female breast: Secondary | ICD-10-CM | POA: Diagnosis not present

## 2015-06-10 ENCOUNTER — Ambulatory Visit
Admission: RE | Admit: 2015-06-10 | Discharge: 2015-06-10 | Disposition: A | Discharge status: Home / Self Care | Payer: BLUE CROSS/BLUE SHIELD | Source: Ambulatory Visit | Attending: Radiation Oncology | Admitting: Radiation Oncology

## 2015-06-10 ENCOUNTER — Ambulatory Visit: Payer: BLUE CROSS/BLUE SHIELD | Admitting: Hematology and Oncology

## 2015-06-10 DIAGNOSIS — C50512 Malignant neoplasm of lower-outer quadrant of left female breast: Secondary | ICD-10-CM | POA: Diagnosis not present

## 2015-06-11 ENCOUNTER — Ambulatory Visit
Admission: RE | Admit: 2015-06-11 | Discharge: 2015-06-11 | Disposition: A | Discharge status: Home / Self Care | Payer: BLUE CROSS/BLUE SHIELD | Source: Ambulatory Visit | Attending: Radiation Oncology | Admitting: Radiation Oncology

## 2015-06-11 ENCOUNTER — Encounter: Payer: Self-pay | Admitting: Radiation Oncology

## 2015-06-11 DIAGNOSIS — C50512 Malignant neoplasm of lower-outer quadrant of left female breast: Secondary | ICD-10-CM | POA: Diagnosis not present

## 2015-06-14 ENCOUNTER — Ambulatory Visit
Admission: RE | Admit: 2015-06-14 | Discharge: 2015-06-14 | Disposition: A | Discharge status: Home / Self Care | Payer: BLUE CROSS/BLUE SHIELD | Source: Ambulatory Visit | Attending: Radiation Oncology | Admitting: Radiation Oncology

## 2015-06-14 DIAGNOSIS — C50512 Malignant neoplasm of lower-outer quadrant of left female breast: Secondary | ICD-10-CM | POA: Diagnosis not present

## 2015-06-15 ENCOUNTER — Ambulatory Visit
Admission: RE | Admit: 2015-06-15 | Discharge: 2015-06-15 | Disposition: A | Discharge status: Home / Self Care | Payer: BLUE CROSS/BLUE SHIELD | Source: Ambulatory Visit | Attending: Radiation Oncology | Admitting: Radiation Oncology

## 2015-06-15 VITALS — BP 119/70 | HR 71 | Temp 98.2°F | Wt 249.9 lb

## 2015-06-15 DIAGNOSIS — C50512 Malignant neoplasm of lower-outer quadrant of left female breast: Secondary | ICD-10-CM

## 2015-06-15 NOTE — Progress Notes (Signed)
Weekly Management Note Current Dose: 55  Gy  Projected Dose: 61 Gy   Narrative:  The patient presents for routine under treatment assessment.  CBCT/MVCT images/Port film x-rays were reviewed.  The chart was checked. Breast swelling/discomfort stable. Skin is pink.   Physical Findings: Weight: 249 lb 14.4 oz (113.354 kg). Pink left breast.   Impression:  The patient is tolerating radiation.  Plan:  Continue treatment as planned. Continue radiaplex. Make appointment with Dr. [name] for 2-3 weeks post RT for follow up. Has appt with Gudena on Friday. Discussed post RT skin care.

## 2015-06-15 NOTE — Progress Notes (Signed)
Weekly assessment of radiation to left breast.Completed 30 of 33 treatments.Skin is red with out peeling.Follow up in one month.Continue radiaplex twice daily for 2 weeks then change to lotion with vitamin e. BP 119/70 mmHg  Pulse 71  Temp(Src) 98.2 F (36.8 C)  Wt 249 lb 14.4 oz (113.354 kg)

## 2015-06-16 ENCOUNTER — Telehealth: Payer: Self-pay | Admitting: *Deleted

## 2015-06-16 ENCOUNTER — Ambulatory Visit
Admission: RE | Admit: 2015-06-16 | Discharge: 2015-06-16 | Disposition: A | Discharge status: Home / Self Care | Payer: BLUE CROSS/BLUE SHIELD | Source: Ambulatory Visit | Attending: Radiation Oncology | Admitting: Radiation Oncology

## 2015-06-16 DIAGNOSIS — C50512 Malignant neoplasm of lower-outer quadrant of left female breast: Secondary | ICD-10-CM | POA: Diagnosis not present

## 2015-06-16 NOTE — Telephone Encounter (Signed)
Pt called relaying she needed to see Dr. [name] for seroma aspiration and was not able to get into the office until mid September. Called [facility] and was able to get pt in to see Dr. Dalbert Batman in the urgent clinic today at 4:15. Confirmed appt date and time with pt.

## 2015-06-17 ENCOUNTER — Ambulatory Visit: Payer: BLUE CROSS/BLUE SHIELD

## 2015-06-17 ENCOUNTER — Ambulatory Visit
Admission: RE | Admit: 2015-06-17 | Discharge: 2015-06-17 | Disposition: A | Discharge status: Home / Self Care | Payer: BLUE CROSS/BLUE SHIELD | Source: Ambulatory Visit | Attending: Radiation Oncology | Admitting: Radiation Oncology

## 2015-06-17 DIAGNOSIS — C50512 Malignant neoplasm of lower-outer quadrant of left female breast: Secondary | ICD-10-CM | POA: Diagnosis not present

## 2015-06-18 ENCOUNTER — Ambulatory Visit: Payer: BLUE CROSS/BLUE SHIELD

## 2015-06-18 ENCOUNTER — Encounter: Payer: Self-pay | Admitting: Radiation Oncology

## 2015-06-18 ENCOUNTER — Telehealth: Payer: Self-pay | Admitting: Hematology and Oncology

## 2015-06-18 ENCOUNTER — Other Ambulatory Visit: Payer: Self-pay

## 2015-06-18 ENCOUNTER — Ambulatory Visit (HOSPITAL_BASED_OUTPATIENT_CLINIC_OR_DEPARTMENT_OTHER): Payer: BLUE CROSS/BLUE SHIELD | Admitting: Hematology and Oncology

## 2015-06-18 ENCOUNTER — Ambulatory Visit
Admission: RE | Admit: 2015-06-18 | Discharge: 2015-06-18 | Disposition: A | Discharge status: Home / Self Care | Payer: BLUE CROSS/BLUE SHIELD | Source: Ambulatory Visit | Attending: Radiation Oncology | Admitting: Radiation Oncology

## 2015-06-18 ENCOUNTER — Encounter: Payer: Self-pay | Admitting: Hematology and Oncology

## 2015-06-18 ENCOUNTER — Encounter: Payer: Self-pay | Admitting: *Deleted

## 2015-06-18 VITALS — BP 143/57 | HR 76 | Temp 98.7°F | Resp 18 | Ht 70.0 in | Wt 247.5 lb

## 2015-06-18 DIAGNOSIS — C50512 Malignant neoplasm of lower-outer quadrant of left female breast: Secondary | ICD-10-CM | POA: Diagnosis not present

## 2015-06-18 DIAGNOSIS — Z780 Asymptomatic menopausal state: Secondary | ICD-10-CM

## 2015-06-18 MED ORDER — ANASTROZOLE 1 MG PO TABS: 1.0000 mg | ORAL_TABLET | Freq: Every day | ORAL | Status: DC

## 2015-06-18 MED ORDER — VENLAFAXINE HCL ER 37.5 MG PO CP24: 37.5000 mg | ORAL_CAPSULE | Freq: Every day | ORAL | Status: DC

## 2015-06-18 NOTE — Progress Notes (Signed)
Met with pt during f/u appt with Dr. [name] after final xrt. Relate doing well and without complaints. Encourage pt to call with needs. Received verbal understanding.

## 2015-06-18 NOTE — Assessment & Plan Note (Signed)
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

## 2015-06-18 NOTE — Telephone Encounter (Signed)
Gave avs & calednar forSeptember

## 2015-06-18 NOTE — Progress Notes (Signed)
Patient Care Team: Marchia Bond, MD as Consulting Physician (Orthopedic Surgery) Johnathan Hausen, MD as Consulting Physician (General Surgery) Druscilla Brownie, MD as Consulting Physician (Dermatology) Harle Battiest, MD as Consulting Physician (Obstetrics and Gynecology) Barbaraann Cao, OD as Referring Physician (Optometry) Sable Feil, MD as Consulting Physician (Gastroenterology) Alphonsa Overall, MD as Consulting Physician (General Surgery) Nicholas Lose, MD as Consulting Physician (Hematology and Oncology) Thea Silversmith, MD as Consulting Physician (Radiation Oncology) Mauro Kaufmann, RN as Registered Nurse Rockwell Germany, RN as Registered Nurse Holley Bouche, NP as Nurse Practitioner (Nurse Practitioner)  DIAGNOSIS: Breast cancer of lower-outer quadrant of left female breast   Staging form: Breast, AJCC 7th Edition     Clinical stage from 03/17/2015: Stage IA (T1b, N0, M0) - Unsigned       Staging comments: Staged at breast conference on 5.11.16      Pathologic stage from 03/29/2015: Stage IIA (T2, N0, cM0) - Signed by [name], MD on 04/12/2015       Staging comments: Staged on final lumpectomy specimen by 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 She is here today to discuss the treatment after radiation is complete. She reports no major problems tolerating radiation. She does feel mildly fatigued.  REVIEW OF SYSTEMS:   Constitutional: Denies fevers, chills or abnormal weight loss Eyes: Denies blurriness of vision Ears, nose, mouth, throat, and face: Denies mucositis or sore throat Respiratory: Denies cough, dyspnea or wheezes Cardiovascular: Denies palpitation, chest discomfort or lower extremity swelling Gastrointestinal:  Denies nausea, heartburn or change in bowel habits Skin: Denies abnormal skin rashes Lymphatics: Denies new lymphadenopathy or easy bruising Neurological:Denies numbness, tingling or new weaknesses Behavioral/Psych: Mood is stable, no new changes  Breast:  denies any pain or lumps or nodules in either breasts All other systems were reviewed with the patient and are negative.  I have reviewed the past medical history, past surgical history, social history and family history with the patient and they are unchanged from previous note.  ALLERGIES:  is allergic to buprenex.  MEDICATIONS:  Current Outpatient Prescriptions  Medication Sig Dispense Refill  . acetaminophen (TYLENOL) 325 MG tablet Take 650 mg by mouth every 6 (six) hours as needed.    Marland Kitchen anastrozole (ARIMIDEX) 1 MG tablet Take 1 tablet (1 mg total) by mouth daily. 90 tablet 3  . hyaluronate sodium (RADIAPLEXRX) GEL Apply 1 application topically once.    Marland Kitchen HYDROcodone-acetaminophen (NORCO/VICODIN) 5-325 MG per tablet Take by mouth every 6 (six) hours as needed.  0  . venlafaxine XR (EFFEXOR-XR) 37.5 MG 24 hr capsule Take 1 capsule (37.5 mg total) by mouth daily with  breakfast. 30 capsule 6   No current facility-administered medications for this visit.    PHYSICAL  EXAMINATION: ECOG PERFORMANCE STATUS: 1 - Symptomatic but completely ambulatory  Filed Vitals:   06/18/15 0853  BP: 143/57  Pulse: 76  Temp: 98.7 F (37.1 C)  Resp: 18   Filed Weights   06/18/15 0853  Weight: 247 lb 8 oz (112.265 kg)    GENERAL:alert, no distress and comfortable SKIN: skin color, texture, turgor are normal, no rashes or significant lesions EYES: normal, Conjunctiva are pink and non-injected, sclera clear OROPHARYNX:no exudate, no erythema and lips, buccal mucosa, and tongue normal  NECK: supple, thyroid normal size, non-tender, without nodularity LYMPH:  no palpable lymphadenopathy in the cervical, axillary or inguinal LUNGS: clear to auscultation and percussion with normal breathing effort HEART: regular rate & rhythm and no murmurs and no lower extremity edema ABDOMEN:abdomen soft, non-tender and normal bowel sounds Musculoskeletal:no cyanosis of digits and no clubbing  NEURO: alert & oriented x 3 with fluent speech, no focal motor/sensory deficits  LABORATORY DATA:  I have reviewed the data as listed   Chemistry      Component Value Date/Time   NA 142 03/17/2015 0833   NA 139 08/12/2014 1234   K 3.7 03/17/2015 0833   K 4.0 08/12/2014 1234   CL 106 08/12/2014 1234   CO2 24 03/17/2015 0833   CO2 21 08/12/2014 1234   BUN 8.7 03/17/2015 0833   BUN 7 08/12/2014 1234   CREATININE 0.7 03/17/2015 0833   CREATININE 0.5 08/12/2014 1234      Component Value Date/Time   CALCIUM 8.7 03/17/2015 0833   CALCIUM 8.9 08/12/2014 1234   ALKPHOS 65 03/17/2015 0833   ALKPHOS 63 04/16/2014 1113   AST 15 03/17/2015 0833   AST 20 04/16/2014 1113   ALT 17 03/17/2015 0833   ALT 20 04/16/2014 1113   BILITOT 0.46 03/17/2015 0833   BILITOT 0.3 04/16/2014 1113       Lab Results  Component Value Date   WBC 8.0 03/17/2015   HGB 14.9 03/25/2015   HCT 37.0 03/17/2015   MCV 83.0 03/17/2015   PLT 233 03/17/2015   NEUTROABS 5.0 03/17/2015   ASSESSMENT & PLAN:   Breast cancer of lower-outer quadrant of left female breast Left lumpectomy 03/25/2015: Invasive ductal carcinoma, negative for LVID, DCIS with necrosis and calcifications, 0/1 lymph node, grade 2, 2.1 cm, T2 N0 M0 stage II a, Oncotype DX 13; 8% ROR status post adjuvant radiation started 05/01/2015   Recommendation: Adjuvant antiestrogen therapy with anastrozole 1 mg daily since she is postmenopausal We discussed the risks and benefits of anti-estrogen therapy with aromatase inhibitors. These include but not limited to insomnia, hot flashes, mood changes, vaginal dryness, bone density loss, and weight gain. Although rare, serious side effects including endometrial cancer, risk of blood clots were also discussed. We strongly believe that the benefits far outweigh the risks. Patient understands these risks and consented to starting treatment. Planned treatment duration is 5 years.  Return to clinic in 1 month for follow-up to assess tolerability to treatment   Orders Placed This Encounter  Procedures  . DG Bone Density    Standing Status: Future     Number of Occurrences:      Standing Expiration Date: 06/17/2016    Order Specific Question:  Reason for Exam (SYMPTOM  OR DIAGNOSIS REQUIRED)    Answer:  Bone density    Order Specific Question:  Is the patient pregnant?  Answer:  No    Order Specific Question:  Preferred imaging location?    Answer:  [facility]   The patient has a good understanding of the overall plan. she agrees with it. she will call with any problems that may develop before the next visit here.   Rulon Eisenmenger, MD

## 2015-06-18 NOTE — Telephone Encounter (Signed)
Confirmed appointment for Bone Density 08/22

## 2015-06-19 ENCOUNTER — Ambulatory Visit
Admission: RE | Admit: 2015-06-19 | Discharge: 2015-06-19 | Disposition: A | Discharge status: Home / Self Care | Payer: BLUE CROSS/BLUE SHIELD | Source: Ambulatory Visit | Attending: Radiation Oncology | Admitting: Radiation Oncology

## 2015-06-25 ENCOUNTER — Encounter: Payer: Self-pay | Admitting: *Deleted

## 2015-06-25 NOTE — Progress Notes (Signed)
Faxed bone density order to [facility]. Informed pt order placed.

## 2015-06-28 ENCOUNTER — Telehealth: Payer: Self-pay

## 2015-06-28 ENCOUNTER — Ambulatory Visit
Admission: RE | Admit: 2015-06-28 | Discharge: 2015-06-28 | Disposition: A | Discharge status: Home / Self Care | Payer: BLUE CROSS/BLUE SHIELD | Source: Ambulatory Visit | Attending: Hematology and Oncology | Admitting: Hematology and Oncology

## 2015-06-28 DIAGNOSIS — C50512 Malignant neoplasm of lower-outer quadrant of left female breast: Secondary | ICD-10-CM

## 2015-06-28 DIAGNOSIS — Z780 Asymptomatic menopausal state: Secondary | ICD-10-CM

## 2015-06-28 NOTE — Telephone Encounter (Signed)
Order for [facility].  Sent to scan.

## 2015-06-30 NOTE — Progress Notes (Signed)
  Radiation Oncology         [phone] ________________________________  Name: Wanda Moore MRN: [id number]  Date: 06/19/2015  DOB: 06/08/1961  End of Treatment Note  Diagnosis:   Breast cancer of lower-outer quadrant of left female breast   Staging form: Breast, AJCC 7th Edition     Clinical stage from 03/17/2015: Stage IA (T1b, N0, M0) - Unsigned       Staging comments: Staged at breast conference on 5.11.16      Pathologic stage from 03/29/2015: Stage IIA (T2, N0, cM0) - Signed by [name], MD on 04/12/2015       Staging comments: Staged on final lumpectomy specimen by [name]  Indication for treatment:  Curative       Radiation treatment dates:   04/2715 - 06/08/15  Site/dose:  Left breast,1.8 Gy/fx for 25 fractions for a total 45 Gy.  Left Breast Boost, 2 Gy/fx for 8 fractions for a total 16 Gy  Beams/energy: 3D-Breath Hold, 6 and 10 MV photons  Narrative: The patient tolerated radiation treatment relatively well.     Plan: The patient has completed radiation treatment. The patient will return to radiation oncology clinic for routine followup in one month. I advised them to call or return sooner if they have any questions or concerns related to their recovery or treatment.  ------------------------------------------------  Thea Silversmith, MD  This document serves as a record of services personally performed by Thea Silversmith, MD. It was created on her behalf by Derek Mound, a trained medical scribe. The creation of this record is based on the scribe's personal observations and the provider's statements to them. This document has been checked and approved by the attending provider.

## 2015-07-01 ENCOUNTER — Other Ambulatory Visit: Payer: Self-pay | Admitting: Adult Health

## 2015-07-01 DIAGNOSIS — C50512 Malignant neoplasm of lower-outer quadrant of left female breast: Secondary | ICD-10-CM

## 2015-07-06 ENCOUNTER — Encounter: Payer: Self-pay | Admitting: *Deleted

## 2015-07-06 NOTE — Progress Notes (Signed)
Received bone density report from [facility], sent to scan. 

## 2015-07-15 ENCOUNTER — Ambulatory Visit (INDEPENDENT_AMBULATORY_CARE_PROVIDER_SITE_OTHER): Payer: BLUE CROSS/BLUE SHIELD | Admitting: Family

## 2015-07-15 ENCOUNTER — Encounter: Payer: Self-pay | Admitting: Family

## 2015-07-15 VITALS — BP 108/78 | HR 67 | Temp 98.2°F | Resp 18 | Ht 70.0 in | Wt 249.8 lb

## 2015-07-15 DIAGNOSIS — Z23 Encounter for immunization: Secondary | ICD-10-CM | POA: Diagnosis not present

## 2015-07-15 DIAGNOSIS — Z0000 Encounter for general adult medical examination without abnormal findings: Secondary | ICD-10-CM | POA: Diagnosis not present

## 2015-07-15 NOTE — Addendum Note (Signed)
Encounter addended by: Thea Silversmith, MD on: 07/15/2015  2:25 PM<BR>     Documentation filed: Notes Section

## 2015-07-15 NOTE — Assessment & Plan Note (Signed)
1) Anticipatory Guidance: Discussed importance of wearing a seatbelt while driving and not texting while driving; changing batteries in smoke detector at least once annually; wearing suntan lotion when outside; eating a balanced and moderate diet; getting physical activity at least 30 minutes per day.  2) Immunizations / Screenings / Labs:  Influenza updated today. All other immunizations are up-to-date per recommendations. For a dental screening which will be scheduled independently. Declines hepatitis C and HIV screening at this time. All other screenings are up-to-date per recommendations. Obtain CBC, CMET, Lipid profile and TSH when fasting.   Overall well exam. Risk factors for cardiovascular disease include obesity and sedentary lifestyle. Recommend a goal of 5-10% of current body weight loss through improved nutrition and nutrient density combined with increased physical activity to 30 minutes most days of the week. Currently undergoing treatment for breast cancer and doing very well. Her mood is currently managed on venlafaxine. Continue healthy lifestyle behaviors. Follow-up office visit pending lab work and follow-up prevention exam in one year.

## 2015-07-15 NOTE — Progress Notes (Signed)
Name: Wanda Moore   MRN: [id number]  Date:  05/12/15   DOB: 06/17/1961  Status:outpatient    DIAGNOSIS: Breast cancer of lower-outer quadrant of left female breast   Staging form: Breast, AJCC 7th Edition     Clinical stage from 03/17/2015: Stage IA (T1b, N0, M0) - Unsigned       Staging comments: Staged at breast conference on 5.11.16      Pathologic stage from 03/29/2015: Stage IIA (T2, N0, cM0) - Signed by [name], MD on 04/12/2015       Staging comments: Staged on final lumpectomy specimen by [name]   CONSENT VERIFIED: yes   SET UP: Patient is setup supine   IMMOBILIZATION:  The following immobilization was used:Custom Moldable Pillow, breast board.   NARRATIVE: [name] underwent complex simulation and treatment planning for her boost treatment today.  Her tumor volume was outlined on the planning CT scan.  Due to the depth of her cavity, electrons could not be used and a photon plan was developed. The plan will be prescribed to the  isodose line.   I personally supervised and approved the creation of 3 unique MLCs comprising 3   treatment devices.

## 2015-07-15 NOTE — Progress Notes (Signed)
Subjective:    Patient ID: Wanda Moore, female    DOB: 24-Mar-1961, 54 y.o.   MRN: [id number]  Chief Complaint  Patient presents with  . CPE    Not fasting, flu shot    HPI:  Wanda Moore is a 54 y.o. female who presents today for an annual wellness visit.   1) Health Maintenance -   Diet - Averages about 3 meals per day and consists of cereal, juice, vegetables, fruit, chicken, pasta, beef, dairy; 3-4 cups of caffeine per day  Exercise - Walking on an irregular basis   2) Preventative Exams / Immunizations:  Dental -- Due for exam  Vision -- Up to date   Health Maintenance  Topic Date Due  . Hepatitis C Screening  Aug 09, 1961  . HIV Screening  06/08/1975  . INFLUENZA VACCINE  06/07/2015  . MAMMOGRAM  03/10/2017  . COLONOSCOPY  03/24/2017  . PAP SMEAR  11/11/2017  . TETANUS/TDAP  08/17/2020    Immunization History  Administered Date(s) Administered  . Influenza Split 08/10/2012    Allergies  Allergen Reactions  . Buprenex [Buprenorphine] Itching     Outpatient Prescriptions Prior to Visit  Medication Sig Dispense Refill  . acetaminophen (TYLENOL) 325 MG tablet Take 650 mg by mouth every 6 (six) hours as needed.    Marland Kitchen anastrozole (ARIMIDEX) 1 MG tablet Take 1 tablet (1 mg total) by mouth daily. 90 tablet 3  . venlafaxine XR (EFFEXOR-XR) 37.5 MG 24 hr capsule Take 1 capsule (37.5 mg total) by mouth daily with breakfast. 30 capsule 6  . hyaluronate sodium (RADIAPLEXRX) GEL Apply 1 application topically once.    Marland Kitchen HYDROcodone-acetaminophen (NORCO/VICODIN) 5-325 MG per tablet Take by mouth every 6 (six) hours as needed.  0   No facility-administered medications prior to visit.     Past Medical History  Diagnosis Date  . Esophageal reflux   . Diverticulosis of colon (without mention of hemorrhage)   . Recurrent UTI   . Barrett esophagus 2008  . Hiatal hernia 2013    large  . Stricture and stenosis of esophagus 2013  . Cough 08-03-2012    nonproductive  . Melanoma     removed from betwen shoulder blades  . Distal radius fracture, left 12/05/2012  . Allergy   . Breast cancer of lower-outer quadrant of left female breast 03/15/2015  . Breast cancer   . Anxiety      Past Surgical History  Procedure Laterality Date  . Cesarean section  1992  . Melanoma excision      back  . Cholecystectomy    . Laparoscopic nissen fundoplication  73/02/1936    Procedure: LAPAROSCOPIC NISSEN FUNDOPLICATION;  Surgeon: Pedro Earls, MD;  Location: [facility];  Service: General;  Laterality: N/A;  HIATAL HERNIA REPAIR  . Open reduction internal fixation (orif) distal radial fracture  12/05/2012    Procedure: OPEN REDUCTION INTERNAL FIXATION (ORIF) DISTAL RADIAL FRACTURE;  Surgeon: Johnny Bridge, MD;  Location: [facility];  Service: Orthopedics;  Laterality: Left;  . Colonoscopy  03/25/2007    Diverticulosis   . Esophagogastroduodenoscopy  04/10/2012    HH, and Stricture  . Breast lumpectomy with radioactive seed and sentinel lymph node biopsy Left 03/25/2015    Procedure: LEFT BREAST LUMPECTOMY WITH RADIOACTIVE SEED AND LEFT AXILLARY SENTINEL LYMPH NODE BIOPSY;  Surgeon: Alphonsa Overall, MD;  Location: [place];  Service: General;  Laterality: Left;     Family History  Problem Relation Age  of Onset  . Breast cancer Sister   . Cancer Sister 53    breast (half sister - same mother)  . Hypertension Mother   . Diabetes Mother   . Heart disease Mother   . Lung cancer Father   . Other Father     father was adopted - no paternal family history information  . Colon cancer Neg Hx   . Diabetes Maternal Grandmother      Social History   Social History  . Marital Status: Married    Spouse Name: N/A  . Number of Children: 2  . Years of Education: 14   Occupational History  . adm assistance [employer]   Social History Main Topics  . Smoking status: Never Smoker   . Smokeless tobacco: Never Used    . Alcohol Use: 0.0 oz/week    0 Standard drinks or equivalent per week     Comment: socially  . Drug Use: No  . Sexual Activity: Yes   Other Topics Concern  . Not on file   Social History Narrative   HSG, Reader - 2 years. Married 1990. 2 dtrs - '92, '94. Marriage in good health.   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   Feels safe at home and denies abuse                Review of Systems  Constitutional: Denies fever, chills, fatigue, or significant weight gain/loss. HENT: Head: Denies headache or neck pain Ears: Denies changes in hearing, ringing in ears, earache, drainage Nose: Denies discharge, stuffiness, itching, nosebleed, sinus pain Throat: Denies sore throat, hoarseness, dry mouth, sores, thrush Eyes: Denies loss/changes in vision, pain, redness, blurry/double vision, flashing lights Cardiovascular: Denies chest pain/discomfort, tightness, palpitations, shortness of breath with activity, difficulty lying down, swelling, sudden awakening with shortness of breath Respiratory: Denies shortness of breath, cough, sputum production, wheezing Gastrointestinal: Denies dysphasia, heartburn, change in appetite, nausea, change in bowel habits, rectal bleeding, constipation, diarrhea, yellow skin or eyes Genitourinary: Denies frequency, urgency, burning/pain, blood in urine, incontinence, change in urinary strength. Musculoskeletal: Denies muscle/joint pain, stiffness, back pain, redness or swelling of joints, trauma Skin: Denies rashes, lumps, itching, dryness, color changes, or hair/nail changes Neurological: Denies dizziness, fainting, seizures, weakness, numbness, tingling, tremor Psychiatric - Denies nervousness, stress, depression or memory loss Endocrine: Denies heat or cold intolerance, sweating, frequent urination, excessive thirst, changes in appetite Hematologic: Denies ease of  bruising or bleeding     Objective:    BP 108/78 mmHg  Pulse 67  Temp(Src) 98.2 F (36.8 C) (Oral)  Resp 18  Ht 5\' 10"  (1.778 m)  Wt 249 lb 12.8 oz (113.309 kg)  BMI 35.84 kg/m2  SpO2 94% Nursing note and vital signs reviewed.  Physical Exam  Constitutional: She is oriented to person, place, and time. She appears well-developed and well-nourished.  HENT:  Head: Normocephalic.  Right Ear: Hearing, tympanic membrane, external ear and ear canal normal.  Left Ear: Hearing, tympanic membrane, external ear and ear canal normal.  Nose: Nose normal.  Mouth/Throat: Uvula is midline, oropharynx is clear and moist and mucous membranes are normal.  Eyes: Conjunctivae and EOM are normal. Pupils are equal, round, and reactive to light.  Neck: Neck supple. No JVD present. No tracheal deviation present. No thyromegaly present.  Cardiovascular: Normal rate, regular rhythm, normal heart sounds and intact distal pulses.   Pulmonary/Chest: Effort normal  and breath sounds normal.  Abdominal: Soft. Bowel sounds are normal. She exhibits no distension and no mass. There is no tenderness. There is no rebound and no guarding.  Musculoskeletal: Normal range of motion. She exhibits no edema or tenderness.  Lymphadenopathy:    She has no cervical adenopathy.  Neurological: She is alert and oriented to person, place, and time. She has normal reflexes. No cranial nerve deficit. She exhibits normal muscle tone. Coordination normal.  Skin: Skin is warm and dry.  Psychiatric: She has a normal mood and affect. Her behavior is normal. Judgment and thought content normal.       Assessment & Plan:   Problem List Items Addressed This Visit      Other   Routine health maintenance - Primary    1) Anticipatory Guidance: Discussed importance of wearing a seatbelt while driving and not texting while driving; changing batteries in smoke detector at least once annually; wearing suntan lotion when outside; eating a  balanced and moderate diet; getting physical activity at least 30 minutes per day.  2) Immunizations / Screenings / Labs:  Influenza updated today. All other immunizations are up-to-date per recommendations. For a dental screening which will be scheduled independently. Declines hepatitis C and HIV screening at this time. All other screenings are up-to-date per recommendations. Obtain CBC, CMET, Lipid profile and TSH when fasting.   Overall well exam. Risk factors for cardiovascular disease include obesity and sedentary lifestyle. Recommend a goal of 5-10% of current body weight loss through improved nutrition and nutrient density combined with increased physical activity to 30 minutes most days of the week. Currently undergoing treatment for breast cancer and doing very well. Her mood is currently managed on venlafaxine. Continue healthy lifestyle behaviors. Follow-up office visit pending lab work and follow-up prevention exam in one year.      Relevant Orders   Comprehensive metabolic panel   CBC   Lipid panel   TSH

## 2015-07-15 NOTE — Progress Notes (Signed)
Pre visit review using our clinic review tool, if applicable. No additional management support is needed unless otherwise documented below in the visit note. 

## 2015-07-15 NOTE — Patient Instructions (Signed)
Thank you for choosing [facility].  Summary/Instructions:  Please stop by the lab on the basement level of the building for your blood work. Your results will be released to MyChart (or called to you) after review, usually within 72hours after test completion. If any changes need to be made, you will be notified at that same time.  Health Maintenance Adopting a healthy lifestyle and getting preventive care can go a long way to promote health and wellness. Talk with your health care provider about what schedule of regular examinations is right for you. This is a good chance for you to check in with your provider about disease prevention and staying healthy. In between checkups, there are plenty of things you can do on your own. Experts have done a lot of research about which lifestyle changes and preventive measures are most likely to keep you healthy. Ask your health care provider for more information. WEIGHT AND DIET  Eat a healthy diet  Be sure to include plenty of vegetables, fruits, low-fat dairy products, and lean protein.  Do not eat a lot of foods high in solid fats, added sugars, or salt.  Get regular exercise. This is one of the most important things you can do for your health.  Most adults should exercise for at least 150 minutes each week. The exercise should increase your heart rate and make you sweat (moderate-intensity exercise).  Most adults should also do strengthening exercises at least twice a week. This is in addition to the moderate-intensity exercise.  Maintain a healthy weight  Body mass index (BMI) is a measurement that can be used to identify possible weight problems. It estimates body fat based on height and weight. Your health care provider can help determine your BMI and help you achieve or maintain a healthy weight.  For females 20 years of age and older:   A BMI below 18.5 is considered underweight.  A BMI of 18.5 to 24.9 is normal.  A BMI of 25  to 29.9 is considered overweight.  A BMI of 30 and above is considered obese.  Watch levels of cholesterol and blood lipids  You should start having your blood tested for lipids and cholesterol at 55 years of age, then have this test every 5 years.  You may need to have your cholesterol levels checked more often if:  Your lipid or cholesterol levels are high.  You are older than 55 years of age.  You are at high risk for heart disease.  CANCER SCREENING   Lung Cancer  Lung cancer screening is recommended for adults 55-80 years old who are at high risk for lung cancer because of a history of smoking.  A yearly low-dose CT scan of the lungs is recommended for people who:  Currently smoke.  Have quit within the past 15 years.  Have at least a 30-pack-year history of smoking. A pack year is smoking an average of one pack of cigarettes a day for 1 year.  Yearly screening should continue until it has been 15 years since you quit.  Yearly screening should stop if you develop a health problem that would prevent you from having lung cancer treatment.  Breast Cancer  Practice breast self-awareness. This means understanding how your breasts normally appear and feel.  It also means doing regular breast self-exams. Let your health care provider know about any changes, no matter how small.  If you are in your 20s or 30s, you should have a clinical breast exam (  exam (CBE) by a health care provider every 1-3 years as part of a regular health exam.  If you are 61 or older, have a CBE every year. Also consider having a breast X-ray (mammogram) every year.  If you have a family history of breast cancer, talk to your health care provider about genetic screening.  If you are at high risk for breast cancer, talk to your health care provider about having an MRI and a mammogram every year.  Breast cancer gene (BRCA) assessment is recommended for women who have family members with BRCA-related  cancers. BRCA-related cancers include:  Breast.  Ovarian.  Tubal.  Peritoneal cancers.  Results of the assessment will determine the need for genetic counseling and BRCA1 and BRCA2 testing. Cervical Cancer Routine pelvic examinations to screen for cervical cancer are no longer recommended for nonpregnant women who are considered low risk for cancer of the pelvic organs (ovaries, uterus, and vagina) and who do not have symptoms. A pelvic examination may be necessary if you have symptoms including those associated with pelvic infections. Ask your health care provider if a screening pelvic exam is right for you.   The Pap test is the screening test for cervical cancer for women who are considered at risk.  If you had a hysterectomy for a problem that was not cancer or a condition that could lead to cancer, then you no longer need Pap tests.  If you are older than 65 years, and you have had normal Pap tests for the past 10 years, you no longer need to have Pap tests.  If you have had past treatment for cervical cancer or a condition that could lead to cancer, you need Pap tests and screening for cancer for at least 20 years after your treatment.  If you no longer get a Pap test, assess your risk factors if they change (such as having a new sexual partner). This can affect whether you should start being screened again.  Some women have medical problems that increase their chance of getting cervical cancer. If this is the case for you, your health care provider may recommend more frequent screening and Pap tests.  The human papillomavirus (HPV) test is another test that may be used for cervical cancer screening. The HPV test looks for the virus that can cause cell changes in the cervix. The cells collected during the Pap test can be tested for HPV.  The HPV test can be used to screen women 64 years of age and older. Getting tested for HPV can extend the interval between normal Pap tests from  three to five years.  An HPV test also should be used to screen women of any age who have unclear Pap test results.  After 55 years of age, women should have HPV testing as often as Pap tests.  Colorectal Cancer  This type of cancer can be detected and often prevented.  Routine colorectal cancer screening usually begins at 55 years of age and continues through 55 years of age.  Your health care provider may recommend screening at an earlier age if you have risk factors for colon cancer.  Your health care provider may also recommend using home test kits to check for hidden blood in the stool.  A small camera at the end of a tube can be used to examine your colon directly (sigmoidoscopy or colonoscopy). This is done to check for the earliest forms of colorectal cancer.  Routine screening usually begins at age 67.  Direct examination of the colon should be repeated every 5-10 years through 55 years of age. However, you may need to be screened more often if early forms of precancerous polyps or small growths are found. Skin Cancer  Check your skin from head to toe regularly.  Tell your health care provider about any new moles or changes in moles, especially if there is a change in a mole's shape or color.  Also tell your health care provider if you have a mole that is larger than the size of a pencil eraser.  Always use sunscreen. Apply sunscreen liberally and repeatedly throughout the day.  Protect yourself by wearing long sleeves, pants, a wide-brimmed hat, and sunglasses whenever you are outside. HEART DISEASE, DIABETES, AND HIGH BLOOD PRESSURE   Have your blood pressure checked at least every 1-2 years. High blood pressure causes heart disease and increases the risk of stroke.  If you are between 55 years and 79 years old, ask your health care provider if you should take aspirin to prevent strokes.  Have regular diabetes screenings. This involves taking a blood sample to check  your fasting blood sugar level.  If you are at a normal weight and have a low risk for diabetes, have this test once every three years after 55 years of age.  If you are overweight and have a high risk for diabetes, consider being tested at a younger age or more often. PREVENTING INFECTION  Hepatitis B  If you have a higher risk for hepatitis B, you should be screened for this virus. You are considered at high risk for hepatitis B if:  You were born in a country where hepatitis B is common. Ask your health care provider which countries are considered high risk.  Your parents were born in a high-risk country, and you have not been immunized against hepatitis B (hepatitis B vaccine).  You have HIV or AIDS.  You use needles to inject street drugs.  You live with someone who has hepatitis B.  You have had sex with someone who has hepatitis B.  You get hemodialysis treatment.  You take certain medicines for conditions, including cancer, organ transplantation, and autoimmune conditions. Hepatitis C  Blood testing is recommended for:  Everyone born from 1945 through 1965.  Anyone with known risk factors for hepatitis C. Sexually transmitted infections (STIs)  You should be screened for sexually transmitted infections (STIs) including gonorrhea and chlamydia if:  You are sexually active and are younger than 55 years of age.  You are older than 55 years of age and your health care provider tells you that you are at risk for this type of infection.  Your sexual activity has changed since you were last screened and you are at an increased risk for chlamydia or gonorrhea. Ask your health care provider if you are at risk.  If you do not have HIV, but are at risk, it may be recommended that you take a prescription medicine daily to prevent HIV infection. This is called pre-exposure prophylaxis (PrEP). You are considered at risk if:  You are sexually active and do not regularly use  condoms or know the HIV status of your partner(s).  You take drugs by injection.  You are sexually active with a partner who has HIV. Talk with your health care provider about whether you are at high risk of being infected with HIV. If you choose to begin PrEP, you should first be tested for HIV. You should then be   tested every 3 months for as long as you are taking PrEP.  PREGNANCY   If you are premenopausal and you may become pregnant, ask your health care provider about preconception counseling.  If you may become pregnant, take 400 to 800 micrograms (mcg) of folic acid every day.  If you want to prevent pregnancy, talk to your health care provider about birth control (contraception). OSTEOPOROSIS AND MENOPAUSE   Osteoporosis is a disease in which the bones lose minerals and strength with aging. This can result in serious bone fractures. Your risk for osteoporosis can be identified using a bone density scan.  If you are 65 years of age or older, or if you are at risk for osteoporosis and fractures, ask your health care provider if you should be screened.  Ask your health care provider whether you should take a calcium or vitamin D supplement to lower your risk for osteoporosis.  Menopause may have certain physical symptoms and risks.  Hormone replacement therapy may reduce some of these symptoms and risks. Talk to your health care provider about whether hormone replacement therapy is right for you.  HOME CARE INSTRUCTIONS   Schedule regular health, dental, and eye exams.  Stay current with your immunizations.   Do not use any tobacco products including cigarettes, chewing tobacco, or electronic cigarettes.  If you are pregnant, do not drink alcohol.  If you are breastfeeding, limit how much and how often you drink alcohol.  Limit alcohol intake to no more than 1 drink per day for nonpregnant women. One drink equals 12 ounces of beer, 5 ounces of wine, or 1 ounces of hard  liquor.  Do not use street drugs.  Do not share needles.  Ask your health care provider for help if you need support or information about quitting drugs.  Tell your health care provider if you often feel depressed.  Tell your health care provider if you have ever been abused or do not feel safe at home. Document Released: 05/08/2011 Document Revised: 03/09/2014 Document Reviewed: 09/24/2013 Exit[employer] Patient Information 2015 [employer]. This information is not intended to replace advice given to you by your health care provider. Make sure you discuss any questions you have with your health care provider.  

## 2015-07-29 ENCOUNTER — Ambulatory Visit: Payer: BLUE CROSS/BLUE SHIELD | Admitting: Radiation Oncology

## 2015-07-29 NOTE — Assessment & Plan Note (Signed)
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 Breast exams every 4-6 months 2. Mammograms annually  Return to clinic in 6 months for follow-up

## 2015-07-30 ENCOUNTER — Other Ambulatory Visit (INDEPENDENT_AMBULATORY_CARE_PROVIDER_SITE_OTHER): Payer: BLUE CROSS/BLUE SHIELD

## 2015-07-30 ENCOUNTER — Ambulatory Visit (HOSPITAL_BASED_OUTPATIENT_CLINIC_OR_DEPARTMENT_OTHER): Payer: BLUE CROSS/BLUE SHIELD | Admitting: Hematology and Oncology

## 2015-07-30 ENCOUNTER — Encounter: Payer: Self-pay | Admitting: Hematology and Oncology

## 2015-07-30 ENCOUNTER — Telehealth: Payer: Self-pay | Admitting: Hematology and Oncology

## 2015-07-30 ENCOUNTER — Ambulatory Visit
Admission: RE | Admit: 2015-07-30 | Discharge: 2015-07-30 | Disposition: A | Discharge status: Home / Self Care | Payer: BLUE CROSS/BLUE SHIELD | Source: Ambulatory Visit | Attending: Radiation Oncology | Admitting: Radiation Oncology

## 2015-07-30 VITALS — BP 121/54 | HR 69 | Temp 98.6°F | Resp 18 | Ht 70.0 in | Wt 247.0 lb

## 2015-07-30 VITALS — BP 146/84 | HR 79 | Temp 98.2°F | Wt 249.3 lb

## 2015-07-30 DIAGNOSIS — C50512 Malignant neoplasm of lower-outer quadrant of left female breast: Secondary | ICD-10-CM

## 2015-07-30 DIAGNOSIS — R7401 Elevation of levels of liver transaminase levels: Secondary | ICD-10-CM

## 2015-07-30 DIAGNOSIS — Z0000 Encounter for general adult medical examination without abnormal findings: Secondary | ICD-10-CM | POA: Diagnosis not present

## 2015-07-30 DIAGNOSIS — R740 Nonspecific elevation of levels of transaminase and lactic acid dehydrogenase [LDH]: Principal | ICD-10-CM

## 2015-07-30 LAB — COMPREHENSIVE METABOLIC PANEL
ALT: 45 U/L — ABNORMAL HIGH (ref 0–35)
AST: 28 U/L (ref 0–37)
Albumin: 4.1 g/dL (ref 3.5–5.2)
Alkaline Phosphatase: 66 U/L (ref 39–117)
BUN: 13 mg/dL (ref 6–23)
CO2: 26 mEq/L (ref 19–32)
Calcium: 9.6 mg/dL (ref 8.4–10.5)
Chloride: 105 mEq/L (ref 96–112)
Creatinine, Ser: 0.6 mg/dL (ref 0.40–1.20)
GFR: 110.26 mL/min (ref >60.00)
Glucose, Bld: 95 mg/dL (ref 70–99)
Potassium: 4 mEq/L (ref 3.5–5.1)
Sodium: 141 mEq/L (ref 135–145)
Total Bilirubin: 0.5 mg/dL (ref 0.2–1.2)
Total Protein: 7.4 g/dL (ref 6.0–8.3)

## 2015-07-30 LAB — CBC
HCT: 37.5 % (ref 36.0–46.0)
Hemoglobin: 12.7 g/dL (ref 12.0–15.0)
MCHC: 33.8 g/dL (ref 30.0–36.0)
MCV: 85 fl (ref 78.0–100.0)
Platelets: 240 10*3/uL (ref 150.0–400.0)
RBC: 4.41 Mil/uL (ref 3.87–5.11)
RDW: 13.1 % (ref 11.5–15.5)
WBC: 6.1 10*3/uL (ref 4.0–10.5)

## 2015-07-30 LAB — LIPID PANEL
Cholesterol: 199 mg/dL (ref 0–200)
HDL: 42.9 mg/dL (ref >39.00)
LDL Cholesterol: 129 mg/dL — ABNORMAL HIGH (ref 0–99)
NonHDL: 156.05
Total CHOL/HDL Ratio: 5
Triglycerides: 135 mg/dL (ref 0.0–149.0)
VLDL: 27 mg/dL (ref 0.0–40.0)

## 2015-07-30 LAB — TSH: TSH: 1.57 u[IU]/mL (ref 0.35–4.50)

## 2015-07-30 NOTE — Progress Notes (Signed)
Patient Care Team: [name], FNP as PCP - General (Family Medicine) Marchia Bond, MD as Consulting Physician (Orthopedic Surgery) Johnathan Hausen, MD as Consulting Physician (General Surgery) Druscilla Brownie, MD as Consulting Physician (Dermatology) Harle Battiest, MD as Consulting Physician (Obstetrics and Gynecology) Barbaraann Cao, OD as Referring Physician (Optometry) Sable Feil, MD as Consulting Physician (Gastroenterology) Alphonsa Overall, MD as Consulting Physician (General Surgery) Nicholas Lose, MD as Consulting Physician (Hematology and Oncology) Thea Silversmith, MD as Consulting Physician (Radiation Oncology) Mauro Kaufmann, RN as Registered Nurse Rockwell Germany, RN as Registered Nurse Holley Bouche, NP as Nurse Practitioner (Nurse Practitioner)  DIAGNOSIS: Breast cancer of lower-outer quadrant of left female breast   Staging form: Breast, AJCC 7th Edition     Clinical stage from 03/17/2015: Stage IA (T1b, N0, M0) - Unsigned       Staging comments: Staged at breast conference on 5.11.16      Pathologic stage from 03/29/2015: Stage IIA (T2, N0, cM0) - Signed by [name], MD on 04/12/2015       Staging comments: Staged on final lumpectomy specimen by 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 Her major complaint is hot flashes which have gotten much worse than before. She is now having hot flashes accompanied by profound sweats. This happens especially when she exercises or stays outdoors in the sun. She is currently taking Effexor for these hot flashes. It seems to help her somewhat.  REVIEW OF SYSTEMS:   Constitutional: Denies fevers, chills or abnormal weight loss Eyes: Denies blurriness of vision Ears, nose, mouth, throat, and face: Denies mucositis or sore throat Respiratory: Denies cough, dyspnea or wheezes Cardiovascular: Denies palpitation, chest discomfort or lower extremity swelling Gastrointestinal:  Denies nausea, heartburn or change in bowel habits Skin: Denies abnormal skin rashes Lymphatics: Denies new lymphadenopathy or easy bruising Neurological:Denies numbness, tingling or new weaknesses Behavioral/Psych: Mood is stable, no new changes  Breast:  denies any pain or lumps or nodules in either breasts All other systems were reviewed with the patient and are negative.  I have reviewed the past medical history, past surgical history, social history and family history with the patient and they are unchanged from previous note.  ALLERGIES:  is allergic to buprenex.  MEDICATIONS:  Current Outpatient Prescriptions  Medication Sig Dispense Refill  . acetaminophen (TYLENOL) 325 MG tablet Take 650 mg by mouth every 6 (six) hours as needed.    Marland Kitchen anastrozole (ARIMIDEX) 1 MG tablet Take 1 tablet (1 mg total) by mouth daily. 90 tablet 3  .  Calcium Carbonate-Vitamin D (CALCIUM-VITAMIN D) 500-200 MG-UNIT per tablet Take 1 tablet by mouth 2  (two) times daily.    Marland Kitchen venlafaxine XR (EFFEXOR-XR) 37.5 MG 24 hr capsule Take 1 capsule (37.5 mg total) by mouth daily with breakfast. 30 capsule 6   No current facility-administered medications for this visit.    PHYSICAL EXAMINATION: ECOG PERFORMANCE STATUS: 1 - Symptomatic but completely ambulatory  Filed Vitals:   07/30/15 0811  BP: 121/54  Pulse: 69  Temp: 98.6 F (37 C)  Resp: 18   Filed Weights   07/30/15 0811  Weight: 247 lb (112.038 kg)    GENERAL:alert, no distress and comfortable SKIN: skin color, texture, turgor are normal, no rashes or significant lesions EYES: normal, Conjunctiva are pink and non-injected, sclera clear OROPHARYNX:no exudate, no erythema and lips, buccal mucosa, and tongue normal  NECK: supple, thyroid normal size, non-tender, without nodularity LYMPH:  no palpable lymphadenopathy in the cervical, axillary or inguinal LUNGS: clear to auscultation and percussion with normal breathing effort HEART: regular rate & rhythm and no murmurs and no lower extremity edema ABDOMEN:abdomen soft, non-tender and normal bowel sounds Musculoskeletal:no cyanosis of digits and no clubbing  NEURO: alert & oriented x 3 with fluent speech, no focal motor/sensory deficits  LABORATORY DATA:  I have reviewed the data as listed   Chemistry      Component Value Date/Time   NA 142 03/17/2015 0833   NA 139 08/12/2014 1234   K 3.7 03/17/2015 0833   K 4.0 08/12/2014 1234   CL 106 08/12/2014 1234   CO2 24 03/17/2015 0833   CO2 21 08/12/2014 1234   BUN 8.7 03/17/2015 0833   BUN 7 08/12/2014 1234   CREATININE 0.7 03/17/2015 0833   CREATININE 0.5 08/12/2014 1234      Component Value Date/Time   CALCIUM 8.7 03/17/2015 0833   CALCIUM 8.9 08/12/2014 1234   ALKPHOS 65 03/17/2015 0833   ALKPHOS 63 04/16/2014 1113   AST 15 03/17/2015 0833   AST 20 04/16/2014 1113   ALT 17 03/17/2015 0833   ALT 20 04/16/2014 1113   BILITOT 0.46 03/17/2015 0833   BILITOT 0.3  04/16/2014 1113       Lab Results  Component Value Date   WBC 8.0 03/17/2015   HGB 14.9 03/25/2015   HCT 37.0 03/17/2015   MCV 83.0 03/17/2015   PLT 233 03/17/2015   NEUTROABS 5.0 03/17/2015   ASSESSMENT & PLAN:  Breast cancer of lower-outer quadrant of left female breast Left lumpectomy 03/25/2015: Invasive ductal carcinoma, 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 Hot flashes: Patient has had previous hot flashes and was taking Effexor. It appears that the hot flashes have gotten much worse. They're not accompanied by profound sweating especially when she works out or spends outdoors in the sun. We discussed about increasing the dosage of Effexor. But she would like to wait and watch for a few more months to see for her body gets adjusted.  Breast cancer surveillance: 1. Breast exams every 4-6 months 2. Mammograms annually  Return to clinic in 3 months for follow-up.   No orders of the defined types were placed in this encounter.   The patient has a good understanding of the overall plan. she agrees with it.  she will call with any problems that may develop before the next visit here.   Rulon Eisenmenger, MD

## 2015-07-30 NOTE — Addendum Note (Signed)
Addended by: Prentiss Bells on: 07/30/2015 08:52 AM   Modules accepted: Medications

## 2015-07-30 NOTE — Telephone Encounter (Signed)
Appointments made and avs printed for patient °

## 2015-07-30 NOTE — Progress Notes (Addendum)
   Department of Radiation Oncology  Phone:  [phone] Fax:        [phone]   Name: Wanda Moore MRN: [id number]  DOB: 04-18-1961  Date: 07/30/2015  Follow Up Visit Note  Diagnosis: Breast cancer of lower-outer quadrant of left female breast   Staging form: Breast, AJCC 7th Edition     Clinical stage from 03/17/2015: Stage IA (T1b, N0, M0) - Unsigned       Staging comments: Staged at breast conference on 5.11.16      Pathologic stage from 03/29/2015: Stage IIA (T2, N0, cM0) - Signed by [name], MD on 04/12/2015       Staging comments: Staged on final lumpectomy specimen by [name]  Summary and Interval since last radiation: Left breast,1.8 Gy/fx for 25 fractions for a total 45 Gy.  Left Breast Boost, 2 Gy/fx for 8 fractions for a total 16 Gy  Interval History: Wanda Moore presents today for routine followup.  She is doing well. She has had some swelling this weekend into her tumor cavity again and some minor discomfort. Nothing like she had before and she is not ready for drainage again yet. She is having hot flashes from her AI but tolerating that well otherwise. She has a survivorship visit coming up.   Physical Exam:  Filed Vitals:   07/30/15 1643  BP: 146/84  Pulse: 79  Temp: 98.2 F (36.8 C)  Weight: 249 lb 4.8 oz (113.082 kg)   Skin is well healed. Some swelling associated with the seroma in the superior aspect of the breast.   IMPRESSION: Wanda Moore is a 54 y.o. female doing well status post breast  PLAN:  She is doing well. We discussed the need for follow up every 4-6 months which she has scheduled.  We discussed the need for yearly mammograms which she can schedule with her OBGYN or with medical oncology. We discussed the need for sun protection in the treated area.  She can always call me with questions.  I will follow up with her on an as needed basis.   She will contact Dr. Pollie Friar office prn if her seroma starts to hurt and otherwise see him in 6 months.     Thea Silversmith, MD

## 2015-08-01 ENCOUNTER — Encounter: Payer: Self-pay | Admitting: Family

## 2015-08-03 ENCOUNTER — Other Ambulatory Visit: Payer: Self-pay | Admitting: *Deleted

## 2015-08-03 NOTE — Progress Notes (Signed)
Received triage VM from pt regarding rescheduling 10/21 appt due to [facility] scheduled for this day. POF placed and sent to scheduling per Chestine Spore

## 2015-08-04 ENCOUNTER — Telehealth: Payer: Self-pay | Admitting: Hematology and Oncology

## 2015-08-04 NOTE — Telephone Encounter (Signed)
Returned patients call as she had requested to reschedule her survivorship

## 2015-08-27 ENCOUNTER — Encounter: Payer: BLUE CROSS/BLUE SHIELD | Admitting: Nurse Practitioner

## 2015-08-31 ENCOUNTER — Ambulatory Visit (HOSPITAL_BASED_OUTPATIENT_CLINIC_OR_DEPARTMENT_OTHER): Payer: BLUE CROSS/BLUE SHIELD | Admitting: Nurse Practitioner

## 2015-08-31 ENCOUNTER — Encounter: Payer: Self-pay | Admitting: Nurse Practitioner

## 2015-08-31 VITALS — BP 136/83 | HR 67 | Temp 98.1°F | Resp 18 | Ht 70.0 in | Wt 247.1 lb

## 2015-08-31 DIAGNOSIS — N951 Menopausal and female climacteric states: Secondary | ICD-10-CM | POA: Diagnosis not present

## 2015-08-31 DIAGNOSIS — C50512 Malignant neoplasm of lower-outer quadrant of left female breast: Secondary | ICD-10-CM | POA: Diagnosis not present

## 2015-08-31 DIAGNOSIS — M2550 Pain in unspecified joint: Secondary | ICD-10-CM

## 2015-08-31 DIAGNOSIS — G4700 Insomnia, unspecified: Secondary | ICD-10-CM | POA: Diagnosis not present

## 2015-08-31 NOTE — Progress Notes (Signed)
CLINIC:  Cancer Survivorship   REASON FOR VISIT:  Routine follow-up post-treatment for a recent history of breast cancer.  BRIEF ONCOLOGIC HISTORY:    Breast cancer of lower-outer quadrant of left female breast ([place])   10/14/2013 Mammogram Left breast: new calcifications warranting further imaging   11/04/2013 Mammogram Left breast: There is a 2 mm group of round calcifications in the LOQ posteriorly. No associated mass or architectural distortion. F/U in 6 months.   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   03/25/2015 Definitive Surgery Left lumpectomy/SLNB [name]): Invasive ductal carcinoma, grade 2, measuring 2.1 cm, negative for LVID, HER2/neu repeated and remains negative (ratio 1.36), DCIS with necrosis and calcifications, 1 LN removed and negative for maliganacy (0/1)    03/25/2015 Pathologic Stage Stage IIA: pT2 pN0   03/25/2015 Oncotype testing Score 13 (8% ROR). No chemotherapy  [name]).   05/03/2015 - 06/08/2015 Radiation Therapy Adjuvant RT [name]): Left breast 45 Gy over 25 fractions. Left breast boost 16 Gy over 8 fractions. Total dose 61 Gy   06/28/2015 -  Anti-estrogen oral therapy Anastrozole 1 mg daily. Planned duration of therapy 5 years.    INTERVAL HISTORY:  Wanda Moore presents to the [facility] today for our initial meeting to review her survivorship care plan detailing her treatment course for breast cancer, as  well as monitoring long-term side effects of that treatment, education regarding health maintenance, screening, and overall wellness and health promotion.     Overall, Wanda Moore reports feeling quite well since completing her radiation therapy approximately 3 months ago.  She still has some slight darkening of the skin over her left breast secondary to the radiation but her fatigue is much improved. She denies any headache, cough, or change in weight. She continues with a seroma in her left breast, but states that it is smaller - now the size of an apple versus a grapefruit a few months back. She began her anastrozole in August 2016 and has experienced considerable hot flashes and joint pain.  She is now on venlafaxine 37.5 mg and says that the hot flashes are bearable, but bothersome. They do interfere with her sleep as she has difficulty staying asleep and has some night sweats.   She is attempting to wait until her next appointment with Dr. [name] before considering any change in medication.  She has began exercising and wonders if this may be contributing to her increased aches / pains.    REVIEW OF SYSTEMS:  General: Hot flashes as above. Denies fever, chills. Cardiac: Occasional palpitations with anxious episodes, occuring rarely.  Denies chest pain and lower extremity edema.  Respiratory: Denies shortness of breath and dyspnea on exertion.  GI: Denies abdominal pain, constipation, diarrhea, nausea, or vomiting.  GU: Denies dysuria, hematuria, vaginal bleeding, vaginal discharge, or vaginal dryness.  Musculoskeletal: Joint and bone pain as above.  Neuro: Denies recent falls or peripheral neuropathy. Skin: Denies rash, pruritis, or open wounds.  Breast: Seroma in left breast as above. Denies  any new nodularity, masses, tenderness, nipple changes, or nipple discharge.  Psych: Occasional episodes of anxiety, but these are lessening, as above.  Insomnia.  Denies depression or memory loss.   A  14-point review of systems was completed and was negative, except as noted above.   ONCOLOGY TREATMENT TEAM:  1. Surgeon:  Dr. [name] at [facility]  2. Medical Oncologist: Dr. [name] 3. Radiation Oncologist: Dr. [name]    PAST MEDICAL/SURGICAL HISTORY:  Past Medical History  Diagnosis Date  . Esophageal reflux   . Diverticulosis of colon (without mention of hemorrhage)   . Recurrent UTI   . Barrett esophagus 2008  . Hiatal hernia 2013    large  . Stricture and stenosis of esophagus 2013  . Cough 08-03-2012     nonproductive  . Melanoma ([place])     removed from betwen shoulder blades  . Distal radius fracture, left 12/05/2012  . Allergy   . Breast cancer of lower-outer quadrant of left female breast ([place]) 03/15/2015  . Breast cancer ([place])   . Anxiety    Past Surgical History  Procedure Laterality Date  . Cesarean section  1992  . Melanoma excision      back  . Cholecystectomy    . Laparoscopic nissen fundoplication  12/11/4268    Procedure: LAPAROSCOPIC NISSEN FUNDOPLICATION;  Surgeon: Pedro Earls, MD;  Location: [facility];  Service: General;  Laterality: N/A;  HIATAL HERNIA REPAIR  . Open reduction internal fixation (orif) distal radial fracture  12/05/2012    Procedure: OPEN REDUCTION INTERNAL FIXATION (ORIF) DISTAL RADIAL FRACTURE;  Surgeon: Johnny Bridge, MD;  Location: [place];  Service: Orthopedics;  Laterality: Left;  . Colonoscopy  03/25/2007    Diverticulosis   . Esophagogastroduodenoscopy  04/10/2012    HH, and Stricture  . Breast lumpectomy with radioactive seed and sentinel lymph node biopsy Left 03/25/2015    Procedure: LEFT BREAST LUMPECTOMY WITH RADIOACTIVE SEED AND LEFT AXILLARY SENTINEL LYMPH NODE BIOPSY;  Surgeon: Alphonsa Overall, MD;  Location: [place];  Service: General;  Laterality: Left;     ALLERGIES:  Allergies  Allergen Reactions  . Buprenex [Buprenorphine] Itching     CURRENT MEDICATIONS:    Current Outpatient Prescriptions on File Prior to Visit  Medication Sig Dispense Refill  . acetaminophen (TYLENOL) 325 MG tablet Take 650 mg by mouth every 6 (six) hours as needed.    Marland Kitchen anastrozole (ARIMIDEX) 1 MG tablet Take 1 tablet (1 mg total) by mouth daily. 90 tablet 3  . Calcium Carbonate-Vitamin D (CALCIUM-VITAMIN D) 500-200 MG-UNIT per tablet Take 1 tablet by mouth 2 (two) times daily.    Marland Kitchen venlafaxine XR (EFFEXOR-XR) 37.5 MG 24 hr capsule Take 1 capsule (37.5 mg total) by mouth daily with breakfast. 30 capsule 6   No current facility-administered medications on file prior to visit.     ONCOLOGIC FAMILY HISTORY:  Family History  Problem Relation Age of Onset  . Breast cancer Sister   . Cancer Sister 14    breast (half sister - same mother)  . Hypertension Mother   . Diabetes Mother   . Heart disease Mother   . Lung cancer Father   . Other Father     father was adopted - no paternal family history information  . Colon cancer Neg Hx   . Diabetes Maternal Grandmother      GENETIC COUNSELING/TESTING: Performed 03/18/2015: Genetic testing: BreastNext panel revealed no clinically significant variant at ATM, BARD1,  BRCA1, BRCA2, BRIP1, CDH1, CHEK2, MRE11A, MUTYH, NBN, NF1, PALB2, PTEN, RAD50, RAD51C, RAD51D, and TP53.   SOCIAL HISTORY:  Wanda Moore is married and lives with her spouse in [place], [place].  She has 2 children.  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  She denies any current or history of tobacco or illicit drug use.  She does report social alcohol use.     PHYSICAL EXAMINATION:  Vital Signs:   Filed Vitals:   08/31/15 1105  BP: 136/83  Pulse: 67  Temp: 98.1 F (36.7 C)  Resp: 18   ECOG Performance Status: 0 General: Well-nourished, well-appearing female in no acute distress.  She is unaccompanied  in clinic today.   HEENT: Head is atraumatic and normocephalic.  Pupils equal and reactive to light and accomodation.  Conjunctivae clear without exudate.  Sclerae anicteric. Oral mucosa is pink, moist, and intact without lesions.  Oropharynx is pink without lesions or erythema.  Lymph: No cervical, supraclavicular, infraclavicular, or axillary lymphadenopathy noted on palpation.  Cardiovascular: Regular rate and rhythm without murmurs, rubs, or gallops. Respiratory: Clear to auscultation bilaterally. Chest expansion symmetric without accessory muscle use on inspiration or expiration.  GI: Abdomen soft and round. No tenderness to palpation. Bowel sounds normoactive in 4 quadrants.  GU: Deferred.  Musculoskeletal: Muscle strength 5/5 in all extremities.   Neuro: No focal deficits. Steady gait.  Psych: Mood and affect normal and appropriate for situation.  Extremities: No edema, cyanosis, or clubbing.  Skin: Warm and dry. No open lesions noted.   LABORATORY DATA:  None for this visit.  DIAGNOSTIC IMAGING:  None for this visit.     ASSESSMENT AND PLAN:   1. 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  Wanda Moore is doing well without clinical symptoms worrisome for disease recurrence at this time.  She will follow-up with her medical oncologist,  Dr. [name], in December 2016 with history and physical exam per surveillance protocol.  She will continue her anti-estrogen therapy with anastrozole as prescribed by Dr. [name]. We have discussed her hot flashes, joint pain, and insomnia that I believe are related to her anastrozole.  We discussed the balance between side effects and quality of life, and our desire to aggressively manage her side effects to limit their negative effect on her quality of life.  She will continue to monitor them at this time and if she feels they are unbearable, she will notify us so that we  can make changes (potentially increasing her venlafaxine and discussing a change in endocrine therapy). We have discussed strategies to maximize her sleep including sleep hygiene techniques and use of things such as melatonin.  A comprehensive survivorship care plan and treatment summary was reviewed with the patient today detailing her breast cancer diagnosis, treatment course, potential late/long-term effects of treatment, appropriate follow-up care with recommendations for the future, and patient education resources.  A copy of this summary, along with a letter will be sent to the patient's primary care provider via in basket message after today's visit.  Wanda Moore is welcome to return to the Survivorship Clinic in the future, as needed; no follow-up will be scheduled at this time.    2. Bone health:  Given Wanda Moore's age/history of breast cancer and her current treatment regimen including anti-estrogen therapy with anastrozole, she  is at risk for bone demineralization.  Per our records, her last DEXA scan was in August 2016 and was normal.  We will continue to monitor this in the future.  In the meantime, she was encouraged to increase her consumption of foods rich in calcium and vitamin D, and to continue her calcium supplmentation as well as to increase her weight-bearing activities.  She was given education on specific activities to promote bone health.  3. Cancer screening:  Due to Wanda Moore's history and her age, she should receive screening for skin cancers, colon cancer, and gynecologic cancers.  The information and recommendations are listed on the patient's comprehensive care plan/treatment summary and were reviewed in detail with the patient.    4. Health maintenance and wellness promotion: Wanda Moore was encouraged to consume 5-7 servings of fruits and vegetables per day. We reviewed the handout about "Nutrition for Breast Cancer Survivors."  She was also encouraged to engage in moderate to  vigorous exercise for 30 minutes per day most days of the week. We discussed the [employer] fitness program, which is designed for cancer survivors to help them become more physically fit after cancer treatments.  She was instructed to limit her alcohol consumption and continue to abstain from tobacco use.   5. Support services/counseling: It is not uncommon for this period of the patient's cancer care trajectory to be one of many emotions and stressors.  Wanda Moore is currently very involved with the breast cancer support group and completing the [facility] ("Finding Your New Normal") series designed for patients after they have completed treatment. She states that these have been very helpful in dealing with her anxiety and during her time in treatment.  She was offered support today through active listening and expressive supportive counseling.  She was given information regarding our available services and encouraged to contact me with any questions or for help enrolling in any of our support group/programs.    A total of 50 minutes of face-to-face time was spent with this patient with greater than 50% of that time in counseling and care-coordination.   Sylvan Cheese, NP  Survivorship Program [facility] [phone]   Note: PRIMARY CARE PROVIDER Mauricio Po, [place] [phone]

## 2015-09-03 ENCOUNTER — Telehealth: Payer: Self-pay | Admitting: *Deleted

## 2015-09-03 NOTE — Telephone Encounter (Signed)
Patient is still having increased hot flashes. Would like to know if she can double the amount that she is taking of effexor? Message forwarded to MD Hanley Seamen.

## 2015-09-06 NOTE — Telephone Encounter (Signed)
Left message on voicemail.

## 2015-09-06 NOTE — Telephone Encounter (Signed)
Please have her take 2 tabs (37.5 mg each)

## 2015-10-19 NOTE — Assessment & Plan Note (Signed)
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 Hot flashes: Patient has had previous hot flashes and was taking Effexor. It appears that the hot flashes have gotten much worse. They're not accompanied by profound sweating especially when she works out or spends outdoors in the sun. We discussed about increasing the dosage of Effexor.   Breast cancer surveillance: 1. Breast exams every 4-6 months 2. Mammograms annually  Return to clinic in 6 months for follow-up.

## 2015-10-20 ENCOUNTER — Telehealth: Payer: Self-pay | Admitting: Hematology and Oncology

## 2015-10-20 ENCOUNTER — Ambulatory Visit (HOSPITAL_BASED_OUTPATIENT_CLINIC_OR_DEPARTMENT_OTHER): Payer: BLUE CROSS/BLUE SHIELD | Admitting: Hematology and Oncology

## 2015-10-20 ENCOUNTER — Encounter: Payer: Self-pay | Admitting: Hematology and Oncology

## 2015-10-20 VITALS — BP 141/79 | HR 74 | Temp 98.4°F | Resp 18 | Ht 70.0 in | Wt 245.4 lb

## 2015-10-20 DIAGNOSIS — M791 Myalgia: Secondary | ICD-10-CM | POA: Diagnosis not present

## 2015-10-20 DIAGNOSIS — C50512 Malignant neoplasm of lower-outer quadrant of left female breast: Secondary | ICD-10-CM | POA: Diagnosis not present

## 2015-10-20 MED ORDER — VENLAFAXINE HCL ER 75 MG PO CP24: 75.0000 mg | ORAL_CAPSULE | Freq: Every day | ORAL | Status: DC

## 2015-10-20 NOTE — Telephone Encounter (Signed)
Gave patient avs report and appointments for June.  °

## 2015-10-20 NOTE — Progress Notes (Signed)
Patient Care Team: [name], FNP as PCP - General (Family Medicine) Marchia Bond, MD as Consulting Physician (Orthopedic Surgery) Johnathan Hausen, MD as Consulting Physician (General Surgery) Druscilla Brownie, MD as Consulting Physician (Dermatology) Harle Battiest, MD as Consulting Physician (Obstetrics and Gynecology) Barbaraann Cao, OD as Referring Physician (Optometry) Sable Feil, MD as Consulting Physician (Gastroenterology) Alphonsa Overall, MD as Consulting Physician (General Surgery) Nicholas Lose, MD as Consulting Physician (Hematology and Oncology) Thea Silversmith, MD as Consulting Physician (Radiation Oncology) Mauro Kaufmann, RN as Registered Nurse Rockwell Germany, RN as Registered Nurse Holley Bouche, NP as Nurse Practitioner (Nurse Practitioner) Sylvan Cheese, NP as Nurse Practitioner (Hematology and Oncology)  DIAGNOSIS: Breast cancer of lower-outer quadrant of left female breast [facility])   Staging form: Breast, AJCC 7th Edition     Clinical stage from 03/17/2015: Stage IA (T1b, N0, M0) - Unsigned       Staging comments: Staged at breast conference on 5.11.16      Pathologic stage from 03/29/2015: Stage IIA (T2, N0, cM0) - Signed by [name], MD on 04/12/2015       Staging comments: Staged on final lumpectomy specimen by [name]    SUMMARY OF ONCOLOGIC HISTORY:   Breast cancer of lower-outer quadrant of left female breast ([name])   10/14/2013 Mammogram Left breast: new calcifications warranting further imaging   11/04/2013 Mammogram Left breast: There is a 2 mm group of round calcifications in the LOQ posteriorly. No associated mass or architectural distortion. F/U in 6 months.   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   03/25/2015 Definitive Surgery Left lumpectomy/SLNB [name]): Invasive ductal carcinoma, grade 2, measuring 2.1 cm, negative for LVID, HER2/neu repeated and remains negative (ratio 1.36), DCIS with necrosis and calcifications, 1 LN removed and negative for maliganacy (0/1)    03/25/2015 Pathologic Stage Stage IIA: pT2 pN0   03/25/2015 Oncotype testing Score 13 (8% ROR). No chemotherapy  [name]).   05/03/2015 - 06/08/2015 Radiation Therapy Adjuvant RT [name]): Left breast 45 Gy over 25 fractions. Left breast boost 16 Gy over 8 fractions. Total dose 61 Gy   06/28/2015 -  Anti-estrogen oral therapy Anastrozole 1 mg daily. Planned duration of therapy 5 years.    CHIEF COMPLIANT:  Continuing problems with hot flashes and sweats  INTERVAL HISTORY: Wanda Moore is a  54 year old with above-mentioned history of left breast cancer currently her adjuvant antiestrogen therapy with anastrozole. She had lots of hot flashes and sweats. We prescribed Effexor which helped her somewhat. I doubled the dosage of Effexor and it is helping her somewhat more. In spite of all of this she continues to have the symptoms persist. She is noticing some numbness in the left arm. She also has stiffness in the muscles that causes achiness at the end of the day.  REVIEW OF SYSTEMS:   Constitutional: Denies fevers, chills or abnormal weight loss Eyes: Denies blurriness of vision Ears, nose, mouth, throat, and face: Denies mucositis or  sore throat Respiratory: Denies cough, dyspnea or wheezes Cardiovascular: Denies palpitation, chest discomfort or lower extremity swelling Gastrointestinal:  Denies nausea,  heartburn or change in bowel habits Skin: Denies abnormal skin rashes Lymphatics: Denies new lymphadenopathy or easy bruising Neurological:Denies numbness, tingling or new weaknesses Behavioral/Psych: Mood is stable, no new changes  Breast:  Complains of a seroma in the left breast that is starting to fill up. All other systems were reviewed with the patient and are negative.  I have reviewed the past medical history, past surgical history, social history and family history with the patient and they are unchanged from previous note.  ALLERGIES:  is allergic to buprenex.  MEDICATIONS:  Current Outpatient Prescriptions  Medication Sig Dispense Refill  . acetaminophen (TYLENOL) 325 MG tablet Take 650 mg by mouth every 6 (six) hours as needed.    Marland Kitchen anastrozole (ARIMIDEX) 1 MG tablet Take 1 tablet (1 mg total) by mouth daily. 90 tablet 3  . Calcium Carbonate-Vitamin D (CALCIUM-VITAMIN D) 500-200 MG-UNIT per tablet Take 1 tablet by mouth 2 (two) times daily.    Marland Kitchen venlafaxine XR (EFFEXOR-XR) 37.5 MG 24 hr capsule Take 1 capsule (37.5 mg total) by mouth daily with breakfast. 30 capsule 6   No current facility-administered medications for this visit.    PHYSICAL EXAMINATION: ECOG PERFORMANCE STATUS: 1 - Symptomatic but completely ambulatory  Filed Vitals:   10/20/15 0934  BP: 141/79  Pulse: 74  Temp: 98.4 F (36.9 C)  Resp: 18   Filed Weights   10/20/15 0934  Weight: 245 lb 6.4 oz (111.313 kg)    GENERAL:alert, no distress and comfortable SKIN: skin color, texture, turgor are normal, no rashes or significant lesions EYES: normal, Conjunctiva are pink and non-injected, sclera clear OROPHARYNX:no exudate, no erythema and lips, buccal mucosa, and tongue normal  NECK: supple, thyroid normal size, non-tender, without nodularity LYMPH:  no palpable lymphadenopathy in the cervical, axillary or inguinal LUNGS: clear to auscultation and percussion with normal breathing effort HEART:  regular rate & rhythm and no murmurs and no lower extremity edema ABDOMEN:abdomen soft, non-tender and normal bowel sounds Musculoskeletal:no cyanosis of digits and no clubbing  NEURO:  Numbness in the left arm   LABORATORY DATA:  I have reviewed the data as listed   Chemistry      Component Value Date/Time   NA 141 07/30/2015 0732   NA 142 03/17/2015 0833   K 4.0 07/30/2015 0732   K 3.7 03/17/2015 0833   CL 105 07/30/2015 0732   CO2 26 07/30/2015 0732   CO2 24 03/17/2015 0833   BUN 13 07/30/2015 0732   BUN 8.7 03/17/2015 0833   CREATININE 0.60 07/30/2015 0732   CREATININE 0.7 03/17/2015 0833      Component Value Date/Time   CALCIUM 9.6 07/30/2015 0732   CALCIUM 8.7 03/17/2015 0833   ALKPHOS 66 07/30/2015 0732   ALKPHOS 65 03/17/2015 0833   AST 28 07/30/2015 0732   AST 15 03/17/2015 0833   ALT 45* 07/30/2015 0732   ALT 17 03/17/2015 0833   BILITOT 0.5 07/30/2015 0732   BILITOT 0.46 03/17/2015 0833       Lab Results  Component Value Date   WBC 6.1 07/30/2015   HGB 12.7 07/30/2015   HCT 37.5 07/30/2015   MCV 85.0 07/30/2015   PLT 240.0 07/30/2015   NEUTROABS 5.0 03/17/2015   ASSESSMENT & PLAN:  Breast cancer of 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 Hot flashes:  Slightly better with increased dosage of Effexor. I renewed her Effexor today. 2.  Muscle aches and pains: encourage her to do exercise. Also encouraged her to drink tonic water at bedtime.  We discussed about switching her anastrozole from morning to evening Cipro makes any difference for her hot flashes.  Breast cancer surveillance: 1. Breast exams every  6 months 2. Mammograms annually  Return to clinic in 6 months for follow-up.  No orders of the defined types were placed in this encounter.   The patient has a good understanding of the overall plan. she agrees with it. she will call with any problems that may develop before the next visit here.   Rulon Eisenmenger, MD 10/20/2015

## 2015-10-20 NOTE — Addendum Note (Signed)
Addended by: Prentiss Bells on: 10/20/2015 02:20 PM   Modules accepted: Medications

## 2015-11-29 ENCOUNTER — Other Ambulatory Visit: Payer: Self-pay | Admitting: Obstetrics & Gynecology

## 2015-11-30 LAB — CYTOLOGY - PAP

## 2016-01-26 ENCOUNTER — Telehealth: Payer: Self-pay | Admitting: *Deleted

## 2016-01-26 NOTE — Telephone Encounter (Signed)
"  I've been on anastrozole for six months.  Monday I started having splitting headache and I do not have headaches.  So horrible I stayed out of work Tuesday.  Took advil this morning.  I'm at work with headache getting strong again.  I took tylenol an hour ago.  Is this a side effect of the anastrozole?  I've been dealing with the joint pains but the headache is a new symptom.  Return number [phone].

## 2016-01-26 NOTE — Telephone Encounter (Signed)
LMOVM - Per Dr [name], Pt to hold anastrazole for 1 week then re-start.  Pt to left [place] know disposition of headaches.

## 2016-01-26 NOTE — Telephone Encounter (Signed)
Pt reports headache started Monday 3/20.  3/21 headache 8/10, pt unable to go to work, advil relieved after approx an hour.  3/22 headache 6/10, pt took Tylenol with some relief.  Pain is the back of her head, from the top of her head to the bottom and in to her neck.  Pt cannot pinpoint where the headache originates from.  I advised pt I would talk with Dr. [name] and call her back.

## 2016-02-22 ENCOUNTER — Telehealth: Payer: Self-pay | Admitting: *Deleted

## 2016-02-22 NOTE — Telephone Encounter (Signed)
Contacted patient by phone today after receiving referral for Dr. Geralyn Flash patient from Chestine Spore, NP. Patient had expressed an interest in participating in the Alliance [phone] BWEL study. However, upon chart review, it was discovered that she was not eligible for the study because patients with her tumor status must also have evidence of cancer in the lymph nodes. For the reason, patients at lower risk of recurrence are excluded from the trial, and therefore she does not qualify for enrollment. Dr. [name] also wished to have patient considered for the Alliance [id number] ABC trial, however, eligibility review for that study also revealed that node positive status was required for enrollment. This was explained to the patient, and she was thanked for her interest in the studies. Patient is willing to participate in future studies, if any become available that she might qualify for.  Dr. [name] notified of the above via email. Cindy S. Brigitte Pulse BSN, RN, [place] 02/22/2016 9:47 AM

## 2016-02-24 ENCOUNTER — Telehealth: Payer: Self-pay

## 2016-02-24 NOTE — Telephone Encounter (Signed)
Pt called at 1357 w/c/o swelling in her R foot and ankle. It has been swelling off and on for the last couple of weeks, but every day since this weekend. She is taking anastrozole and effexor. Tried to call pt for more information but she did not answer.

## 2016-02-25 NOTE — Telephone Encounter (Signed)
LMOVM - Pt to return call to clinic.   

## 2016-03-30 ENCOUNTER — Other Ambulatory Visit: Payer: Self-pay | Admitting: Hematology and Oncology

## 2016-03-30 DIAGNOSIS — N6489 Other specified disorders of breast: Secondary | ICD-10-CM | POA: Diagnosis not present

## 2016-03-30 DIAGNOSIS — Z853 Personal history of malignant neoplasm of breast: Secondary | ICD-10-CM

## 2016-03-30 DIAGNOSIS — Z85820 Personal history of malignant melanoma of skin: Secondary | ICD-10-CM | POA: Diagnosis not present

## 2016-03-30 DIAGNOSIS — C50912 Malignant neoplasm of unspecified site of left female breast: Secondary | ICD-10-CM | POA: Diagnosis not present

## 2016-04-05 ENCOUNTER — Other Ambulatory Visit: Payer: Self-pay | Admitting: *Deleted

## 2016-04-06 ENCOUNTER — Ambulatory Visit
Admission: RE | Admit: 2016-04-06 | Discharge: 2016-04-06 | Disposition: A | Discharge status: Home / Self Care | Payer: BLUE CROSS/BLUE SHIELD | Source: Ambulatory Visit | Attending: Hematology and Oncology | Admitting: Hematology and Oncology

## 2016-04-06 DIAGNOSIS — R922 Inconclusive mammogram: Secondary | ICD-10-CM | POA: Diagnosis not present

## 2016-04-06 DIAGNOSIS — Z853 Personal history of malignant neoplasm of breast: Secondary | ICD-10-CM

## 2016-04-14 ENCOUNTER — Ambulatory Visit (INDEPENDENT_AMBULATORY_CARE_PROVIDER_SITE_OTHER): Payer: BLUE CROSS/BLUE SHIELD | Admitting: Family Medicine

## 2016-04-14 VITALS — BP 110/62 | HR 74 | Temp 98.2°F | Resp 16 | Ht 72.5 in | Wt 229.0 lb

## 2016-04-14 DIAGNOSIS — L237 Allergic contact dermatitis due to plants, except food: Secondary | ICD-10-CM | POA: Diagnosis not present

## 2016-04-14 MED ORDER — PREDNISONE 5 MG (48) PO TBPK: ORAL_TABLET | ORAL | Status: DC

## 2016-04-14 MED ORDER — TRIAMCINOLONE ACETONIDE 0.5 % EX OINT: 1.0000 "application " | TOPICAL_OINTMENT | Freq: Two times a day (BID) | CUTANEOUS | Status: DC

## 2016-04-14 NOTE — Progress Notes (Signed)
Wanda Moore is a 55 y.o. female who presents to [facility] today for poison ivy dermatitis. Patient was exposed to poison ivy earlier this week. She notes a itchy rash over her face and trunk and extremities the last several days. She's tried multiple over-the-counter medications which have not helped. Her symptoms are consistent with previous episodes of poison ivy. No fevers or chills nausea vomiting or diarrhea.   Past Medical History  Diagnosis Date  . Esophageal reflux   . Diverticulosis of colon (without mention of hemorrhage)   . Recurrent UTI   . Barrett esophagus 2008  . Hiatal hernia 2013    large  . Stricture and stenosis of esophagus 2013  . Cough 08-03-2012     nonproductive  . Melanoma ([place])     removed from betwen shoulder blades  . Distal radius fracture, left 12/05/2012  . Allergy   . Breast cancer of lower-outer quadrant of left female breast ([place]) 03/15/2015  . Breast cancer ([place])   . Anxiety    Past Surgical History  Procedure Laterality Date  . Cesarean section  1992  . Melanoma excision      back  . Cholecystectomy    . Laparoscopic nissen fundoplication  [id number]    Procedure: LAPAROSCOPIC NISSEN FUNDOPLICATION;  Surgeon: Pedro Earls, MD;  Location: [facility];  Service: General;  Laterality: N/A;  HIATAL HERNIA REPAIR  . Open reduction internal fixation (orif) distal radial fracture  12/05/2012    Procedure: OPEN REDUCTION INTERNAL FIXATION (ORIF) DISTAL RADIAL FRACTURE;  Surgeon: Johnny Bridge, MD;  Location: [facility];  Service: Orthopedics;  Laterality: Left;  . Colonoscopy  03/25/2007    Diverticulosis   . Esophagogastroduodenoscopy  04/10/2012    HH, and Stricture  . Breast lumpectomy with radioactive seed and sentinel lymph node biopsy Left 03/25/2015    Procedure: LEFT BREAST LUMPECTOMY WITH RADIOACTIVE SEED AND LEFT AXILLARY SENTINEL LYMPH NODE BIOPSY;  Surgeon: Alphonsa Overall, MD;  Location: [facility];  Service:  General;  Laterality: Left;   Social History  Substance Use Topics  . Smoking status: Never Smoker   . Smokeless tobacco: Never Used  . Alcohol Use: 0.0 oz/week    0 Standard drinks or equivalent per week     Comment: socially   ROS as above Medications: Current Outpatient Prescriptions  Medication Sig Dispense Refill  . acetaminophen (TYLENOL) 325 MG tablet Take 650 mg by mouth every 6 (six) hours as needed.    Marland Kitchen anastrozole (ARIMIDEX) 1 MG tablet Take 1 tablet (1 mg total) by mouth daily. 90 tablet 3  . Calcium Carbonate-Vitamin D (CALCIUM-VITAMIN D) 500-200 MG-UNIT per tablet Take 1 tablet by mouth 2 (two) times daily.    Marland Kitchen venlafaxine XR (EFFEXOR-XR) 75 MG 24 hr capsule Take 1 capsule (75 mg total) by mouth daily with breakfast. 30 capsule 11  . predniSONE (STERAPRED UNI-PAK 48 TAB) 5 MG (48) TBPK tablet 12 day dosepack po 48 tablet 0  . triamcinolone ointment (KENALOG) 0.5 % Apply 1 application topically 2 (two) times daily. To affected area, avoid eyes and face 30 g 3   No current facility-administered medications for this visit.   Allergies  Allergen Reactions  . Buprenex [Buprenorphine] Itching     Exam:  BP 110/62 mmHg  Pulse 74  Temp(Src) 98.2 F (36.8 C)  Resp 16  Ht 6' 0.5" (1.842 m)  Wt 229 lb (103.874 kg)  BMI 30.61 kg/m2  SpO2 97% Gen: Well  NAD Skin: Erythematous maculopapular rash with some vesicles on face trunk and extremities  No results found for this or any previous visit (from the past 24 hour(s)). No results found.  Assessment and Plan: 55 y.o. female with poison ivy dermatitis. Treat with prednisone Dosepak and triamcinolone ointment. Return as needed.  Discussed warning signs or symptoms. Please see discharge instructions. Patient expresses understanding.

## 2016-04-14 NOTE — Patient Instructions (Addendum)
Thank you for coming in today. Take prednisone for 12 days.  Use the ointment on the bad spots.  Return as needed.   Poison [employer] ivy is a inflammation of the skin (contact dermatitis) caused by touching the allergens on the leaves of the ivy plant following previous exposure to the plant. The rash usually appears 48 hours after exposure. The rash is usually bumps (papules) or blisters (vesicles) in a linear pattern. Depending on your own sensitivity, the rash may simply cause redness and itching, or it may also progress to blisters which may break open. These must be well cared for to prevent secondary bacterial (germ) infection, followed by scarring. Keep any open areas dry, clean, dressed, and covered with an antibacterial ointment if needed. The eyes may also get puffy. The puffiness is worst in the morning and gets better as the day progresses. This dermatitis usually heals without scarring, within 2 to 3 weeks without treatment. HOME CARE INSTRUCTIONS  Thoroughly wash with soap and water as soon as you have been exposed to poison ivy. You have about one half hour to remove the plant resin before it will cause the rash. This washing will destroy the oil or antigen on the skin that is causing, or will cause, the rash. Be sure to wash under your fingernails as any plant resin there will continue to spread the rash. Do not rub skin vigorously when washing affected area. Poison ivy cannot spread if no oil from the plant remains on your body. A rash that has progressed to weeping sores will not spread the rash unless you have not washed thoroughly. It is also important to wash any clothes you have been wearing as these may carry active allergens. The rash will return if you wear the unwashed clothing, even several days later. Avoidance of the plant in the future is the best measure. Poison ivy plant can be recognized by the number of leaves. Generally, poison ivy has three leaves with flowering  branches on a single stem. Diphenhydramine may be purchased over the counter and used as needed for itching. Do not drive with this medication if it makes you drowsy.Ask your caregiver about medication for children. SEEK MEDICAL CARE IF:  Open sores develop.  Redness spreads beyond area of rash.  You notice purulent (pus-like) discharge.  You have increased pain.  Other signs of infection develop (such as fever).   This information is not intended to replace advice given to you by your health care provider. Make sure you discuss any questions you have with your health care provider.   Document Released: 10/20/2000 Document Revised: 01/15/2012 Document Reviewed: 03/31/2015 Elsevier Interactive Patient Education 2016 [employer].    IF you received an x-ray today, you will receive an invoice from [facility]. Please contact [facility] at [phone] with questions or concerns regarding your invoice.   IF you received labwork today, you will receive an invoice from [employer]. Please contact [facility] at [phone] with questions or concerns regarding your invoice.   Our billing staff will not be able to assist you with questions regarding bills from these companies.  You will be contacted with the lab results as soon as they are available. The fastest way to get your results is to activate your My Chart account. Instructions are located on the last page of this paperwork. If you have not heard from [place] regarding the results in 2 weeks, please contact this office.

## 2016-04-17 ENCOUNTER — Telehealth: Payer: Self-pay | Admitting: Gastroenterology

## 2016-04-17 NOTE — Telephone Encounter (Signed)
Patient states yesterday when she had a bowel movement she had bright, red blood in it. She had bright, red blood again today when she had a bowel movement and had it on the tissue when she wiped. Denies constipation or pain. She has never had hemorrhoids. Former Careers information officer pt. Saw Jessica in 2015.Schedule with Alonza Bogus, PA on 04/21/16.

## 2016-04-19 ENCOUNTER — Ambulatory Visit (HOSPITAL_BASED_OUTPATIENT_CLINIC_OR_DEPARTMENT_OTHER): Payer: BLUE CROSS/BLUE SHIELD | Admitting: Hematology and Oncology

## 2016-04-19 ENCOUNTER — Telehealth: Payer: Self-pay | Admitting: Hematology and Oncology

## 2016-04-19 ENCOUNTER — Encounter: Payer: Self-pay | Admitting: Hematology and Oncology

## 2016-04-19 VITALS — BP 131/76 | HR 65 | Temp 98.2°F | Resp 18 | Ht 72.5 in | Wt 227.0 lb

## 2016-04-19 DIAGNOSIS — N951 Menopausal and female climacteric states: Secondary | ICD-10-CM | POA: Diagnosis not present

## 2016-04-19 DIAGNOSIS — R51 Headache: Secondary | ICD-10-CM | POA: Diagnosis not present

## 2016-04-19 DIAGNOSIS — C50512 Malignant neoplasm of lower-outer quadrant of left female breast: Secondary | ICD-10-CM

## 2016-04-19 DIAGNOSIS — M791 Myalgia: Secondary | ICD-10-CM

## 2016-04-19 NOTE — Telephone Encounter (Signed)
appt made and avs printed °

## 2016-04-19 NOTE — Assessment & Plan Note (Signed)
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 Hot flashes: Slightly better with increased dosage of Effexor. I renewed her Effexor today. 2. Muscle aches and pains: encourage her to do exercise. Also encouraged her to drink tonic water at bedtime. We discussed about switching her anastrozole from morning to evening  Breast Cancer Surveillance: 1. Breast exam 04/19/2016: Normal 2. Mammogram 04/06/2016 No abnormalities. Postsurgical changes. Breast Density Category C. I recommended that she get 3-D mammograms for surveillance. Discussed the differences between different breast density categories.    Return to clinic in 6 months for follow-up.

## 2016-04-19 NOTE — Progress Notes (Signed)
Patient Care Team: [name], FNP as PCP - General (Family Medicine) Marchia Bond, MD as Consulting Physician (Orthopedic Surgery) Johnathan Hausen, MD as Consulting Physician (General Surgery) Druscilla Brownie, MD as Consulting Physician (Dermatology) Harle Battiest, MD as Consulting Physician (Obstetrics and Gynecology) Barbaraann Cao, OD as Referring Physician (Optometry) Sable Feil, MD as Consulting Physician (Gastroenterology) Alphonsa Overall, MD as Consulting Physician (General Surgery) Nicholas Lose, MD as Consulting Physician (Hematology and Oncology) Thea Silversmith, MD as Consulting Physician (Radiation Oncology) Mauro Kaufmann, RN as Registered Nurse Rockwell Germany, RN as Registered Nurse Holley Bouche, NP as Nurse Practitioner (Nurse Practitioner) Sylvan Cheese, NP as Nurse Practitioner (Hematology and Oncology)  DIAGNOSIS: Breast cancer of lower-outer quadrant of left female breast [facility])   Staging form: Breast, AJCC 7th Edition     Clinical stage from 03/17/2015: Stage IA (T1b, N0, M0) - Unsigned       Staging comments: Staged at breast conference on 5.11.16      Pathologic stage from 03/29/2015: Stage IIA (T2, N0, cM0) - Signed by [name], MD on 04/12/2015       Staging comments: Staged on final lumpectomy specimen by [name]    SUMMARY OF ONCOLOGIC HISTORY:   Breast cancer of lower-outer quadrant of left female breast ([place])   10/14/2013 Mammogram Left breast: new calcifications warranting further imaging   11/04/2013 Mammogram Left breast: There is a 56 mm group of round calcifications in the LOQ posteriorly. No associated mass or architectural distortion. F/U in 6 months.   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   03/25/2015 Definitive Surgery Left lumpectomy/SLNB [name]): Invasive ductal carcinoma, grade 2, measuring 2.1 cm, negative for LVID, HER2/neu repeated and remains negative (ratio 1.36), DCIS with necrosis and calcifications, 1 LN removed and negative for maliganacy (0/1)    03/25/2015 Pathologic Stage Stage IIA: pT2 pN0   03/25/2015 Oncotype testing Score 13 (8% ROR). No chemotherapy  [name]).   05/03/2015 - 06/08/2015 Radiation Therapy Adjuvant RT [name]): Left breast 45 Gy over 25 fractions. Left breast boost 16 Gy over 8 fractions. Total dose 61 Gy   06/28/2015 -  Anti-estrogen oral therapy Anastrozole 1 mg daily. Planned duration of therapy 5 years.    CHIEF COMPLIANT: Follow-up on anastrozole  INTERVAL HISTORY: Wanda Moore is a 55 year old with above-mentioned history of left breast cancer treated with lumpectomy and radiation is currently on anastrozole therapy. She appears to be tolerating it extremely well. Hot flashes have improved with Effexor. Musculoskeletal aches and pains have improved with yoga and physical exercises. She had lost 25 pounds by exercising and is very happy about it.  REVIEW OF SYSTEMS:   Constitutional: Denies fevers, chills or abnormal weight loss Eyes: Denies blurriness of vision Ears, nose, mouth, throat, and face: Denies mucositis or sore throat Respiratory: Denies cough, dyspnea or wheezes Cardiovascular: Denies palpitation, chest discomfort Gastrointestinal:  Denies nausea, heartburn or change in bowel habits Skin: Denies abnormal skin rashes Lymphatics: Denies new  lymphadenopathy or easy bruising Neurological:Denies numbness,  tingling or new weaknesses Behavioral/Psych: Mood is stable, no new changes  Extremities: No lower extremity edema Breast:  denies any pain or lumps or nodules in either breasts All other systems were reviewed with the patient and are negative.  I have reviewed the past medical history, past surgical history, social history and family history with the patient and they are unchanged from previous note.  ALLERGIES:  is allergic to buprenex.  MEDICATIONS:  Current Outpatient Prescriptions  Medication Sig Dispense Refill  . acetaminophen (TYLENOL) 325 MG tablet Take 650 mg by mouth every 6 (six) hours as needed.    Marland Kitchen anastrozole (ARIMIDEX) 1 MG tablet Take 1 tablet (1 mg total) by mouth daily. 90 tablet 3  . Calcium Carbonate-Vitamin D (CALCIUM-VITAMIN D) 500-200 MG-UNIT per tablet Take 1 tablet by mouth 2 (two) times daily.    . predniSONE (STERAPRED UNI-PAK 48 TAB) 5 MG (48) TBPK tablet 12 day dosepack po 48 tablet 0  . triamcinolone ointment (KENALOG) 0.5 % Apply 1 application topically 2 (two) times daily. To affected area, avoid eyes and face 30 g 3  . venlafaxine XR (EFFEXOR-XR) 75 MG 24 hr capsule Take 1 capsule (75 mg total) by mouth daily with breakfast. 30 capsule 11   No current facility-administered medications for this visit.    PHYSICAL EXAMINATION: ECOG PERFORMANCE STATUS: 1 - Symptomatic but completely ambulatory  Filed Vitals:   04/19/16 0822  BP: 131/76  Pulse: 65  Temp: 98.2 F (36.8 C)  Resp: 18   Filed Weights   04/19/16 0822  Weight: 227 lb (102.967 kg)    GENERAL:alert, no distress and comfortable SKIN: skin color, texture, turgor are normal, no rashes or significant lesions EYES: normal, Conjunctiva are pink and non-injected, sclera clear OROPHARYNX:no exudate, no erythema and lips, buccal mucosa, and tongue normal  NECK: supple, thyroid normal size, non-tender, without nodularity LYMPH:  no palpable lymphadenopathy in the cervical, axillary or  inguinal LUNGS: clear to auscultation and percussion with normal breathing effort HEART: regular rate & rhythm and no murmurs and no lower extremity edema ABDOMEN:abdomen soft, non-tender and normal bowel sounds MUSCULOSKELETAL:no cyanosis of digits and no clubbing  NEURO: alert & oriented x 3 with fluent speech, no focal motor/sensory deficits EXTREMITIES: No lower extremity edema BREAST: No palpable masses or nodules in either right or left breasts. No palpable axillary supraclavicular or infraclavicular adenopathy no breast tenderness or nipple discharge. (exam performed in the presence of a chaperone)  LABORATORY DATA:  I have reviewed the data as listed   Chemistry      Component Value Date/Time   NA 141 07/30/2015 0732   NA 142 03/17/2015 0833   K 4.0 07/30/2015 0732   K 3.7 03/17/2015 0833   CL 105 07/30/2015 0732   CO2 26 07/30/2015 0732   CO2 24 03/17/2015 0833   BUN 13 07/30/2015 0732   BUN 8.7 03/17/2015 0833   CREATININE 0.60 07/30/2015 0732   CREATININE 0.7 03/17/2015 0833      Component Value Date/Time   CALCIUM 9.6 07/30/2015 0732   CALCIUM 8.7 03/17/2015 0833   ALKPHOS 66 07/30/2015 0732   ALKPHOS 65 03/17/2015 0833   AST 28 07/30/2015 0732   AST 15 03/17/2015 0833   ALT 45* 07/30/2015 0732   ALT 17 03/17/2015 0833   BILITOT 0.5 07/30/2015 0732   BILITOT 0.46 03/17/2015 0833      Lab Results  Component Value Date   WBC 6.1  07/30/2015   HGB 12.7 07/30/2015   HCT 37.5 07/30/2015   MCV 85.0 07/30/2015   PLT 240.0 07/30/2015   NEUTROABS 5.0 03/17/2015   ASSESSMENT & PLAN:  Breast cancer of lower-outer quadrant of left female breast Left lumpectomy 03/25/2015: Invasive ductal carcinoma, 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 Hot flashes: Slightly better with increased dosage of Effexor. She is doing much better 2. Muscle aches and pains: Patient has completed the [employer] live strong program and since then has lost 25 pounds and she exercises regularly and watches what she eats. She extremely happy about her current weight and continues to stay active. Because of this her muscle aches and pains have improved.  Breast Cancer Surveillance: 1. Breast exam 04/19/2016: Benign, recently seroma was drained 2. Mammogram 04/06/2016 No abnormalities. Postsurgical changes. Breast Density Category C. I recommended that she get 3-D mammograms for surveillance. Discussed the differences between different breast density categories.    Return to clinic in 6 months for follow-up after that once a year.   No orders of the defined types were placed in this encounter.   The patient has a good understanding of the overall plan. she agrees with it. she will call with any problems that may develop before the next visit here.   Rulon Eisenmenger, MD 04/19/2016

## 2016-04-21 ENCOUNTER — Encounter: Payer: Self-pay | Admitting: Gastroenterology

## 2016-04-21 ENCOUNTER — Ambulatory Visit (INDEPENDENT_AMBULATORY_CARE_PROVIDER_SITE_OTHER): Payer: BLUE CROSS/BLUE SHIELD | Admitting: Gastroenterology

## 2016-04-21 VITALS — BP 132/72 | HR 60 | Ht 70.0 in | Wt 226.0 lb

## 2016-04-21 DIAGNOSIS — K625 Hemorrhage of anus and rectum: Secondary | ICD-10-CM | POA: Diagnosis not present

## 2016-04-21 MED ORDER — HYDROCORTISONE 2.5 % RE CREA
TOPICAL_CREAM | RECTAL | Status: DC
Start: 2016-04-21 — End: 2017-10-19

## 2016-04-21 MED ORDER — NA SULFATE-K SULFATE-MG SULF 17.5-3.13-1.6 GM/177ML PO SOLN: 1.0000 | Freq: Once | ORAL | Status: DC

## 2016-04-21 NOTE — Patient Instructions (Signed)
  We have sent the following medications to your pharmacy for you to pick up at your convenience:  Hydrocortisone cream   You have been scheduled for a colonoscopy. Please follow written instructions given to you at your visit today.  Please pick up your prep supplies at the pharmacy within the next 1-3 days. If you use inhalers (even only as needed), please bring them with you on the day of your procedure. Your physician has requested that you go to www.startemmi.com and enter the access code given to you at your visit today. This web site gives a general overview about your procedure. However, you should still follow specific instructions given to you by our office regarding your preparation for the procedure.

## 2016-04-21 NOTE — Progress Notes (Signed)
     04/21/2016 Wanda Moore [id number] 1961/02/29   History of Present Illness:  This is a 55 year old female who is previously known to Dr. Sharlett Iles, and hercare is going to be assumed by Dr. Fuller Plan in the interim (he was supervising MD in 04/2014 when the patient was seen by me).  She is usually followed here for Barrett's esophagus and reflux. She is here today on this occasion, however, for complaints of the bleeding. She says that this started on Sunday evening when she noticed bright red blood in the stool and on the toilet paper after a BM.  She said the stool was just normal, not hard or had any straining. This happened again in the middle the night with some bright red blood on the toilet paper even when she did not have a bowel movement. It occurred then on Monday once, but she's not had any further bleeding since that time.  it was described as a moderate amount and enough to concern her. She denies any other associated symptoms. Her last colonoscopy was in May 2008 at which time she was only found have normal diverticulosis.  Current Medications, Allergies, Past Medical History, Past Surgical History, Family History and Social History were reviewed in [employer] record.   Physical Exam: BP 132/72 mmHg  Pulse 60  Ht 5\' 10"  (1.778 m)  Wt 226 lb (102.513 kg)  BMI 32.43 kg/m2 General: Well developed white female in no acute distress Head: Normocephalic and atraumatic Eyes:  Sclerae anicteric, conjunctiva pink  Ears: Normal auditory acuity Lungs: Clear throughout to auscultation Heart: Regular rate and rhythm Abdomen: Soft, non-distended.  Normal bowel sounds.  Non-tender. Rectal:  No external hemorrhoids noted.  Small perianal skin tear without bleeding.  DRE did not reveal any masses.  Trace brown stool on exam glove was heme negative. Musculoskeletal: Symmetrical with no gross deformities  Extremities: No edema  Neurological: Alert oriented x 4, grossly  non-focal Psychological:  Alert and cooperative. Normal mood and affect  Assessment and Recommendations: -Rectal bleeding:  Appears to have a small perianal skin tear on exam, which may have been the source of her bleeding.  Will treat with hydrocortisone cream BID for 10 days.  She's not had a colonoscopy in 9 years, however, so we will go ahead and schedule her for colonoscopy with Dr. Fuller Plan.  The risks, benefits, and alternatives to colonoscopy were discussed with the patient and she consents to proceed.

## 2016-04-23 NOTE — Progress Notes (Signed)
Reviewed and agree with management plan.  Furqan Gosselin T. Cassiopeia Florentino, MD FACG 

## 2016-04-25 ENCOUNTER — Encounter: Payer: Self-pay | Admitting: Gastroenterology

## 2016-05-05 ENCOUNTER — Ambulatory Visit (AMBULATORY_SURGERY_CENTER): Payer: BLUE CROSS/BLUE SHIELD | Admitting: Gastroenterology

## 2016-05-05 ENCOUNTER — Encounter: Payer: Self-pay | Admitting: Gastroenterology

## 2016-05-05 VITALS — BP 149/59 | HR 62 | Temp 98.4°F | Resp 11 | Ht 70.0 in | Wt 226.0 lb

## 2016-05-05 DIAGNOSIS — K921 Melena: Secondary | ICD-10-CM | POA: Diagnosis not present

## 2016-05-05 DIAGNOSIS — D125 Benign neoplasm of sigmoid colon: Secondary | ICD-10-CM | POA: Diagnosis not present

## 2016-05-05 DIAGNOSIS — K635 Polyp of colon: Secondary | ICD-10-CM | POA: Diagnosis not present

## 2016-05-05 DIAGNOSIS — Z1211 Encounter for screening for malignant neoplasm of colon: Secondary | ICD-10-CM | POA: Diagnosis not present

## 2016-05-05 MED ORDER — SODIUM CHLORIDE 0.9 % IV SOLN: 500.0000 mL | INTRAVENOUS | Status: DC

## 2016-05-05 NOTE — Op Note (Signed)
[place] Patient Name: Wanda Moore Procedure Date: 05/05/2016 9:34 AM MRN: [id number] Endoscopist: Ladene Artist , MD Age: 55 Referring MD:  Date of Birth: Jun 22, 1961 Gender: Female Account #: [id number] Procedure:                Colonoscopy Indications:              Evaluation of unexplained GI bleeding, Hematochezia Medicines:                Monitored Anesthesia Care Procedure:                Pre-Anesthesia Assessment:                           - Prior to the procedure, a History and Physical                            was performed, and patient medications and                            allergies were reviewed. The patient's tolerance of                            previous anesthesia was also reviewed. The risks                            and benefits of the procedure and the sedation                            options and risks were discussed with the patient.                            All questions were answered, and informed consent                            was obtained. Prior Anticoagulants: The patient has                            taken no previous anticoagulant or antiplatelet                            agents. ASA Grade Assessment: II - A patient with                            mild systemic disease. After reviewing the risks                            and benefits, the patient was deemed in                            satisfactory condition to undergo the procedure.                           After obtaining informed consent, the colonoscope  was passed under direct vision. Throughout the                            procedure, the patient's blood pressure, pulse, and                            oxygen saturations were monitored continuously. The                            Model PCF-H190L [id number]) scope was introduced                            through the anus and advanced to the the cecum,   identified by appendiceal orifice and ileocecal                            valve. The ileocecal valve, appendiceal orifice,                            and rectum were photographed. The quality of the                            bowel preparation was good. The colonoscopy was                            performed without difficulty. The patient tolerated                            the procedure well. Scope In: 9:47:30 AM Scope Out: 10:03:11 AM Scope Withdrawal Time: 0 hours 11 minutes 41 seconds  Total Procedure Duration: 0 hours 15 minutes 41 seconds  Findings:                 A 5 mm polyp was found in the sigmoid colon. The                            polyp was sessile. The polyp was removed with a                            cold snare. Resection and retrieval were complete.                           Internal hemorrhoids were found during                            retroflexion. The hemorrhoids were small and Grade                            I (internal hemorrhoids that do not prolapse).                           The exam was otherwise without abnormality on  direct and retroflexion views.                           Many small-mouthed diverticula were found in the                            sigmoid colon and descending colon. Complications:            No immediate complications. Estimated blood loss:                            None. Estimated Blood Loss:     Estimated blood loss: none. Impression:               - One 5 mm polyp in the sigmoid colon, removed with                            a cold snare. Resected and retrieved.                           - Internal hemorrhoids.                           - Diverticulosis in the sigmoid colon and in the                            descending colon. Recommendation:           - Repeat colonoscopy in 5 years for surveillance if                            polyp is precancerous, otherwise 10 years.                            - Patient has a contact number available for                            emergencies. The signs and symptoms of potential                            delayed complications were discussed with the                            patient. Return to normal activities tomorrow.                            Written discharge instructions were provided to the                            patient.                           - High fiber diet.                           - Continue present medications.                           -  Await pathology results. Ladene Artist, MD 05/05/2016 10:07:02 AM This report has been signed electronically.

## 2016-05-05 NOTE — Patient Instructions (Signed)
YOU HAD AN ENDOSCOPIC PROCEDURE TODAY AT [facility]:   Refer to the procedure report that was given to you for any specific questions about what was found during the examination.  If the procedure report does not answer your questions, please call your gastroenterologist to clarify.  If you requested that your care partner not be given the details of your procedure findings, then the procedure report has been included in a sealed envelope for you to review at your convenience later.  YOU SHOULD EXPECT: Some feelings of bloating in the abdomen. Passage of more gas than usual.  Walking can help get rid of the air that was put into your GI tract during the procedure and reduce the bloating. If you had a lower endoscopy (such as a colonoscopy or flexible sigmoidoscopy) you may notice spotting of blood in your stool or on the toilet paper. If you underwent a bowel prep for your procedure, you may not have a normal bowel movement for a few days.  Please Note:  You might notice some irritation and congestion in your nose or some drainage.  This is from the oxygen used during your procedure.  There is no need for concern and it should clear up in a day or so.  SYMPTOMS TO REPORT IMMEDIATELY:   Following lower endoscopy (colonoscopy or flexible sigmoidoscopy):  Excessive amounts of blood in the stool  Significant tenderness or worsening of abdominal pains  Swelling of the abdomen that is new, acute  Fever of 100F or higher    For urgent or emergent issues, a gastroenterologist can be reached at any hour by calling [phone].   DIET: Your first meal following the procedure should be a small meal and then it is ok to progress to your normal diet. Heavy or fried foods are harder to digest and may make you feel nauseous or bloated.  Likewise, meals heavy in dairy and vegetables can increase bloating.  Drink plenty of fluids but you should avoid alcoholic beverages for 24  hours.  ACTIVITY:  You should plan to take it easy for the rest of today and you should NOT DRIVE or use heavy machinery until tomorrow (because of the sedation medicines used during the test).    FOLLOW UP: Our staff will call the number listed on your records the next business day following your procedure to check on you and address any questions or concerns that you may have regarding the information given to you following your procedure. If we do not reach you, we will leave a message.  However, if you are feeling well and you are not experiencing any problems, there is no need to return our call.  We will assume that you have returned to your regular daily activities without incident.  If any biopsies were taken you will be contacted by phone or by letter within the next 1-3 weeks.  Please call us at [phone] if you have not heard about the biopsies in 3 weeks.    SIGNATURES/CONFIDENTIALITY: You and/or your care partner have signed paperwork which will be entered into your electronic medical record.  These signatures attest to the fact that that the information above on your After Visit Summary has been reviewed and is understood.  Full responsibility of the confidentiality of this discharge information lies with you and/or your care-partner.   Information on polyps diverticulosis ,hemorrhoids, and high fiber diet given to you today

## 2016-05-05 NOTE — Progress Notes (Signed)
A and O x3. Report to RN. Tolerated MAC anesthesia well. 

## 2016-05-05 NOTE — Progress Notes (Signed)
Called to room to assist during endoscopic procedure.  Patient ID and intended procedure confirmed with present staff. Received instructions for my participation in the procedure from the performing physician.  

## 2016-05-08 ENCOUNTER — Telehealth: Payer: Self-pay | Admitting: *Deleted

## 2016-05-08 NOTE — Telephone Encounter (Signed)
  Follow up Call-  Call back number 05/05/2016  Post procedure Call Back phone  # [phone]  Permission to leave phone message Yes     No answer, left message.

## 2016-05-14 ENCOUNTER — Encounter: Payer: Self-pay | Admitting: Gastroenterology

## 2016-05-18 DIAGNOSIS — Z85820 Personal history of malignant melanoma of skin: Secondary | ICD-10-CM | POA: Diagnosis not present

## 2016-05-18 DIAGNOSIS — N6489 Other specified disorders of breast: Secondary | ICD-10-CM | POA: Diagnosis not present

## 2016-05-18 DIAGNOSIS — C50912 Malignant neoplasm of unspecified site of left female breast: Secondary | ICD-10-CM | POA: Diagnosis not present

## 2016-06-14 DIAGNOSIS — L209 Atopic dermatitis, unspecified: Secondary | ICD-10-CM | POA: Diagnosis not present

## 2016-06-29 DIAGNOSIS — Z85820 Personal history of malignant melanoma of skin: Secondary | ICD-10-CM | POA: Diagnosis not present

## 2016-06-29 DIAGNOSIS — D235 Other benign neoplasm of skin of trunk: Secondary | ICD-10-CM | POA: Diagnosis not present

## 2016-06-29 DIAGNOSIS — L821 Other seborrheic keratosis: Secondary | ICD-10-CM | POA: Diagnosis not present

## 2016-06-29 DIAGNOSIS — D1801 Hemangioma of skin and subcutaneous tissue: Secondary | ICD-10-CM | POA: Diagnosis not present

## 2016-07-12 ENCOUNTER — Other Ambulatory Visit: Payer: Self-pay

## 2016-07-12 DIAGNOSIS — C50512 Malignant neoplasm of lower-outer quadrant of left female breast: Secondary | ICD-10-CM

## 2016-07-12 MED ORDER — VENLAFAXINE HCL ER 75 MG PO CP24: 75.0000 mg | ORAL_CAPSULE | Freq: Every day | ORAL | 1 refills | Status: DC

## 2016-09-20 ENCOUNTER — Other Ambulatory Visit: Payer: Self-pay | Admitting: Hematology and Oncology

## 2016-09-20 DIAGNOSIS — C50512 Malignant neoplasm of lower-outer quadrant of left female breast: Secondary | ICD-10-CM

## 2016-09-27 DIAGNOSIS — C50912 Malignant neoplasm of unspecified site of left female breast: Secondary | ICD-10-CM | POA: Diagnosis not present

## 2016-09-27 DIAGNOSIS — N6489 Other specified disorders of breast: Secondary | ICD-10-CM | POA: Diagnosis not present

## 2016-09-27 DIAGNOSIS — Z85820 Personal history of malignant melanoma of skin: Secondary | ICD-10-CM | POA: Diagnosis not present

## 2016-10-18 NOTE — Assessment & Plan Note (Signed)
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 Hot flashes: Slightly better with increased dosage of Effexor. She is doing much better 2. Muscle aches and pains: Patient has completed the [employer] live strong program and since then has lost 25 pounds and she exercises regularly and watches what she eats. She extremely happy about her current weight and continues to stay active. Because of this her muscle aches and pains have improved.  Breast Cancer Surveillance: 1. Breast exam 10/19/2016: Benign, recently seroma was drained 2. Mammogram 04/06/2016 No abnormalities. Postsurgical changes. Breast Density Category C. I recommended that she get 3-D mammograms for surveillance. Discussed the differences between different breast density categories.    Return to clinic in1 year.

## 2016-10-19 ENCOUNTER — Encounter: Payer: Self-pay | Admitting: Hematology and Oncology

## 2016-10-19 ENCOUNTER — Ambulatory Visit (HOSPITAL_BASED_OUTPATIENT_CLINIC_OR_DEPARTMENT_OTHER): Payer: BLUE CROSS/BLUE SHIELD | Admitting: Hematology and Oncology

## 2016-10-19 DIAGNOSIS — Z170 Estrogen receptor positive status [ER+]: Secondary | ICD-10-CM

## 2016-10-19 DIAGNOSIS — N951 Menopausal and female climacteric states: Secondary | ICD-10-CM | POA: Diagnosis not present

## 2016-10-19 DIAGNOSIS — C50512 Malignant neoplasm of lower-outer quadrant of left female breast: Secondary | ICD-10-CM | POA: Diagnosis not present

## 2016-10-19 NOTE — Progress Notes (Signed)
Patient Care Team: [name], FNP as PCP - General (Family Medicine) Marchia Bond, MD as Consulting Physician (Orthopedic Surgery) Johnathan Hausen, MD as Consulting Physician (General Surgery) Druscilla Brownie, MD as Consulting Physician (Dermatology) Gus Height, MD as Consulting Physician (Obstetrics and Gynecology) Barbaraann Cao, OD as Referring Physician (Optometry) Sable Feil, MD as Consulting Physician (Gastroenterology) Alphonsa Overall, MD as Consulting Physician (General Surgery) Nicholas Lose, MD as Consulting Physician (Hematology and Oncology) Thea Silversmith, MD as Consulting Physician (Radiation Oncology) Mauro Kaufmann, RN as Registered Nurse Rockwell Germany, RN as Registered Nurse Holley Bouche, NP as Nurse Practitioner (Nurse Practitioner) Sylvan Cheese, NP as Nurse Practitioner (Hematology and Oncology)  DIAGNOSIS:  Encounter Diagnosis  Name Primary?  . Malignant neoplasm of lower-outer quadrant of left breast of female, estrogen receptor positive ([place])     SUMMARY OF ONCOLOGIC HISTORY:   Breast cancer of lower-outer quadrant of left female breast ([place])   10/14/2013 Mammogram    Left breast: new calcifications warranting further imaging      11/04/2013 Mammogram    Left breast: There is a 2 mm group of round calcifications in the LOQ posteriorly. No associated mass or architectural distortion. F/U in 6 months.      03/08/2015 Mammogram    Left breast: mild increase in the small group of faint pleomorphic calcifications located within theLOQ spanning 9 mm      03/11/2015 Initial Biopsy    Left breast core needle bx (LOQ): Invasive ductal carcinoma with DCIS with calcifications, ALH, grade 1-2, ER+ (99%), PR+ (100%), HER-2/neu negative (ratio 1.60), Ki-67 21%      03/11/2015 Clinical Stage    Stage IA: T1 N0      03/18/2015 Procedure    Genetic testing: BreastNext panel revealed no clinically significant variant at ATM, BARD1,  BRCA1, BRCA2, BRIP1, CDH1, CHEK2, MRE11A, MUTYH, NBN, NF1, PALB2, PTEN, RAD50, RAD51C, RAD51D, and TP53.      03/25/2015 Definitive Surgery    Left lumpectomy/[facility] [name]): Invasive ductal carcinoma, grade 2, measuring 2.1 cm, negative for LVID, HER2/neu repeated and remains negative (ratio 1.36), DCIS with necrosis and calcifications, 1 LN removed and negative for maliganacy (0/1)       03/25/2015 Pathologic Stage    Stage IIA: pT2 pN0      03/25/2015 Oncotype testing    Score 13 (8% ROR). No chemotherapy  [name]).      05/03/2015 - 06/08/2015 Radiation Therapy    Adjuvant RT [name]): Left breast 45 Gy over 25 fractions. Left breast boost 16 Gy over 8 fractions. Total dose 61 Gy      06/28/2015 -  Anti-estrogen oral therapy    Anastrozole 1 mg daily. Planned duration of therapy 5 years.       CHIEF COMPLIANT: Follow-up on anastrozole  INTERVAL HISTORY: Frenchie Pribyl is a 55 year old with above-mentioned history of left breast cancer treated with lumpectomy and adjuvant radiation is currently on anastrozole. She is tolerating it extremely well. She does have hot flashes but they are manageable. She takes Effexor which appears to be helping her. She continues to exercise regularly and does yoga. She continued to keep off the 25 pounds that she had lost earlier.  REVIEW OF SYSTEMS:   Constitutional: Denies fevers, chills or abnormal weight loss Eyes: Denies blurriness of vision Ears, nose, mouth, throat, and face: Denies mucositis or sore throat Respiratory: Denies cough, dyspnea or wheezes Cardiovascular: Denies palpitation, chest discomfort Gastrointestinal:  Denies nausea, heartburn or change  in bowel habits Skin: Denies abnormal skin rashes Lymphatics: Denies new lymphadenopathy or easy bruising Neurological:Denies numbness, tingling or new weaknesses Behavioral/Psych: Mood is stable, no new changes  Extremities: No lower extremity edema Breast: Seroma in the left breast  has been drained multiple times All other systems were reviewed with the patient and are negative.  I have reviewed the past medical history, past surgical history, social history and family history with the patient and they are unchanged from previous note.  ALLERGIES:  is allergic to buprenex [buprenorphine].  MEDICATIONS:  Current Outpatient Prescriptions  Medication Sig Dispense Refill  . acetaminophen (TYLENOL) 325 MG tablet Take 650 mg by mouth every 6 (six) hours as needed.    Marland Kitchen anastrozole (ARIMIDEX) 1 MG tablet TAKE 1 TABLET BY MOUTH EVERY DAY 90 tablet 2  . Calcium Carbonate-Vitamin D (CALCIUM-VITAMIN D) 500-200 MG-UNIT per tablet Take 1 tablet by mouth 2 (two) times daily.    . hydrocortisone (ANUSOL-HC) 2.5 % rectal cream Apply twice a day for 10 days 30 g 1  . triamcinolone ointment (KENALOG) 0.5 % Apply 1 application topically 2 (two) times daily. To affected area, avoid eyes and face 30 g 3  . venlafaxine XR (EFFEXOR-XR) 75 MG 24 hr capsule Take 1 capsule (75 mg total) by mouth daily with breakfast. 90 capsule 1   No current facility-administered medications for this visit.     PHYSICAL EXAMINATION: ECOG PERFORMANCE STATUS: 1 - Symptomatic but completely ambulatory  Vitals:   10/19/16 0842  BP: 129/71  Pulse: 71  Resp: 18  Temp: 100 F (37.8 C)   Filed Weights   10/19/16 0842  Weight: 227 lb 9.6 oz (103.2 kg)    GENERAL:alert, no distress and comfortable SKIN: skin color, texture, turgor are normal, no rashes or significant lesions EYES: normal, Conjunctiva are pink and non-injected, sclera clear OROPHARYNX:no exudate, no erythema and lips, buccal mucosa, and tongue normal  NECK: supple, thyroid normal size, non-tender, without nodularity LYMPH:  no palpable lymphadenopathy in the cervical, axillary or inguinal LUNGS: clear to auscultation and percussion with normal breathing effort HEART: regular rate & rhythm and no murmurs and no lower extremity  edema ABDOMEN:abdomen soft, non-tender and normal bowel sounds MUSCULOSKELETAL:no cyanosis of digits and no clubbing  NEURO: alert & oriented x 3 with fluent speech, no focal motor/sensory deficits EXTREMITIES: No lower extremity edema BREAST:Seroma left breast No palpable axillary supraclavicular or infraclavicular adenopathy no breast tenderness or nipple discharge. (exam performed in the presence of a chaperone)  LABORATORY DATA:  I have reviewed the data as listed   Chemistry      Component Value Date/Time   NA 141 07/30/2015 0732   NA 142 03/17/2015 0833   K 4.0 07/30/2015 0732   K 3.7 03/17/2015 0833   CL 105 07/30/2015 0732   CO2 26 07/30/2015 0732   CO2 24 03/17/2015 0833   BUN 13 07/30/2015 0732   BUN 8.7 03/17/2015 0833   CREATININE 0.60 07/30/2015 0732   CREATININE 0.7 03/17/2015 0833      Component Value Date/Time   CALCIUM 9.6 07/30/2015 0732   CALCIUM 8.7 03/17/2015 0833   ALKPHOS 66 07/30/2015 0732   ALKPHOS 65 03/17/2015 0833   AST 28 07/30/2015 0732   AST 15 03/17/2015 0833   ALT 45 (H) 07/30/2015 0732   ALT 17 03/17/2015 0833   BILITOT 0.5 07/30/2015 0732   BILITOT 0.46 03/17/2015 0833       Lab Results  Component Value Date  WBC 6.1 07/30/2015   HGB 12.7 07/30/2015   HCT 37.5 07/30/2015   MCV 85.0 07/30/2015   PLT 240.0 07/30/2015   NEUTROABS 5.0 03/17/2015    ASSESSMENT & PLAN:  Breast cancer of lower-outer quadrant of left female breast Left lumpectomy 03/25/2015: Invasive ductal carcinoma, 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 Hot flashes: Slightly better with increased dosage of Effexor. She is doing much better 2. Muscle aches and pains: Patient has completed the [employer] live strong program and since then has lost 25 pounds  and she exercises regularly and watches what she eats. She extremely happy about her current weight and continues to stay active. Because of this her muscle aches and pains have improved.  Breast Cancer Surveillance: 1. Breast exam 10/19/2016: Benign, recently seroma was drained Multiple times by Dr. [name] 2. Mammogram 04/06/2016 No abnormalities. Postsurgical changes. Breast Density Category C. I recommended that she get 3-D mammograms for surveillance. Discussed the differences between different breast density categories.   Return to clinic in 1 year.  No orders of the defined types were placed in this encounter.  The patient has a good understanding of the overall plan. she agrees with it. she will call with any problems that may develop before the next visit here.   Rulon Eisenmenger, MD 10/19/16

## 2016-12-19 DIAGNOSIS — Z01419 Encounter for gynecological examination (general) (routine) without abnormal findings: Secondary | ICD-10-CM | POA: Diagnosis not present

## 2016-12-19 DIAGNOSIS — Z6836 Body mass index (BMI) 36.0-36.9, adult: Secondary | ICD-10-CM | POA: Diagnosis not present

## 2016-12-19 LAB — BASIC METABOLIC PANEL
BUN: 13 mg/dL (ref 4–21)
Creatinine: 0.6 mg/dL (ref <=1.1)
Glucose: 87 mg/dL
Potassium: 4.4 mmol/L (ref 3.4–5.3)
Sodium: 142 mmol/L (ref 137–147)

## 2016-12-19 LAB — LIPID PANEL
Cholesterol: 168 mg/dL (ref 0–200)
HDL: 58 mg/dL (ref 35–70)
LDL Cholesterol: 95 mg/dL
Triglycerides: 74 mg/dL (ref 40–160)

## 2016-12-19 LAB — CBC AND DIFFERENTIAL
HCT: 37 % (ref 36–46)
Hemoglobin: 12.6 g/dL (ref 12.0–16.0)
Platelets: 246 10*3/uL (ref 150–399)

## 2016-12-19 LAB — HEPATIC FUNCTION PANEL
ALT: 18 U/L (ref 7–35)
AST: 19 U/L (ref 13–35)
Alkaline Phosphatase: 76 U/L (ref 25–125)
Bilirubin, Total: 0.7 mg/dL

## 2016-12-19 LAB — TSH: TSH: 1.23 u[IU]/mL (ref <=5.90)

## 2016-12-19 LAB — HEMOGLOBIN A1C: Hemoglobin A1C: 5.4

## 2017-01-17 ENCOUNTER — Encounter: Payer: Self-pay | Admitting: Family

## 2017-01-20 ENCOUNTER — Other Ambulatory Visit: Payer: Self-pay | Admitting: Hematology and Oncology

## 2017-01-20 DIAGNOSIS — C50512 Malignant neoplasm of lower-outer quadrant of left female breast: Secondary | ICD-10-CM

## 2017-03-26 ENCOUNTER — Other Ambulatory Visit: Payer: Self-pay | Admitting: Hematology and Oncology

## 2017-03-26 ENCOUNTER — Other Ambulatory Visit: Payer: Self-pay | Admitting: Obstetrics & Gynecology

## 2017-03-26 DIAGNOSIS — Z853 Personal history of malignant neoplasm of breast: Secondary | ICD-10-CM

## 2017-04-09 ENCOUNTER — Other Ambulatory Visit: Payer: Self-pay | Admitting: Hematology and Oncology

## 2017-04-09 ENCOUNTER — Ambulatory Visit
Admission: RE | Admit: 2017-04-09 | Discharge: 2017-04-09 | Disposition: A | Discharge status: Home / Self Care | Payer: BLUE CROSS/BLUE SHIELD | Source: Ambulatory Visit | Attending: Obstetrics & Gynecology | Admitting: Obstetrics & Gynecology

## 2017-04-09 ENCOUNTER — Ambulatory Visit
Admission: RE | Admit: 2017-04-09 | Discharge: 2017-04-09 | Disposition: A | Discharge status: Home / Self Care | Payer: BLUE CROSS/BLUE SHIELD | Source: Ambulatory Visit | Attending: Hematology and Oncology | Admitting: Hematology and Oncology

## 2017-04-09 DIAGNOSIS — R928 Other abnormal and inconclusive findings on diagnostic imaging of breast: Secondary | ICD-10-CM | POA: Diagnosis not present

## 2017-04-09 DIAGNOSIS — N6310 Unspecified lump in the right breast, unspecified quadrant: Secondary | ICD-10-CM

## 2017-04-09 DIAGNOSIS — Z853 Personal history of malignant neoplasm of breast: Secondary | ICD-10-CM

## 2017-04-09 DIAGNOSIS — N6489 Other specified disorders of breast: Secondary | ICD-10-CM | POA: Diagnosis not present

## 2017-04-09 HISTORY — DX: Personal history of irradiation: Z92.3

## 2017-06-14 ENCOUNTER — Other Ambulatory Visit: Payer: Self-pay | Admitting: Hematology and Oncology

## 2017-06-14 DIAGNOSIS — C50512 Malignant neoplasm of lower-outer quadrant of left female breast: Secondary | ICD-10-CM

## 2017-06-22 DIAGNOSIS — H1089 Other conjunctivitis: Secondary | ICD-10-CM | POA: Diagnosis not present

## 2017-06-28 DIAGNOSIS — L309 Dermatitis, unspecified: Secondary | ICD-10-CM | POA: Diagnosis not present

## 2017-06-28 DIAGNOSIS — L814 Other melanin hyperpigmentation: Secondary | ICD-10-CM | POA: Diagnosis not present

## 2017-06-28 DIAGNOSIS — L821 Other seborrheic keratosis: Secondary | ICD-10-CM | POA: Diagnosis not present

## 2017-06-28 DIAGNOSIS — D225 Melanocytic nevi of trunk: Secondary | ICD-10-CM | POA: Diagnosis not present

## 2017-07-03 DIAGNOSIS — L209 Atopic dermatitis, unspecified: Secondary | ICD-10-CM | POA: Diagnosis not present

## 2017-07-19 ENCOUNTER — Other Ambulatory Visit: Payer: Self-pay | Admitting: Hematology and Oncology

## 2017-07-19 DIAGNOSIS — C50512 Malignant neoplasm of lower-outer quadrant of left female breast: Secondary | ICD-10-CM

## 2017-10-19 ENCOUNTER — Ambulatory Visit (HOSPITAL_BASED_OUTPATIENT_CLINIC_OR_DEPARTMENT_OTHER): Payer: BLUE CROSS/BLUE SHIELD | Admitting: Hematology and Oncology

## 2017-10-19 ENCOUNTER — Telehealth: Payer: Self-pay | Admitting: Hematology and Oncology

## 2017-10-19 DIAGNOSIS — Z170 Estrogen receptor positive status [ER+]: Secondary | ICD-10-CM | POA: Diagnosis not present

## 2017-10-19 DIAGNOSIS — C50512 Malignant neoplasm of lower-outer quadrant of left female breast: Secondary | ICD-10-CM

## 2017-10-19 DIAGNOSIS — N951 Menopausal and female climacteric states: Secondary | ICD-10-CM | POA: Diagnosis not present

## 2017-10-19 MED ORDER — ANASTROZOLE 1 MG PO TABS: 1.0000 mg | ORAL_TABLET | Freq: Every day | ORAL | 3 refills | Status: DC

## 2017-10-19 NOTE — Assessment & Plan Note (Signed)
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 Hot flashes: Slightly better with increased dosage of Effexor. She is doing much better 2. Muscle aches and pains: Patient has completed the [employer] live strong program and since then has lost 25 pounds and she exercises regularly and watches what she eats. She extremely happy about her current weight and continues to stay active. Because of this her muscle aches and pains have improved.  Breast Cancer Surveillance: 1. Breast exam 10/19/2017: Benign, recently seroma was drained Multiple times by Dr. [name] 2. Mammogram and ultrasound 04/09/2017  No abnormalities. Postsurgical changes. Breast Density Category C  Return to clinic in 1 year for follow-up

## 2017-10-19 NOTE — Telephone Encounter (Signed)
Scheduled appt per 12/14 los - patient did not want avs or calendar.

## 2017-10-19 NOTE — Progress Notes (Signed)
Patient Care Team: [name], FNP as PCP - General (Family Medicine) Marchia Bond, MD as Consulting Physician (Orthopedic Surgery) Johnathan Hausen, MD as Consulting Physician (General Surgery) Druscilla Brownie, MD as Consulting Physician (Dermatology) Gus Height, MD as Consulting Physician (Obstetrics and Gynecology) Barbaraann Cao, OD as Referring Physician (Optometry) Sable Feil, MD as Consulting Physician (Gastroenterology) Alphonsa Overall, MD as Consulting Physician (General Surgery) Nicholas Lose, MD as Consulting Physician (Hematology and Oncology) Thea Silversmith, MD (Inactive) as Consulting Physician (Radiation Oncology) Mauro Kaufmann, RN as Registered Nurse Rockwell Germany, RN as Registered Nurse Holley Bouche, NP as Nurse Practitioner (Nurse Practitioner) Sylvan Cheese, NP as Nurse Practitioner (Hematology and Oncology)  DIAGNOSIS:  Encounter Diagnosis  Name Primary?  . Malignant neoplasm of lower-outer quadrant of left breast of female, estrogen receptor positive ([place])     SUMMARY OF ONCOLOGIC HISTORY:   Breast cancer of lower-outer quadrant of left female breast ([place])   10/14/2013 Mammogram    Left breast: new calcifications warranting further imaging      11/04/2013 Mammogram    Left breast: There is a 2 mm group of round calcifications in the LOQ posteriorly. No associated mass or architectural distortion. F/U in 6 months.      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      03/25/2015 Definitive Surgery    Left lumpectomy/SLNB [name]): Invasive ductal carcinoma, grade 2, measuring 2.1 cm, negative for LVID, HER2/neu repeated and remains negative (ratio 1.36), DCIS with necrosis and calcifications, 1 LN removed and negative for maliganacy (0/1)       03/25/2015 Pathologic Stage    Stage IIA: pT2 pN0      03/25/2015 Oncotype testing    Score 13 (8% ROR). No chemotherapy  [name]).      05/03/2015 - 06/08/2015 Radiation Therapy    Adjuvant RT [name]): Left breast 45 Gy over 25 fractions. Left breast boost 16 Gy over 8 fractions. Total dose 61 Gy      06/28/2015 -  Anti-estrogen oral therapy    Anastrozole 1 mg daily. Planned duration of therapy 5 years.       CHIEF COMPLIANT: Follow-up on anastrozole therapy  INTERVAL HISTORY: Amandalee Lacap is a 56 year old with above-mentioned history of left breast cancer treated with lumpectomy and radiation is currently on anastrozole and is having the same side effects as before in terms of hot flashes.  Several days it gets really bad and then gets better for some days before it comes back.  She takes Effexor and it appears to be helping her somewhat.  Denies any mood swings.  Denies any blood clots or any symptoms of leg swelling.  Complains of the seroma in the left breast intermittently giving her problems.  REVIEW OF SYSTEMS:   Constitutional: Denies fevers, chills or abnormal weight loss Eyes: Denies blurriness of vision  Ears, nose, mouth, throat, and face: Denies mucositis or sore throat Respiratory: Denies cough, dyspnea or wheezes Cardiovascular: Denies palpitation, chest discomfort Gastrointestinal:  Denies nausea, heartburn or change in bowel habits Skin: Denies abnormal skin rashes Lymphatics: Denies new lymphadenopathy or easy bruising Neurological:Denies numbness,  tingling or new weaknesses Behavioral/Psych: Mood is stable, no new changes  Extremities: No lower extremity edema Breast: Left breast seroma is palpable previously it had been drained All other systems were reviewed with the patient and are negative.  I have reviewed the past medical history, past surgical history, social history and family history with the patient and they are unchanged from previous note.  ALLERGIES:  is allergic to buprenex [buprenorphine].  MEDICATIONS:  Current Outpatient Medications  Medication Sig Dispense Refill  . acetaminophen (TYLENOL) 325 MG tablet Take 650 mg by mouth every 6 (six) hours as needed.    Marland Kitchen anastrozole (ARIMIDEX) 1 MG tablet TAKE 1 TABLET BY MOUTH EVERY DAY 90 tablet 2  . Calcium Carbonate-Vitamin D (CALCIUM-VITAMIN D) 500-200 MG-UNIT per tablet Take 1 tablet by mouth 2 (two) times daily.    . hydrocortisone (ANUSOL-HC) 2.5 % rectal cream Apply twice a day for 10 days 30 g 1  . triamcinolone ointment (KENALOG) 0.5 % Apply 1 application topically 2 (two) times daily. To affected area, avoid eyes and face 30 g 3  . venlafaxine XR (EFFEXOR-XR) 75 MG 24 hr capsule TAKE ONE CAPSULE BY MOUTH EVERY DAY WITH BREAKFAST 90 capsule 1   No current facility-administered medications for this visit.     PHYSICAL EXAMINATION: ECOG PERFORMANCE STATUS: 1 - Symptomatic but completely ambulatory  Vitals:   10/19/17 0819  BP: 117/69  Pulse: 69  Resp: 12  Temp: 97.9 F (36.6 C)  SpO2: 100%   Filed Weights   10/19/17 0819  Weight: 229 lb 4.8 oz (104 kg)    GENERAL:alert, no distress and comfortable SKIN: skin color, texture, turgor are normal, no rashes or significant lesions EYES: normal, Conjunctiva are pink and non-injected, sclera clear OROPHARYNX:no exudate, no erythema and lips, buccal mucosa, and tongue normal  NECK: supple, thyroid normal size, non-tender, without nodularity LYMPH:  no palpable lymphadenopathy in the cervical, axillary or  inguinal LUNGS: clear to auscultation and percussion with normal breathing effort HEART: regular rate & rhythm and no murmurs and no lower extremity edema ABDOMEN:abdomen soft, non-tender and normal bowel sounds MUSCULOSKELETAL:no cyanosis of digits and no clubbing  NEURO: alert & oriented x 3 with fluent speech, no focal motor/sensory deficits EXTREMITIES: No lower extremity edema BREAST: Palpable large seroma in the left breast.  No other palpable lumps or nodules of concern (exam performed in the presence of a chaperone)  LABORATORY DATA:  I have reviewed the data as listed   Chemistry      Component Value Date/Time   NA 142 12/19/2016   NA 142 03/17/2015 0833   K 4.4 12/19/2016   K 3.7 03/17/2015 0833   CL 105 07/30/2015 0732   CO2 26 07/30/2015 0732   CO2 24 03/17/2015 0833   BUN 13 12/19/2016   BUN 8.7 03/17/2015 0833   CREATININE 0.6 12/19/2016   CREATININE 0.60 07/30/2015 0732   CREATININE 0.7 03/17/2015 0833   GLU 87 12/19/2016      Component Value Date/Time   CALCIUM 9.6 07/30/2015 0732   CALCIUM 8.7 03/17/2015 0833   ALKPHOS 76 12/19/2016   ALKPHOS 65 03/17/2015 0833   AST 19 12/19/2016   AST 15 03/17/2015 2585  ALT 18 12/19/2016   ALT 17 03/17/2015 0833   BILITOT 0.5 07/30/2015 0732   BILITOT 0.46 03/17/2015 0833       Lab Results  Component Value Date   WBC 6.1 07/30/2015   HGB 12.6 12/19/2016   HCT 37 12/19/2016   MCV 85.0 07/30/2015   PLT 246 12/19/2016   NEUTROABS 5.0 03/17/2015    ASSESSMENT & PLAN:  Breast cancer of lower-outer quadrant of left female breast Left lumpectomy 03/25/2015: Invasive ductal carcinoma, 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 Hot flashes: Slightly better with increased dosage of Effexor. She is doing  much better 2. Muscle aches and pains: Patient has completed the [employer] live strong program and since then has lost 25 pounds and she exercises regularly and watches what she eats. She extremely happy about her current weight and continues to stay active. Because of this her muscle aches and pains have improved.  Breast Cancer Surveillance: 1. Breast exam 10/19/2017: Benign, seroma was drained Multiple times by Dr. [name] 2. Mammogram and ultrasound 04/09/2017  No abnormalities. Postsurgical changes. Breast Density Category C  Return to clinic in 1 year for follow-up  I spent 25 minutes talking to the patient of which more than half was spent in counseling and coordination of care.  No orders of the defined types were placed in this encounter.  The patient has a good understanding of the overall plan. she agrees with it. she will call with any problems that may develop before the next visit here.   Rulon Eisenmenger, MD 10/19/17

## 2017-10-20 ENCOUNTER — Other Ambulatory Visit: Payer: Self-pay | Admitting: Hematology and Oncology

## 2017-10-20 DIAGNOSIS — C50512 Malignant neoplasm of lower-outer quadrant of left female breast: Secondary | ICD-10-CM

## 2017-10-20 DIAGNOSIS — Z170 Estrogen receptor positive status [ER+]: Principal | ICD-10-CM

## 2017-12-25 DIAGNOSIS — Z0000 Encounter for general adult medical examination without abnormal findings: Secondary | ICD-10-CM | POA: Diagnosis not present

## 2017-12-25 DIAGNOSIS — Z01419 Encounter for gynecological examination (general) (routine) without abnormal findings: Secondary | ICD-10-CM | POA: Diagnosis not present

## 2017-12-25 DIAGNOSIS — Z6837 Body mass index (BMI) 37.0-37.9, adult: Secondary | ICD-10-CM | POA: Diagnosis not present

## 2017-12-25 DIAGNOSIS — Z124 Encounter for screening for malignant neoplasm of cervix: Secondary | ICD-10-CM | POA: Diagnosis not present

## 2017-12-26 ENCOUNTER — Other Ambulatory Visit: Payer: Self-pay

## 2018-01-14 ENCOUNTER — Other Ambulatory Visit: Payer: Self-pay | Admitting: Hematology and Oncology

## 2018-01-14 DIAGNOSIS — C50512 Malignant neoplasm of lower-outer quadrant of left female breast: Secondary | ICD-10-CM

## 2018-02-25 ENCOUNTER — Encounter: Payer: Self-pay | Admitting: Hematology and Oncology

## 2018-02-26 ENCOUNTER — Other Ambulatory Visit: Payer: Self-pay

## 2018-02-26 DIAGNOSIS — C50512 Malignant neoplasm of lower-outer quadrant of left female breast: Secondary | ICD-10-CM

## 2018-02-26 MED ORDER — VENLAFAXINE HCL ER 75 MG PO CP24: ORAL_CAPSULE | ORAL | 1 refills | Status: DC

## 2018-02-27 DIAGNOSIS — J029 Acute pharyngitis, unspecified: Secondary | ICD-10-CM | POA: Diagnosis not present

## 2018-02-27 DIAGNOSIS — J22 Unspecified acute lower respiratory infection: Secondary | ICD-10-CM | POA: Diagnosis not present

## 2018-02-27 DIAGNOSIS — R05 Cough: Secondary | ICD-10-CM | POA: Diagnosis not present

## 2018-04-11 ENCOUNTER — Ambulatory Visit
Admission: RE | Admit: 2018-04-11 | Discharge: 2018-04-11 | Disposition: A | Discharge status: Home / Self Care | Payer: BLUE CROSS/BLUE SHIELD | Source: Ambulatory Visit | Attending: Hematology and Oncology | Admitting: Hematology and Oncology

## 2018-04-11 DIAGNOSIS — Z170 Estrogen receptor positive status [ER+]: Principal | ICD-10-CM

## 2018-04-11 DIAGNOSIS — M810 Age-related osteoporosis without current pathological fracture: Secondary | ICD-10-CM | POA: Diagnosis not present

## 2018-04-11 DIAGNOSIS — C50512 Malignant neoplasm of lower-outer quadrant of left female breast: Secondary | ICD-10-CM

## 2018-04-11 DIAGNOSIS — R922 Inconclusive mammogram: Secondary | ICD-10-CM | POA: Diagnosis not present

## 2018-06-27 DIAGNOSIS — D229 Melanocytic nevi, unspecified: Secondary | ICD-10-CM | POA: Diagnosis not present

## 2018-06-27 DIAGNOSIS — L821 Other seborrheic keratosis: Secondary | ICD-10-CM | POA: Diagnosis not present

## 2018-06-27 DIAGNOSIS — L814 Other melanin hyperpigmentation: Secondary | ICD-10-CM | POA: Diagnosis not present

## 2018-06-27 DIAGNOSIS — D1801 Hemangioma of skin and subcutaneous tissue: Secondary | ICD-10-CM | POA: Diagnosis not present

## 2018-08-18 ENCOUNTER — Other Ambulatory Visit: Payer: Self-pay | Admitting: Hematology and Oncology

## 2018-08-18 DIAGNOSIS — C50512 Malignant neoplasm of lower-outer quadrant of left female breast: Secondary | ICD-10-CM

## 2018-10-20 NOTE — Assessment & Plan Note (Signed)
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 Hot flashes: Slightly better with increased dosage of Effexor. She is doing much better 2. Muscle aches and pains: Patient has completed the [employer] live strong program and since then has lost 25 pounds and she exercises regularly and watches what she eats. Because of this her muscle aches and pains have improved.  Breast Cancer Surveillance: 1. Breast exam12/16/2019: Benign, seroma was drainedMultiple times by Dr. [name] 2. Mammogram and ultrasound 04/11/2018  No abnormalities. Postsurgical changes. Breast Density Category C  Return to clinic in 1 year for follow-up

## 2018-10-21 ENCOUNTER — Inpatient Hospital Stay: Payer: BLUE CROSS/BLUE SHIELD | Attending: Hematology and Oncology | Admitting: Hematology and Oncology

## 2018-10-21 ENCOUNTER — Telehealth: Payer: Self-pay | Admitting: Hematology and Oncology

## 2018-10-21 DIAGNOSIS — Z923 Personal history of irradiation: Secondary | ICD-10-CM | POA: Diagnosis not present

## 2018-10-21 DIAGNOSIS — Z170 Estrogen receptor positive status [ER+]: Secondary | ICD-10-CM | POA: Insufficient documentation

## 2018-10-21 DIAGNOSIS — R232 Flushing: Secondary | ICD-10-CM | POA: Insufficient documentation

## 2018-10-21 DIAGNOSIS — C50512 Malignant neoplasm of lower-outer quadrant of left female breast: Secondary | ICD-10-CM | POA: Diagnosis not present

## 2018-10-21 DIAGNOSIS — M791 Myalgia, unspecified site: Secondary | ICD-10-CM | POA: Diagnosis not present

## 2018-10-21 DIAGNOSIS — Z79811 Long term (current) use of aromatase inhibitors: Secondary | ICD-10-CM | POA: Diagnosis not present

## 2018-10-21 DIAGNOSIS — Z79899 Other long term (current) drug therapy: Secondary | ICD-10-CM | POA: Diagnosis not present

## 2018-10-21 MED ORDER — ANASTROZOLE 1 MG PO TABS: 1.0000 mg | ORAL_TABLET | Freq: Every day | ORAL | 3 refills | Status: DC

## 2018-10-21 MED ORDER — VENLAFAXINE HCL ER 75 MG PO CP24: ORAL_CAPSULE | ORAL | 3 refills | Status: DC

## 2018-10-21 NOTE — Telephone Encounter (Signed)
Gave avs and calendar ° °

## 2018-10-21 NOTE — Progress Notes (Signed)
Patient Care Team: Patient, No Pcp Per as PCP - General (General Practice) Marchia Bond, MD as Consulting Physician (Orthopedic Surgery) Johnathan Hausen, MD as Consulting Physician (General Surgery) Druscilla Brownie, MD as Consulting Physician (Dermatology) Gus Height, MD as Consulting Physician (Obstetrics and Gynecology) Barbaraann Cao, OD as Referring Physician (Optometry) Sable Feil, MD as Consulting Physician (Gastroenterology) Alphonsa Overall, MD as Consulting Physician (General Surgery) Nicholas Lose, MD as Consulting Physician (Hematology and Oncology) Thea Silversmith, MD as Consulting Physician (Radiation Oncology) Mauro Kaufmann, RN as Registered Nurse Rockwell Germany, RN as Registered Nurse Holley Bouche, NP (Inactive) as Nurse Practitioner (Nurse Practitioner) Sylvan Cheese, NP as Nurse Practitioner (Hematology and Oncology)  DIAGNOSIS:  Encounter Diagnosis  Name Primary?  . Malignant neoplasm of lower-outer quadrant of left breast of female, estrogen receptor positive ([place])     SUMMARY OF ONCOLOGIC HISTORY:   Breast cancer of lower-outer quadrant of left female breast ([place])   10/14/2013 Mammogram    Left breast: new calcifications warranting further imaging    11/04/2013 Mammogram    Left breast: There is a 2 mm group of round calcifications in the LOQ posteriorly. No associated mass or architectural distortion. F/U in 6 months.    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    03/25/2015 Definitive Surgery    Left lumpectomy/[facility] [name]): Invasive ductal carcinoma, grade 2, measuring 2.1 cm, negative for LVID, HER2/neu repeated and remains negative (ratio 1.36), DCIS with necrosis and calcifications, 1 LN removed and negative for maliganacy (0/1)     03/25/2015 Pathologic Stage    Stage IIA: pT2 pN0    03/25/2015 Oncotype testing    Score 13 (8% ROR). No chemotherapy  [name]).    05/03/2015 - 06/08/2015 Radiation Therapy    Adjuvant RT [name]): Left breast 45 Gy over 25 fractions. Left breast boost 16 Gy over 8 fractions. Total dose 61 Gy    06/28/2015 -  Anti-estrogen oral therapy    Anastrozole 1 mg daily. Planned duration of therapy 5 years.     CHIEF COMPLIANT: Follow-up on anastrozole therapy  INTERVAL HISTORY: Wanda Moore is a 14-year with above-mentioned history of left breast cancer treated with lumpectomy adjuvant radiation and is now on antiestrogen therapy with anastrozole.  She is tolerating it fairly well.  She continues to have hot flashes but she is able to manage them with Effexor.  She also has muscle aches and pains but they are not as bad as before.  She has gained some of the weight back and is planning to lose it again.  [place] have been eating out a lot more than before.  REVIEW OF SYSTEMS:   Constitutional: Denies fevers, chills or abnormal weight loss Eyes: Denies blurriness of vision Ears, nose, mouth, throat, and face: Denies mucositis or sore throat Respiratory: Denies cough, dyspnea or  wheezes Cardiovascular: Denies palpitation, chest discomfort Gastrointestinal:  Denies nausea, heartburn or change in bowel habits Skin: Denies abnormal skin rashes Lymphatics: Denies new lymphadenopathy or easy bruising Neurological:Denies numbness, tingling or new weaknesses Behavioral/Psych:  Mood is stable, no new changes  Extremities: No lower extremity edema Breast: Seroma in the left breast All other systems were reviewed with the patient and are negative.  I have reviewed the past medical history, past surgical history, social history and family history with the patient and they are unchanged from previous note.  ALLERGIES:  is allergic to buprenex [buprenorphine].  MEDICATIONS:  Current Outpatient Medications  Medication Sig Dispense Refill  . anastrozole (ARIMIDEX) 1 MG tablet Take 1 tablet (1 mg total) by mouth daily. 90 tablet 3  . Calcium Carbonate-Vitamin D (CALCIUM-VITAMIN D) 500-200 MG-UNIT per tablet Take 1 tablet by mouth 2 (two) times daily.    Marland Kitchen venlafaxine XR (EFFEXOR-XR) 75 MG 24 hr capsule TAKE 1 CAPSULE BY MOUTH EVERY DAY WITH BREAKFAST 90 capsule 0   No current facility-administered medications for this visit.     PHYSICAL EXAMINATION: ECOG PERFORMANCE STATUS: 1 - Symptomatic but completely ambulatory  Vitals:   10/21/18 0832  BP: (!) 148/79  Pulse: 68  Resp: 17  Temp: 97.8 F (36.6 C)  SpO2: 100%   Filed Weights   10/21/18 0832  Weight: 238 lb (108 kg)    GENERAL:alert, no distress and comfortable SKIN: skin color, texture, turgor are normal, no rashes or significant lesions EYES: normal, Conjunctiva are pink and non-injected, sclera clear OROPHARYNX:no exudate, no erythema and lips, buccal mucosa, and tongue normal  NECK: supple, thyroid normal size, non-tender, without nodularity LYMPH:  no palpable lymphadenopathy in the cervical, axillary or inguinal LUNGS: clear to auscultation and percussion with normal breathing effort HEART: regular rate & rhythm and no murmurs and no lower extremity edema ABDOMEN:abdomen soft, non-tender and normal bowel sounds MUSCULOSKELETAL:no cyanosis of digits and no clubbing  NEURO: alert & oriented x 3 with fluent speech, no focal motor/sensory deficits EXTREMITIES: No lower extremity edema BREAST:  Seroma is palpable in the left breast. (exam performed in the presence of a chaperone)  LABORATORY DATA:  I have reviewed the data as listed CMP Latest Ref Rng & Units 12/19/2016 07/30/2015 03/17/2015  Glucose 70 - 99 mg/dL - 95 129  BUN 4 - 21 mg/dL 13 13 8.7  Creatinine 0.5 - 1.1 mg/dL 0.6 0.60 0.7  Sodium 137 - 147 mmol/L 142 141 142  Potassium 3.4 - 5.3 mmol/L 4.4 4.0 3.7  Chloride 96 - 112 mEq/L - 105 -  CO2 19 - 32 mEq/L - 26 24  Calcium 8.4 - 10.5 mg/dL - 9.6 8.7  Total Protein 6.0 - 8.3 g/dL - 7.4 6.6  Total Bilirubin 0.2 - 1.2 mg/dL - 0.5 0.46  Alkaline Phos 25 - 125 U/L 76 66 65  AST 13 - 35 U/L _0 ALT 7 - 35 U/L 18 45(H) 17    Lab Results  Component Value Date   WBC 6.1 07/30/2015   HGB 12.6 12/19/2016   HCT 37 12/19/2016   MCV 85.0 07/30/2015   PLT 246 12/19/2016   NEUTROABS 5.0 03/17/2015    ASSESSMENT & PLAN:  Breast cancer of lower-outer quadrant of left female breast Left lumpectomy 03/25/2015: Invasive ductal carcinoma, negative for LVID, DCIS with necrosis and calcifications, 0/1 lymph node, grade 2, 2.1 cm, T2 N0 M0 stage II a, Oncotype DX 13; 8% ROR status post adjuvant radiation  started 05/01/2015 started anastrozole 1 mg daily 06/28/2015  Anastrozole toxicities:  1. Hot flashes: Slightly better with increased dosage of Effexor. She is doing much better 2. Muscle aches and pains: Patient has completed the [employer] live strong program and since then has lost 25 pounds and she exercises regularly and watches what she eats. Because of this her muscle aches and pains have improved.  Breast Cancer Surveillance: 1. Breast exam12/16/2019: Benign, seroma was drainedMultiple times by Dr. [name], patient still has a large seroma but it is not bothering her 2. Mammogram and ultrasound 04/11/2018  No abnormalities. Postsurgical changes. Breast Density Category C  Return to clinic in 1 year for follow-up    No orders of the defined types were placed in this  encounter.  The patient has a good understanding of the overall plan. she agrees with it. she will call with any problems that may develop before the next visit here.   Harriette Ohara, MD 10/21/18

## 2018-12-10 ENCOUNTER — Ambulatory Visit (INDEPENDENT_AMBULATORY_CARE_PROVIDER_SITE_OTHER): Payer: BLUE CROSS/BLUE SHIELD | Admitting: Gastroenterology

## 2018-12-10 ENCOUNTER — Encounter: Payer: Self-pay | Admitting: Gastroenterology

## 2018-12-10 VITALS — BP 130/76 | HR 74 | Ht 72.0 in | Wt 231.1 lb

## 2018-12-10 DIAGNOSIS — Z9889 Other specified postprocedural states: Secondary | ICD-10-CM | POA: Diagnosis not present

## 2018-12-10 DIAGNOSIS — R1319 Other dysphagia: Secondary | ICD-10-CM

## 2018-12-10 DIAGNOSIS — R1310 Dysphagia, unspecified: Secondary | ICD-10-CM | POA: Diagnosis not present

## 2018-12-10 NOTE — Patient Instructions (Signed)
You have been scheduled for an endoscopy. Please follow written instructions given to you at your visit today. If you use inhalers (even only as needed), please bring them with you on the day of your procedure. Your physician has requested that you go to www.startemmi.com and enter the access code given to you at your visit today. This web site gives a general overview about your procedure. However, you should still follow specific instructions given to you by our office regarding your preparation for the procedure.  Normal BMI (Body Mass Index- based on height and weight) is between 19 and 25. Your BMI today is Body mass index is 31.35 kg/m. Marland Kitchen Please consider follow up  regarding your BMI with your Primary Care Provider.  Thank you for choosing me and [facility].  Pricilla Riffle. Dagoberto Ligas., MD., Marval Regal

## 2018-12-10 NOTE — Progress Notes (Signed)
History of Present Illness: This is a 58 year old female with Barrett's noted in 2008 however not noted on subsequent EGDs with progressive solid food dysphagia for 1 year.  Laparoscopic Nissen fundoplication in 7989. XRT in 2016 for breast cancer. No reflux symptoms.  She has had progressively worsening difficulty swallowing solids initially with rice and then with breads and meats.  Symptoms have steadily worsened.  No other gastrointestinal symptoms. Denies weight loss, abdominal pain, constipation, diarrhea, change in stool caliber, melena, hematochezia, nausea, vomiting, reflux symptoms, chest pain.    Allergies  Allergen Reactions  . Buprenex [Buprenorphine] Itching   Outpatient Medications Prior to Visit  Medication Sig Dispense Refill  . anastrozole (ARIMIDEX) 1 MG tablet Take 1 tablet (1 mg total) by mouth daily. 90 tablet 3  . Calcium Carbonate-Vitamin D (CALCIUM-VITAMIN D) 500-200 MG-UNIT per tablet Take 1 tablet by mouth 2 (two) times daily.    Marland Kitchen venlafaxine XR (EFFEXOR-XR) 75 MG 24 hr capsule TAKE 1 CAPSULE BY MOUTH EVERY DAY WITH BREAKFAST 90 capsule 3   No facility-administered medications prior to visit.    Past Medical History:  Diagnosis Date  . Allergy   . Anxiety   . Barrett esophagus 2008  . Breast cancer ([facility])   . Breast cancer of lower-outer quadrant of left female breast ([place]) 03/15/2015  . Cough 08-03-2012    nonproductive  . Distal radius fracture, left 12/05/2012  . Diverticulosis of colon (without mention of hemorrhage)   . Esophageal reflux   . Hiatal hernia 2013   large  . Melanoma ([place])    removed from betwen shoulder blades  . Personal history of radiation therapy 2016  . Recurrent UTI   . Stricture and stenosis of esophagus 2013   Past Surgical History:  Procedure Laterality Date  . BREAST LUMPECTOMY Left 2016  . BREAST LUMPECTOMY WITH RADIOACTIVE SEED AND SENTINEL LYMPH NODE BIOPSY Left 03/25/2015   Procedure: LEFT BREAST LUMPECTOMY WITH  RADIOACTIVE SEED AND LEFT AXILLARY SENTINEL LYMPH NODE BIOPSY;  Surgeon: Alphonsa Overall, MD;  Location: [facility];  Service: General;  Laterality: Left;  . CESAREAN SECTION  1992  . CHOLECYSTECTOMY    . COLONOSCOPY  03/25/2007   Diverticulosis   . ESOPHAGOGASTRODUODENOSCOPY  04/10/2012   HH, and Stricture  . LAPAROSCOPIC NISSEN FUNDOPLICATION  21/11/9415   Procedure: LAPAROSCOPIC NISSEN FUNDOPLICATION;  Surgeon: Pedro Earls, MD;  Location: [facility];  Service: General;  Laterality: N/A;  HIATAL HERNIA REPAIR  . MELANOMA EXCISION     back  . OPEN REDUCTION INTERNAL FIXATION (ORIF) DISTAL RADIAL FRACTURE  12/05/2012   Procedure: OPEN REDUCTION INTERNAL FIXATION (ORIF) DISTAL RADIAL FRACTURE;  Surgeon: Johnny Bridge, MD;  Location: [facility];  Service: Orthopedics;  Laterality: Left;   Social History   Socioeconomic History  . Marital status: Married    Spouse name: Not on file  . Number of children: 2  . Years of education: 71  . Highest education level: Not on file  Occupational History  . Occupation: adm Clinical cytogeneticist: [employer]  Social Needs  . Financial resource strain: Not on file  . Food insecurity:    Worry: Not on file    Inability: Not on file  . Transportation needs:    Medical: Not on file    Non-medical: Not on file  Tobacco Use  . Smoking status: Never Smoker  . Smokeless tobacco: Never Used  Substance and Sexual Activity  .  Alcohol use: Yes    Alcohol/week: 0.0 standard drinks    Comment: socially  . Drug use: No  . Sexual activity: Yes  Lifestyle  . Physical activity:    Days per week: Not on file    Minutes per session: Not on file  . Stress: Not on file  Relationships  . Social connections:    Talks on phone: Not on file    Gets together: Not on file    Attends religious service: Not on file    Active member of club or organization: Not on file    Attends meetings of clubs or organizations: Not on  file    Relationship status: Not on file  Other Topics Concern  . Not on file  Social History Narrative   HSG, [employer] - 2 years. Married 1990. 2 dtrs - '92, '94. Marriage in good health.   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   Feels safe at home and denies abuse            Family History  Problem Relation Age of Onset  . Hypertension Mother   . Diabetes Mother   . Heart disease Mother   . Lung cancer Father   . Other Father        father was adopted - no paternal family history information  . Diabetes Maternal Grandmother   . Breast cancer Sister   . Cancer Sister 63       breast (half sister - same mother)  . Colon cancer Neg Hx        Physical Exam: General: Well developed, well nourished, no acute distress Head: Normocephalic and atraumatic Eyes:  sclerae anicteric, EOMI Ears: Normal auditory acuity Mouth: No deformity or lesions Lungs: Clear throughout to auscultation Heart: Regular rate and rhythm; no murmurs, rubs or bruits Abdomen: Soft, non tender and non distended. No masses, hepatosplenomegaly or hernias noted. Normal Bowel sounds Rectal: Not done Musculoskeletal: Symmetrical with no gross deformities  Pulses:  Normal pulses noted Extremities: No clubbing, cyanosis, edema or deformities noted Neurological: Alert oriented x 4, grossly nonfocal Psychological:  Alert and cooperative. Normal mood and affect   Assessment and Recommendations:  1. Dysphagia, progressive to solid foods. Barrett's diagnosed in 2008 but not noted on subsequent EGDs.  Status post laparoscopic Nissen fundoplication in 2774.  Rule out stricture, esophagitis, neoplasm.  Schedule EGD with possible dilation. The risks (including bleeding, perforation, infection, missed lesions, medication reactions and possible hospitalization or surgery if complications occur), benefits, and alternatives to  endoscopy with possible biopsy and possible dilation were discussed with the patient and they consent to proceed.

## 2018-12-11 ENCOUNTER — Encounter: Payer: Self-pay | Admitting: Hematology and Oncology

## 2018-12-11 ENCOUNTER — Other Ambulatory Visit: Payer: Self-pay

## 2018-12-11 DIAGNOSIS — C50512 Malignant neoplasm of lower-outer quadrant of left female breast: Secondary | ICD-10-CM

## 2018-12-11 MED ORDER — ANASTROZOLE 1 MG PO TABS: 1.0000 mg | ORAL_TABLET | Freq: Every day | ORAL | 3 refills | Status: DC

## 2018-12-11 MED ORDER — VENLAFAXINE HCL ER 75 MG PO CP24: ORAL_CAPSULE | ORAL | 3 refills | Status: DC

## 2018-12-18 ENCOUNTER — Ambulatory Visit (AMBULATORY_SURGERY_CENTER): Payer: BLUE CROSS/BLUE SHIELD | Admitting: Gastroenterology

## 2018-12-18 ENCOUNTER — Encounter: Payer: Self-pay | Admitting: Gastroenterology

## 2018-12-18 VITALS — BP 131/74 | HR 63 | Temp 97.3°F | Resp 11 | Ht 72.0 in | Wt 231.0 lb

## 2018-12-18 DIAGNOSIS — K3189 Other diseases of stomach and duodenum: Secondary | ICD-10-CM | POA: Diagnosis not present

## 2018-12-18 DIAGNOSIS — R1310 Dysphagia, unspecified: Secondary | ICD-10-CM | POA: Diagnosis not present

## 2018-12-18 DIAGNOSIS — K2970 Gastritis, unspecified, without bleeding: Secondary | ICD-10-CM

## 2018-12-18 DIAGNOSIS — K259 Gastric ulcer, unspecified as acute or chronic, without hemorrhage or perforation: Secondary | ICD-10-CM

## 2018-12-18 DIAGNOSIS — K2100 Gastro-esophageal reflux disease with esophagitis, without bleeding: Secondary | ICD-10-CM

## 2018-12-18 DIAGNOSIS — K2950 Unspecified chronic gastritis without bleeding: Secondary | ICD-10-CM | POA: Diagnosis not present

## 2018-12-18 MED ORDER — PANTOPRAZOLE SODIUM 40 MG PO TBEC: 40.0000 mg | DELAYED_RELEASE_TABLET | Freq: Every day | ORAL | 4 refills | Status: DC

## 2018-12-18 MED ORDER — SODIUM CHLORIDE 0.9 % IV SOLN: 500.0000 mL | Freq: Once | INTRAVENOUS | Status: DC

## 2018-12-18 NOTE — Patient Instructions (Signed)
Information on dilation diet and gastritis given to you today.  Await pathology results.  Clear liquid diet until 11:15 today, then soft diet until tomorrow. See handout.  Return to GI clinic in 6 weeks.  Protonix 40 mg by mouth every day for 1 year.  YOU HAD AN ENDOSCOPIC PROCEDURE TODAY AT [facility]:   Refer to the procedure report that was given to you for any specific questions about what was found during the examination.  If the procedure report does not answer your questions, please call your gastroenterologist to clarify.  If you requested that your care partner not be given the details of your procedure findings, then the procedure report has been included in a sealed envelope for you to review at your convenience later.  YOU SHOULD EXPECT: Some feelings of bloating in the abdomen. Passage of more gas than usual.  Walking can help get rid of the air that was put into your GI tract during the procedure and reduce the bloating. If you had a lower endoscopy (such as a colonoscopy or flexible sigmoidoscopy) you may notice spotting of blood in your stool or on the toilet paper. If you underwent a bowel prep for your procedure, you may not have a normal bowel movement for a few days.  Please Note:  You might notice some irritation and congestion in your nose or some drainage.  This is from the oxygen used during your procedure.  There is no need for concern and it should clear up in a day or so.  SYMPTOMS TO REPORT IMMEDIATELY:   Following upper endoscopy (EGD)  Vomiting of blood or coffee ground material  New chest pain or pain under the shoulder blades  Painful or persistently difficult swallowing  New shortness of breath  Fever of 100F or higher  Black, tarry-looking stools  For urgent or emergent issues, a gastroenterologist can be reached at any hour by calling [phone].   DIET:  We do recommend a small meal at first, but then you may proceed to your  regular diet.  Drink plenty of fluids but you should avoid alcoholic beverages for 24 hours.  ACTIVITY:  You should plan to take it easy for the rest of today and you should NOT DRIVE or use heavy machinery until tomorrow (because of the sedation medicines used during the test).    FOLLOW UP: Our staff will call the number listed on your records the next business day following your procedure to check on you and address any questions or concerns that you may have regarding the information given to you following your procedure. If we do not reach you, we will leave a message.  However, if you are feeling well and you are not experiencing any problems, there is no need to return our call.  We will assume that you have returned to your regular daily activities without incident.  If any biopsies were taken you will be contacted by phone or by letter within the next 1-3 weeks.  Please call us at [phone] if you have not heard about the biopsies in 3 weeks.    SIGNATURES/CONFIDENTIALITY: You and/or your care partner have signed paperwork which will be entered into your electronic medical record.  These signatures attest to the fact that that the information above on your After Visit Summary has been reviewed and is understood.  Full responsibility of the confidentiality of this discharge information lies with you and/or your care-partner.

## 2018-12-18 NOTE — Op Note (Signed)
[facility] Patient Name: Wanda Moore Procedure Date: 12/18/2018 8:58 AM MRN: [id number] Endoscopist: Ladene Artist , MD Age: 58 Referring MD:  Date of Birth: 16-May-1961 Gender: Female Account #: [id number] Procedure:                Upper GI endoscopy Indications:              Dysphagia Medicines:                Monitored Anesthesia Care Procedure:                Pre-Anesthesia Assessment:                           - Prior to the procedure, a History and Physical                            was performed, and patient medications and                            allergies were reviewed. The patient's tolerance of                            previous anesthesia was also reviewed. The risks                            and benefits of the procedure and the sedation                            options and risks were discussed with the patient.                            All questions were answered, and informed consent                            was obtained. Prior Anticoagulants: The patient has                            taken no previous anticoagulant or antiplatelet                            agents. ASA Grade Assessment: II - A patient with                            mild systemic disease. After reviewing the risks                            and benefits, the patient was deemed in                            satisfactory condition to undergo the procedure.                           After obtaining informed consent, the endoscope was  passed under direct vision. Throughout the                            procedure, the patient's blood pressure, pulse, and                            oxygen saturations were monitored continuously. The                            Endoscope was introduced through the mouth, and                            advanced to the second part of duodenum. The upper                            GI endoscopy was accomplished without  difficulty.                            The patient tolerated the procedure well. Scope In: Scope Out: Findings:                 LA Grade A (one or more mucosal breaks less than 5                            mm, not extending between tops of 2 mucosal folds)                            esophagitis with no bleeding was found at the                            gastroesophageal junction.                           No endoscopic abnormality was evident in the                            esophagus to explain the patient's complaint of                            dysphagia. It was decided, however, to proceed with                            dilation of the entire esophagus. A guidewire was                            placed and the scope was withdrawn. Dilation was                            performed with a Savary dilator with no resistance,                            no heme at 17 mm.  Evidence of a Nissen fundoplication was found in                            the cardia and in the gastric fundus. The wrap                            appeared intact. This was traversed.                           A few dispersed, small non-bleeding erosions were                            found in the gastric antrum. There were no stigmata                            of recent bleeding. Biopsies were taken with a cold                            forceps for histology.                           The exam of the stomach was otherwise normal.                           The duodenal bulb and second portion of the                            duodenum were normal. Complications:            No immediate complications. Estimated Blood Loss:     Estimated blood loss was minimal. Impression:               - LA Grade A reflux esophagitis.                           - No endoscopic esophageal abnormality to explain                            patient's dysphagia. Esophagus dilated. Dilated.                            - A Nissen fundoplication was found. The wrap                            appears intact.                           - Non-bleeding erosive gastropathy. Biopsied.                           - Normal duodenal bulb and second portion of the                            duodenum. Recommendation:           - Patient has a contact number available for  emergencies. The signs and symptoms of potential                            delayed complications were discussed with the                            patient. Return to normal activities tomorrow.                            Written discharge instructions were provided to the                            patient.                           - Clear liquid diet for 2 hours, then advance as                            tolerated to soft diet today.                           - Continue present medications.                           - Await pathology results.                           - Return to GI office in 6 weeks.                           - Protonix (pantoprazole) 40 mg PO daily, 1 year of                            refills. Ladene Artist, MD 12/18/2018 9:19:13 AM This report has been signed electronically.

## 2018-12-18 NOTE — Progress Notes (Signed)
I have reviewed the patient's medical history in detail and updated the computerized patient record.

## 2018-12-18 NOTE — Progress Notes (Signed)
Called to room to assist during endoscopic procedure.  Patient ID and intended procedure confirmed with present staff. Received instructions for my participation in the procedure from the performing physician.  

## 2018-12-18 NOTE — Progress Notes (Signed)
Report given to PACU, vss 

## 2018-12-19 ENCOUNTER — Telehealth: Payer: Self-pay | Admitting: *Deleted

## 2018-12-19 NOTE — Telephone Encounter (Signed)
  Follow up Call-  Call back number 12/18/2018 05/05/2016  Post procedure Call Back phone  # [phone]  Permission to leave phone message Yes Yes  Some recent data might be hidden     Patient questions:  Do you have a fever, pain , or abdominal swelling? No. Pain Score  0 *  Have you tolerated food without any problems? Yes.    Have you been able to return to your normal activities? Yes.    Do you have any questions about your discharge instructions: Diet   No. Medications  No. Follow up visit  No.  Do you have questions or concerns about your Care? No.  Actions: * If pain score is 4 or above: No action needed, pain <4.

## 2018-12-30 DIAGNOSIS — Z0000 Encounter for general adult medical examination without abnormal findings: Secondary | ICD-10-CM | POA: Diagnosis not present

## 2018-12-30 DIAGNOSIS — Z6831 Body mass index (BMI) 31.0-31.9, adult: Secondary | ICD-10-CM | POA: Diagnosis not present

## 2018-12-30 DIAGNOSIS — Z01419 Encounter for gynecological examination (general) (routine) without abnormal findings: Secondary | ICD-10-CM | POA: Diagnosis not present

## 2019-01-09 ENCOUNTER — Encounter: Payer: Self-pay | Admitting: Gastroenterology

## 2019-02-11 ENCOUNTER — Encounter: Payer: Self-pay | Admitting: Hematology and Oncology

## 2019-03-25 ENCOUNTER — Other Ambulatory Visit: Payer: Self-pay | Admitting: Hematology and Oncology

## 2019-03-25 DIAGNOSIS — Z853 Personal history of malignant neoplasm of breast: Secondary | ICD-10-CM

## 2019-04-15 ENCOUNTER — Ambulatory Visit
Admission: RE | Admit: 2019-04-15 | Discharge: 2019-04-15 | Disposition: A | Discharge status: Home / Self Care | Payer: BLUE CROSS/BLUE SHIELD | Source: Ambulatory Visit | Attending: Hematology and Oncology | Admitting: Hematology and Oncology

## 2019-04-15 ENCOUNTER — Other Ambulatory Visit: Payer: Self-pay

## 2019-04-15 DIAGNOSIS — Z853 Personal history of malignant neoplasm of breast: Secondary | ICD-10-CM | POA: Diagnosis not present

## 2019-04-15 DIAGNOSIS — R922 Inconclusive mammogram: Secondary | ICD-10-CM | POA: Diagnosis not present

## 2019-05-06 ENCOUNTER — Encounter: Payer: Self-pay | Admitting: Hematology and Oncology

## 2019-06-05 ENCOUNTER — Other Ambulatory Visit: Payer: Self-pay

## 2019-06-05 DIAGNOSIS — Z20822 Contact with and (suspected) exposure to covid-19: Secondary | ICD-10-CM

## 2019-06-07 LAB — NOVEL CORONAVIRUS, NAA: SARS-CoV-2, NAA: NOT DETECTED

## 2019-10-21 NOTE — Progress Notes (Signed)
Patient Care Team: Patient, No Pcp Per as PCP - General (General Practice) Marchia Bond, MD as Consulting Physician (Orthopedic Surgery) Johnathan Hausen, MD as Consulting Physician (General Surgery) Druscilla Brownie, MD as Consulting Physician (Dermatology) Gus Height, MD (Inactive) as Consulting Physician (Obstetrics and Gynecology) Barbaraann Cao, OD as Referring Physician (Optometry) Sable Feil, MD as Consulting Physician (Gastroenterology) Alphonsa Overall, MD as Consulting Physician (General Surgery) Nicholas Lose, MD as Consulting Physician (Hematology and Oncology) Thea Silversmith, MD as Consulting Physician (Radiation Oncology) Mauro Kaufmann, RN as Registered Nurse Rockwell Germany, RN as Registered Nurse Holley Bouche, NP (Inactive) as Nurse Practitioner (Nurse Practitioner) Sylvan Cheese, NP as Nurse Practitioner (Hematology and Oncology)  DIAGNOSIS:    ICD-10-CM   1. Malignant neoplasm of lower-outer quadrant of left breast of female, estrogen receptor positive ([facility])  C50.512    Z17.0     SUMMARY OF ONCOLOGIC HISTORY: Oncology History  Breast cancer of lower-outer quadrant of left female breast ([facility])  10/14/2013 Mammogram   Left breast: new calcifications warranting further imaging   11/04/2013 Mammogram   Left breast: There is a 2 mm group of round calcifications in the LOQ posteriorly. No associated mass or architectural distortion. F/U in 6 months.   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   03/25/2015 Definitive Surgery   Left lumpectomy/[facility] [name]): Invasive ductal carcinoma, grade 2, measuring 2.1 cm, negative for LVID, HER2/neu repeated and remains negative (ratio 1.36), DCIS with necrosis and calcifications, 1 LN removed and negative for maliganacy (0/1)    03/25/2015 Pathologic Stage   Stage IIA: pT2 pN0   03/25/2015 Oncotype testing   Score 13 (8% ROR). No chemotherapy  [name]).   05/03/2015 - 06/08/2015 Radiation Therapy   Adjuvant RT [name]): Left breast 45 Gy over 25 fractions. Left breast boost 16 Gy over 8 fractions. Total dose 61 Gy   06/28/2015 -  Anti-estrogen oral therapy   Anastrozole 1 mg daily. Planned duration of therapy 5 years.     CHIEF COMPLIANT: Follow-up of left breast cancer on anastrozole therapy  INTERVAL HISTORY: Wanda Moore is a 58 y.o. with above-mentioned history of left breast cancer treated with lumpectomy, adjuvant radiation, and who is currently on antiestrogen therapy with anastrozole. Mammogram on 04/15/19 showed no evidence of malignancy bilaterally. She presents to the clinic today for annual follow-up.  Apart from her seroma in the left breast causing intermittent pain and there is no other symptoms.  She is doing quite well.   REVIEW OF SYSTEMS:   Constitutional: Denies fevers, chills or abnormal weight loss Eyes: Denies blurriness of vision Ears, nose, mouth, throat, and face: Denies mucositis or sore throat Respiratory: Denies cough, dyspnea or wheezes Cardiovascular: Denies palpitation, chest discomfort Gastrointestinal: Denies nausea, heartburn or change in bowel habits Skin: Denies abnormal skin rashes Lymphatics: Denies new lymphadenopathy or easy bruising Neurological: Denies numbness, tingling or new weaknesses Behavioral/Psych: Mood is stable, no new  changes  Extremities: No lower  extremity edema Breast: denies any pain or lumps or nodules in either breasts All other systems were reviewed with the patient and are negative.  I have reviewed the past medical history, past surgical history, social history and family history with the patient and they are unchanged from previous note.  ALLERGIES:  is allergic to buprenex [buprenorphine] and pseudoephedrine hcl.  MEDICATIONS:  Current Outpatient Medications  Medication Sig Dispense Refill  . anastrozole (ARIMIDEX) 1 MG tablet Take 1 tablet (1 mg total) by mouth daily. 90 tablet 3  . Calcium Carbonate-Vitamin D (CALCIUM-VITAMIN D) 500-200 MG-UNIT per tablet Take 1 tablet by mouth 2 (two) times daily.    . pantoprazole (PROTONIX) 40 MG tablet Take 1 tablet (40 mg total) by mouth daily. 90 tablet 4  . venlafaxine XR (EFFEXOR-XR) 75 MG 24 hr capsule TAKE 1 CAPSULE BY MOUTH EVERY DAY WITH BREAKFAST 90 capsule 3   No current facility-administered medications for this visit.    PHYSICAL EXAMINATION: ECOG PERFORMANCE STATUS: 1 - Symptomatic but completely ambulatory  Vitals:   10/22/19 0828  BP: 130/83  Pulse: 67  Resp: 18  Temp: 98.4 F (36.9 C)  SpO2: 97%   Filed Weights   10/22/19 0828  Weight: 225 lb 3.2 oz (102.2 kg)    GENERAL: alert, no distress and comfortable SKIN: skin color, texture, turgor are normal, no rashes or significant lesions EYES: normal, Conjunctiva are pink and non-injected, sclera clear OROPHARYNX: no exudate, no erythema and lips, buccal mucosa, and tongue normal  NECK: supple, thyroid normal size, non-tender, without nodularity LYMPH: no palpable lymphadenopathy in the cervical, axillary or inguinal LUNGS: clear to auscultation and percussion with normal breathing effort HEART: regular rate & rhythm and no murmurs and no lower extremity edema ABDOMEN: abdomen soft, non-tender and normal bowel sounds MUSCULOSKELETAL: no cyanosis of digits and no clubbing  NEURO: alert & oriented x 3  with fluent speech, no focal motor/sensory deficits EXTREMITIES: No lower extremity edema BREAST: Left breast extremely large seroma is palpable.  No lumps or nodules in the right breast or bilateral axilla.. (exam performed in the presence of a chaperone)  LABORATORY DATA:  I have reviewed the data as listed CMP Latest Ref Rng & Units 12/19/2016 07/30/2015 03/17/2015  Glucose 70 - 99 mg/dL - 95 129  BUN 4 - 21 mg/dL 13 13 8.7  Creatinine 0.5 - 1.1 mg/dL 0.6 0.60 0.7  Sodium 137 - 147 mmol/L 142 141 142  Potassium 3.4 - 5.3 mmol/L 4.4 4.0 3.7  Chloride 96 - 112 mEq/L - 105 -  CO2 19 - 32 mEq/L - 26 24  Calcium 8.4 - 10.5 mg/dL - 9.6 8.7  Total Protein 6.0 - 8.3 g/dL - 7.4 6.6  Total Bilirubin 0.2 - 1.2 mg/dL - 0.5 0.46  Alkaline Phos 25 - 125 U/L 76 66 65  AST 13 - 35 U/L '19 28 15  ' ALT 7 - 35 U/L 18 45(H) 17    Lab Results  Component Value Date   WBC 6.1 07/30/2015   HGB 12.6 12/19/2016   HCT 37 12/19/2016   MCV 85.0 07/30/2015   PLT 246 12/19/2016   NEUTROABS 5.0 03/17/2015    ASSESSMENT & PLAN:  Breast cancer of lower-outer quadrant of left female breast Left lumpectomy 03/25/2015: Invasive ductal carcinoma, negative for LVID, DCIS with necrosis and calcifications, 0/1 lymph node, grade 2, 2.1 cm, T2 N0 M0 stage II a, Oncotype DX 13; 8% ROR status post  adjuvant radiation started 05/01/2015 started anastrozole 1 mg daily 06/28/2015  Anastrozole toxicities:  1. Hot flashes: Slightly better with increased dosage of Effexor. She is doing much better 2. Muscle aches and pains: Patient has completed the [employer] live strong program and since then has lost 25 pounds and she exercises regularly and watches what she eats. Because of this her muscle aches and pains have improved. She is also participating in yoga on a weekly basis.  Breast Cancer Surveillance: 1. Breast exam12/16/2020: Benign, seroma was drainedMultiple times by Dr. [name], patient still has a large seroma but it is  not bothering her 2. Mammogram6/9/2020benign. Postsurgical changes. Breast Density Category C  I renewed her prescription for anastrozole and Effexor. Return to clinic in 1 year for follow-up    No orders of the defined types were placed in this encounter.  The patient has a good understanding of the overall plan. she agrees with it. she will call with any problems that may develop before the next visit here.  Nicholas Lose, MD 10/22/2019  Julious Oka Dorshimer, am acting as scribe for Dr. Nicholas Lose.  I have reviewed the above document for accuracy and completeness, and I agree with the above.

## 2019-10-22 ENCOUNTER — Other Ambulatory Visit: Payer: Self-pay

## 2019-10-22 ENCOUNTER — Telehealth: Payer: Self-pay | Admitting: Hematology and Oncology

## 2019-10-22 ENCOUNTER — Inpatient Hospital Stay: Payer: BC Managed Care – PPO | Attending: Hematology and Oncology | Admitting: Hematology and Oncology

## 2019-10-22 DIAGNOSIS — Z79811 Long term (current) use of aromatase inhibitors: Secondary | ICD-10-CM | POA: Insufficient documentation

## 2019-10-22 DIAGNOSIS — C50512 Malignant neoplasm of lower-outer quadrant of left female breast: Secondary | ICD-10-CM

## 2019-10-22 DIAGNOSIS — Z79899 Other long term (current) drug therapy: Secondary | ICD-10-CM | POA: Diagnosis not present

## 2019-10-22 DIAGNOSIS — Z923 Personal history of irradiation: Secondary | ICD-10-CM | POA: Diagnosis not present

## 2019-10-22 DIAGNOSIS — Z170 Estrogen receptor positive status [ER+]: Secondary | ICD-10-CM | POA: Diagnosis not present

## 2019-10-22 DIAGNOSIS — N951 Menopausal and female climacteric states: Secondary | ICD-10-CM | POA: Insufficient documentation

## 2019-10-22 MED ORDER — ANASTROZOLE 1 MG PO TABS: 1.0000 mg | ORAL_TABLET | Freq: Every day | ORAL | 3 refills | Status: DC

## 2019-10-22 MED ORDER — VENLAFAXINE HCL ER 75 MG PO CP24: ORAL_CAPSULE | ORAL | 3 refills | Status: DC

## 2019-10-22 NOTE — Telephone Encounter (Signed)
I left a message regarding schedule  

## 2019-10-22 NOTE — Assessment & Plan Note (Signed)
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 Hot flashes: Slightly better with increased dosage of Effexor. She is doing much better 2. Muscle aches and pains: Patient has completed the [employer] live strong program and since then has lost 25 pounds and she exercises regularly and watches what she eats. Because of this her muscle aches and pains have improved.  Breast Cancer Surveillance: 1. Breast exam12/16/2020: Benign, seroma was drainedMultiple times by Dr. [name], patient still has a large seroma but it is not bothering her 2. Mammogram6/9/2020benign. Postsurgical changes. Breast Density Category C  Return to clinic in 1 year for follow-up

## 2019-12-08 ENCOUNTER — Ambulatory Visit: Payer: BC Managed Care – PPO

## 2020-02-02 ENCOUNTER — Telehealth: Payer: Self-pay | Admitting: Gastroenterology

## 2020-02-02 DIAGNOSIS — K2100 Gastro-esophageal reflux disease with esophagitis, without bleeding: Secondary | ICD-10-CM

## 2020-02-02 DIAGNOSIS — K2970 Gastritis, unspecified, without bleeding: Secondary | ICD-10-CM

## 2020-02-02 DIAGNOSIS — R1310 Dysphagia, unspecified: Secondary | ICD-10-CM

## 2020-02-02 DIAGNOSIS — K259 Gastric ulcer, unspecified as acute or chronic, without hemorrhage or perforation: Secondary | ICD-10-CM

## 2020-02-02 MED ORDER — PANTOPRAZOLE SODIUM 40 MG PO TBEC: 40.0000 mg | DELAYED_RELEASE_TABLET | Freq: Every day | ORAL | 1 refills | Status: DC

## 2020-02-02 NOTE — Telephone Encounter (Signed)
Patient informed she is due for follow up visit. Patient scheduled for 03/03/20 at 8:30am. Pantoprazole sent to patient's pharmacy.

## 2020-03-03 ENCOUNTER — Encounter: Payer: Self-pay | Admitting: Gastroenterology

## 2020-03-03 ENCOUNTER — Ambulatory Visit (INDEPENDENT_AMBULATORY_CARE_PROVIDER_SITE_OTHER): Payer: BC Managed Care – PPO | Admitting: Gastroenterology

## 2020-03-03 VITALS — BP 120/60 | HR 68 | Temp 97.6°F | Ht 72.0 in | Wt 226.2 lb

## 2020-03-03 DIAGNOSIS — R1310 Dysphagia, unspecified: Secondary | ICD-10-CM | POA: Diagnosis not present

## 2020-03-03 DIAGNOSIS — K259 Gastric ulcer, unspecified as acute or chronic, without hemorrhage or perforation: Secondary | ICD-10-CM | POA: Diagnosis not present

## 2020-03-03 DIAGNOSIS — K2100 Gastro-esophageal reflux disease with esophagitis, without bleeding: Secondary | ICD-10-CM | POA: Diagnosis not present

## 2020-03-03 MED ORDER — PANTOPRAZOLE SODIUM 40 MG PO TBEC: 40.0000 mg | DELAYED_RELEASE_TABLET | Freq: Every day | ORAL | 11 refills | Status: DC

## 2020-03-03 NOTE — Progress Notes (Signed)
    History of Present Illness: This is a 59 year old female with a history of GERD and a prior Nissen fundoplication.  Dysphagia with certain breads and rice has been stable but a persistent problem for couple years.  EGD performed last year did not reveal a stricture and her symptoms did not improve following dilation.  Heartburn symptoms are well controlled on daily pantoprazole.  No other gastrointestinal complaints.  EGD 12/2018 - LA Grade A reflux esophagitis. - No endoscopic esophageal abnormality to explain patient's dysphagia. Esophagus dilated. - A Nissen fundoplication was found. The wrap appears intact. - Non-bleeding erosive gastropathy. Biopsied. - Normal duodenal bulb and second portion of the duodenum.  Current Medications, Allergies, Past Medical History, Past Surgical History, Family History and Social History were reviewed in [employer] record.   Physical Exam: General: Well developed, well nourished, no acute distress Head: Normocephalic and atraumatic Eyes:  sclerae anicteric, EOMI Ears: Normal auditory acuity Mouth: Not examined, mask on during Covid-19 pandemic Lungs: Clear throughout to auscultation Heart: Regular rate and rhythm; no murmurs, rubs or bruits Abdomen: Soft, non tender and non distended. No masses, hepatosplenomegaly or hernias noted. Normal Bowel sounds Rectal: Not done Musculoskeletal: Symmetrical with no gross deformities  Pulses:  Normal pulses noted Extremities: No clubbing, cyanosis, edema or deformities noted Neurological: Alert oriented x 4, grossly nonfocal Psychological:  Alert and cooperative. Normal mood and affect   Assessment and Recommendations:  1.  GERD with LA grade A esophagitis.  Mild stable nonprogressive dysphagia with bread and rice.  Prior Nissen fundoplication.  History of erosive gastropathy.  Discussed potential further evaluation with a barium esophagram and esophageal manometry.  As her symptoms  are stable we will hold off on further evaluation at this time.  If her symptoms worsen she is advised to call.  Continue pantoprazole 40 mg daily.  Follow standard antireflux measures. REV in 1 year.  2.  CRC screening, average risk.  A 10-year interval screening colonoscopy is recommended in June 2027.

## 2020-03-03 NOTE — Patient Instructions (Signed)
We have sent the following medications to your pharmacy for you to pick up at your convenience: pantoprazole.   Normal BMI (Body Mass Index- based on height and weight) is between 19 and 25. Your BMI today is Body mass index is 30.68 kg/m. Marland Kitchen Please consider follow up  regarding your BMI with your Primary Care Provider.  Thank you for choosing me and [facility].  Pricilla Riffle. Dagoberto Ligas., MD., Marval Regal

## 2020-04-12 ENCOUNTER — Other Ambulatory Visit: Payer: Self-pay | Admitting: Hematology and Oncology

## 2020-04-12 DIAGNOSIS — Z853 Personal history of malignant neoplasm of breast: Secondary | ICD-10-CM

## 2020-04-27 ENCOUNTER — Encounter: Payer: Self-pay | Admitting: Hematology and Oncology

## 2020-04-27 ENCOUNTER — Ambulatory Visit
Admission: RE | Admit: 2020-04-27 | Discharge: 2020-04-27 | Disposition: A | Discharge status: Home / Self Care | Payer: BC Managed Care – PPO | Source: Ambulatory Visit | Attending: Hematology and Oncology | Admitting: Hematology and Oncology

## 2020-04-27 ENCOUNTER — Other Ambulatory Visit: Payer: Self-pay | Admitting: Hematology and Oncology

## 2020-04-27 ENCOUNTER — Other Ambulatory Visit: Payer: Self-pay

## 2020-04-27 DIAGNOSIS — R921 Mammographic calcification found on diagnostic imaging of breast: Secondary | ICD-10-CM | POA: Diagnosis not present

## 2020-04-27 DIAGNOSIS — Z853 Personal history of malignant neoplasm of breast: Secondary | ICD-10-CM

## 2020-07-15 DIAGNOSIS — L819 Disorder of pigmentation, unspecified: Secondary | ICD-10-CM | POA: Diagnosis not present

## 2020-07-15 DIAGNOSIS — L718 Other rosacea: Secondary | ICD-10-CM | POA: Diagnosis not present

## 2020-07-15 DIAGNOSIS — L905 Scar conditions and fibrosis of skin: Secondary | ICD-10-CM | POA: Diagnosis not present

## 2020-07-15 DIAGNOSIS — L814 Other melanin hyperpigmentation: Secondary | ICD-10-CM | POA: Diagnosis not present

## 2020-07-21 DIAGNOSIS — M25572 Pain in left ankle and joints of left foot: Secondary | ICD-10-CM | POA: Diagnosis not present

## 2020-07-29 DIAGNOSIS — S52501A Unspecified fracture of the lower end of right radius, initial encounter for closed fracture: Secondary | ICD-10-CM | POA: Diagnosis not present

## 2020-07-29 DIAGNOSIS — Z041 Encounter for examination and observation following transport accident: Secondary | ICD-10-CM | POA: Diagnosis not present

## 2020-07-29 DIAGNOSIS — I959 Hypotension, unspecified: Secondary | ICD-10-CM | POA: Diagnosis not present

## 2020-07-29 DIAGNOSIS — R52 Pain, unspecified: Secondary | ICD-10-CM | POA: Diagnosis not present

## 2020-07-29 DIAGNOSIS — S52571A Other intraarticular fracture of lower end of right radius, initial encounter for closed fracture: Secondary | ICD-10-CM | POA: Diagnosis not present

## 2020-07-29 DIAGNOSIS — S52601A Unspecified fracture of lower end of right ulna, initial encounter for closed fracture: Secondary | ICD-10-CM | POA: Diagnosis not present

## 2020-07-29 DIAGNOSIS — S2222XA Fracture of body of sternum, initial encounter for closed fracture: Secondary | ICD-10-CM | POA: Diagnosis not present

## 2020-07-29 DIAGNOSIS — M5489 Other dorsalgia: Secondary | ICD-10-CM | POA: Diagnosis not present

## 2020-08-03 ENCOUNTER — Encounter: Payer: Self-pay | Admitting: Hematology and Oncology

## 2020-08-04 ENCOUNTER — Ambulatory Visit: Payer: Self-pay

## 2020-08-04 DIAGNOSIS — G5601 Carpal tunnel syndrome, right upper limb: Secondary | ICD-10-CM | POA: Diagnosis not present

## 2020-08-04 DIAGNOSIS — S52501D Unspecified fracture of the lower end of right radius, subsequent encounter for closed fracture with routine healing: Secondary | ICD-10-CM | POA: Diagnosis not present

## 2020-08-04 DIAGNOSIS — M25531 Pain in right wrist: Secondary | ICD-10-CM | POA: Diagnosis not present

## 2020-08-04 DIAGNOSIS — S8012XD Contusion of left lower leg, subsequent encounter: Secondary | ICD-10-CM | POA: Diagnosis not present

## 2020-08-09 ENCOUNTER — Other Ambulatory Visit: Payer: Self-pay

## 2020-08-09 ENCOUNTER — Ambulatory Visit (HOSPITAL_COMMUNITY)
Admission: RE | Admit: 2020-08-09 | Discharge: 2020-08-09 | Disposition: A | Discharge status: Home / Self Care | Payer: Worker's Compensation | Source: Ambulatory Visit

## 2020-08-09 ENCOUNTER — Encounter (HOSPITAL_COMMUNITY): Payer: Self-pay

## 2020-08-09 VITALS — BP 120/70 | HR 72 | Temp 98.3°F | Resp 20

## 2020-08-09 DIAGNOSIS — S2220XA Unspecified fracture of sternum, initial encounter for closed fracture: Secondary | ICD-10-CM

## 2020-08-09 DIAGNOSIS — M79662 Pain in left lower leg: Secondary | ICD-10-CM | POA: Diagnosis not present

## 2020-08-09 DIAGNOSIS — M7989 Other specified soft tissue disorders: Secondary | ICD-10-CM

## 2020-08-09 DIAGNOSIS — S52501A Unspecified fracture of the lower end of right radius, initial encounter for closed fracture: Secondary | ICD-10-CM
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
## 2020-08-09 NOTE — Discharge Instructions (Addendum)
Unfortunately, we were not able to schedule an U/S for you now since they are closed. Please call [phone] to try and schedule this tomorrow. In the meantime, do not use any nonsteroidal anti-inflammatories (NSAIDs) like ibuprofen, Motrin, naproxen, Aleve, etc. which are all available over-the-counter.  Please just use Tylenol at a dose of 500mg -650mg  once every 6 hours as needed for your aches, pains, fevers.  Please make sure that you follow-up with Dr. Linton Rump, your orthopedic surgeon.  Request consultation with a cardiothoracic specialist regarding your sternal fracture.

## 2020-08-11 DIAGNOSIS — S8012XA Contusion of left lower leg, initial encounter: Secondary | ICD-10-CM | POA: Diagnosis not present

## 2020-08-11 DIAGNOSIS — S52501D Unspecified fracture of the lower end of right radius, subsequent encounter for closed fracture with routine healing: Secondary | ICD-10-CM | POA: Diagnosis not present

## 2020-08-11 DIAGNOSIS — S2220XA Unspecified fracture of sternum, initial encounter for closed fracture: Secondary | ICD-10-CM | POA: Diagnosis not present

## 2020-08-11 DIAGNOSIS — M660 Rupture of popliteal cyst: Secondary | ICD-10-CM | POA: Diagnosis not present

## 2020-08-11 DIAGNOSIS — M7989 Other specified soft tissue disorders: Secondary | ICD-10-CM | POA: Diagnosis not present

## 2020-08-11 DIAGNOSIS — X58XXXD Exposure to other specified factors, subsequent encounter: Secondary | ICD-10-CM | POA: Diagnosis not present

## 2020-08-24 DIAGNOSIS — R030 Elevated blood-pressure reading, without diagnosis of hypertension: Secondary | ICD-10-CM | POA: Diagnosis not present

## 2020-09-01 DIAGNOSIS — S52501D Unspecified fracture of the lower end of right radius, subsequent encounter for closed fracture with routine healing: Secondary | ICD-10-CM | POA: Diagnosis not present

## 2020-09-14 DIAGNOSIS — M25431 Effusion, right wrist: Secondary | ICD-10-CM | POA: Diagnosis not present

## 2020-09-14 DIAGNOSIS — M25531 Pain in right wrist: Secondary | ICD-10-CM | POA: Diagnosis not present

## 2020-09-14 DIAGNOSIS — M25631 Stiffness of right wrist, not elsewhere classified: Secondary | ICD-10-CM | POA: Diagnosis not present

## 2020-09-14 DIAGNOSIS — M25641 Stiffness of right hand, not elsewhere classified: Secondary | ICD-10-CM | POA: Diagnosis not present

## 2020-09-16 DIAGNOSIS — M25641 Stiffness of right hand, not elsewhere classified: Secondary | ICD-10-CM | POA: Diagnosis not present

## 2020-09-16 DIAGNOSIS — M25631 Stiffness of right wrist, not elsewhere classified: Secondary | ICD-10-CM | POA: Diagnosis not present

## 2020-09-16 DIAGNOSIS — M25431 Effusion, right wrist: Secondary | ICD-10-CM | POA: Diagnosis not present

## 2020-09-16 DIAGNOSIS — M25531 Pain in right wrist: Secondary | ICD-10-CM | POA: Diagnosis not present

## 2020-09-21 DIAGNOSIS — M25641 Stiffness of right hand, not elsewhere classified: Secondary | ICD-10-CM | POA: Diagnosis not present

## 2020-09-21 DIAGNOSIS — M25531 Pain in right wrist: Secondary | ICD-10-CM | POA: Diagnosis not present

## 2020-09-21 DIAGNOSIS — M25431 Effusion, right wrist: Secondary | ICD-10-CM | POA: Diagnosis not present

## 2020-09-21 DIAGNOSIS — M25631 Stiffness of right wrist, not elsewhere classified: Secondary | ICD-10-CM | POA: Diagnosis not present

## 2020-09-23 DIAGNOSIS — M25431 Effusion, right wrist: Secondary | ICD-10-CM | POA: Diagnosis not present

## 2020-09-23 DIAGNOSIS — M25531 Pain in right wrist: Secondary | ICD-10-CM | POA: Diagnosis not present

## 2020-09-23 DIAGNOSIS — M25631 Stiffness of right wrist, not elsewhere classified: Secondary | ICD-10-CM | POA: Diagnosis not present

## 2020-09-23 DIAGNOSIS — M25641 Stiffness of right hand, not elsewhere classified: Secondary | ICD-10-CM | POA: Diagnosis not present

## 2020-09-24 ENCOUNTER — Other Ambulatory Visit: Payer: Self-pay | Admitting: Internal Medicine

## 2020-09-24 DIAGNOSIS — Z79811 Long term (current) use of aromatase inhibitors: Secondary | ICD-10-CM

## 2020-09-24 DIAGNOSIS — Z13820 Encounter for screening for osteoporosis: Secondary | ICD-10-CM

## 2020-09-28 DIAGNOSIS — M25631 Stiffness of right wrist, not elsewhere classified: Secondary | ICD-10-CM | POA: Diagnosis not present

## 2020-09-28 DIAGNOSIS — M25531 Pain in right wrist: Secondary | ICD-10-CM | POA: Diagnosis not present

## 2020-09-28 DIAGNOSIS — M25431 Effusion, right wrist: Secondary | ICD-10-CM | POA: Diagnosis not present

## 2020-09-28 DIAGNOSIS — M25641 Stiffness of right hand, not elsewhere classified: Secondary | ICD-10-CM | POA: Diagnosis not present

## 2020-09-29 DIAGNOSIS — M25631 Stiffness of right wrist, not elsewhere classified: Secondary | ICD-10-CM | POA: Diagnosis not present

## 2020-09-29 DIAGNOSIS — M25531 Pain in right wrist: Secondary | ICD-10-CM | POA: Diagnosis not present

## 2020-09-29 DIAGNOSIS — S8012XD Contusion of left lower leg, subsequent encounter: Secondary | ICD-10-CM | POA: Diagnosis not present

## 2020-09-29 DIAGNOSIS — S52501P Unspecified fracture of the lower end of right radius, subsequent encounter for closed fracture with malunion: Secondary | ICD-10-CM | POA: Diagnosis not present

## 2020-09-29 DIAGNOSIS — S2220XD Unspecified fracture of sternum, subsequent encounter for fracture with routine healing: Secondary | ICD-10-CM | POA: Diagnosis not present

## 2020-10-05 DIAGNOSIS — M25631 Stiffness of right wrist, not elsewhere classified: Secondary | ICD-10-CM | POA: Diagnosis not present

## 2020-10-05 DIAGNOSIS — M25531 Pain in right wrist: Secondary | ICD-10-CM | POA: Diagnosis not present

## 2020-10-05 DIAGNOSIS — M25641 Stiffness of right hand, not elsewhere classified: Secondary | ICD-10-CM | POA: Diagnosis not present

## 2020-10-05 DIAGNOSIS — M25431 Effusion, right wrist: Secondary | ICD-10-CM | POA: Diagnosis not present

## 2020-10-07 DIAGNOSIS — M25531 Pain in right wrist: Secondary | ICD-10-CM | POA: Diagnosis not present

## 2020-10-07 DIAGNOSIS — M25431 Effusion, right wrist: Secondary | ICD-10-CM | POA: Diagnosis not present

## 2020-10-07 DIAGNOSIS — M25631 Stiffness of right wrist, not elsewhere classified: Secondary | ICD-10-CM | POA: Diagnosis not present

## 2020-10-07 DIAGNOSIS — M25641 Stiffness of right hand, not elsewhere classified: Secondary | ICD-10-CM | POA: Diagnosis not present

## 2020-10-11 DIAGNOSIS — M25641 Stiffness of right hand, not elsewhere classified: Secondary | ICD-10-CM | POA: Diagnosis not present

## 2020-10-11 DIAGNOSIS — M25531 Pain in right wrist: Secondary | ICD-10-CM | POA: Diagnosis not present

## 2020-10-11 DIAGNOSIS — M25631 Stiffness of right wrist, not elsewhere classified: Secondary | ICD-10-CM | POA: Diagnosis not present

## 2020-10-11 DIAGNOSIS — M25431 Effusion, right wrist: Secondary | ICD-10-CM | POA: Diagnosis not present

## 2020-10-14 DIAGNOSIS — M25641 Stiffness of right hand, not elsewhere classified: Secondary | ICD-10-CM | POA: Diagnosis not present

## 2020-10-14 DIAGNOSIS — M25531 Pain in right wrist: Secondary | ICD-10-CM | POA: Diagnosis not present

## 2020-10-14 DIAGNOSIS — M25431 Effusion, right wrist: Secondary | ICD-10-CM | POA: Diagnosis not present

## 2020-10-14 DIAGNOSIS — M25631 Stiffness of right wrist, not elsewhere classified: Secondary | ICD-10-CM | POA: Diagnosis not present

## 2020-10-18 DIAGNOSIS — M25531 Pain in right wrist: Secondary | ICD-10-CM | POA: Diagnosis not present

## 2020-10-18 DIAGNOSIS — M25641 Stiffness of right hand, not elsewhere classified: Secondary | ICD-10-CM | POA: Diagnosis not present

## 2020-10-18 DIAGNOSIS — M25631 Stiffness of right wrist, not elsewhere classified: Secondary | ICD-10-CM | POA: Diagnosis not present

## 2020-10-18 DIAGNOSIS — M25431 Effusion, right wrist: Secondary | ICD-10-CM | POA: Diagnosis not present
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
## 2020-10-20 NOTE — Assessment & Plan Note (Signed)
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 Hot flashes: Slightly better with increased dosage of Effexor. She is doing much better 2. Muscle aches and pains: Patient has completed the [employer] live strong program and since then has lost 25 pounds and she exercises regularly and watches what she eats. Because of this her muscle aches and pains have improved. She is also participating in yoga on a weekly basis.  Breast Cancer Surveillance: 1. Breast exam12/16/2021: Benign, seroma was drainedMultiple times by Dr. Wyman Songster still has a large seroma but it is not bothering her 2. Mammogram6/22/21benign. Postsurgical changes. Breast Density Category C  I renewed her prescription for anastrozole and Effexor. Return to clinic in 1 year for follow-up

## 2020-10-21 ENCOUNTER — Other Ambulatory Visit: Payer: Self-pay

## 2020-10-21 ENCOUNTER — Telehealth: Payer: Self-pay | Admitting: Hematology and Oncology

## 2020-10-21 ENCOUNTER — Ambulatory Visit: Payer: BC Managed Care – PPO | Admitting: Hematology and Oncology

## 2020-10-21 ENCOUNTER — Inpatient Hospital Stay: Payer: BC Managed Care – PPO | Attending: Hematology and Oncology | Admitting: Hematology and Oncology

## 2020-10-21 DIAGNOSIS — N6489 Other specified disorders of breast: Secondary | ICD-10-CM | POA: Diagnosis not present

## 2020-10-21 DIAGNOSIS — Z923 Personal history of irradiation: Secondary | ICD-10-CM | POA: Diagnosis not present

## 2020-10-21 DIAGNOSIS — C50512 Malignant neoplasm of lower-outer quadrant of left female breast: Secondary | ICD-10-CM | POA: Diagnosis not present

## 2020-10-21 DIAGNOSIS — Z9221 Personal history of antineoplastic chemotherapy: Secondary | ICD-10-CM | POA: Insufficient documentation

## 2020-10-21 DIAGNOSIS — M25631 Stiffness of right wrist, not elsewhere classified: Secondary | ICD-10-CM | POA: Diagnosis not present

## 2020-10-21 DIAGNOSIS — Z170 Estrogen receptor positive status [ER+]: Secondary | ICD-10-CM | POA: Diagnosis not present

## 2020-10-21 DIAGNOSIS — M791 Myalgia, unspecified site: Secondary | ICD-10-CM | POA: Diagnosis not present

## 2020-10-21 DIAGNOSIS — M25531 Pain in right wrist: Secondary | ICD-10-CM | POA: Diagnosis not present

## 2020-10-21 DIAGNOSIS — Z79811 Long term (current) use of aromatase inhibitors: Secondary | ICD-10-CM | POA: Diagnosis not present

## 2020-10-21 DIAGNOSIS — M25641 Stiffness of right hand, not elsewhere classified: Secondary | ICD-10-CM | POA: Diagnosis not present

## 2020-10-21 DIAGNOSIS — M25431 Effusion, right wrist: Secondary | ICD-10-CM | POA: Diagnosis not present

## 2020-10-21 MED ORDER — ANASTROZOLE 1 MG PO TABS: 1.0000 mg | ORAL_TABLET | Freq: Every day | ORAL | 3 refills | Status: DC

## 2020-10-21 MED ORDER — VENLAFAXINE HCL ER 75 MG PO CP24: ORAL_CAPSULE | ORAL | 3 refills | Status: DC

## 2020-10-21 NOTE — Telephone Encounter (Signed)
Scheduled appts per 12/16 los. Gave pt a print out of AVS.

## 2020-10-25 DIAGNOSIS — M25641 Stiffness of right hand, not elsewhere classified: Secondary | ICD-10-CM | POA: Diagnosis not present

## 2020-10-25 DIAGNOSIS — M25631 Stiffness of right wrist, not elsewhere classified: Secondary | ICD-10-CM | POA: Diagnosis not present

## 2020-10-25 DIAGNOSIS — M25531 Pain in right wrist: Secondary | ICD-10-CM | POA: Diagnosis not present

## 2020-10-25 DIAGNOSIS — M25431 Effusion, right wrist: Secondary | ICD-10-CM | POA: Diagnosis not present

## 2020-10-27 DIAGNOSIS — M25631 Stiffness of right wrist, not elsewhere classified: Secondary | ICD-10-CM | POA: Diagnosis not present

## 2020-10-27 DIAGNOSIS — M25431 Effusion, right wrist: Secondary | ICD-10-CM | POA: Diagnosis not present

## 2020-10-27 DIAGNOSIS — M25531 Pain in right wrist: Secondary | ICD-10-CM | POA: Diagnosis not present

## 2020-10-27 DIAGNOSIS — M25641 Stiffness of right hand, not elsewhere classified: Secondary | ICD-10-CM | POA: Diagnosis not present

## 2020-10-28 ENCOUNTER — Other Ambulatory Visit: Payer: Self-pay | Admitting: Hematology and Oncology

## 2020-10-28 ENCOUNTER — Ambulatory Visit
Admission: RE | Admit: 2020-10-28 | Discharge: 2020-10-28 | Disposition: A | Discharge status: Home / Self Care | Payer: BC Managed Care – PPO | Source: Ambulatory Visit | Attending: Hematology and Oncology | Admitting: Hematology and Oncology

## 2020-10-28 ENCOUNTER — Other Ambulatory Visit: Payer: Self-pay

## 2020-10-28 DIAGNOSIS — Z853 Personal history of malignant neoplasm of breast: Secondary | ICD-10-CM | POA: Diagnosis not present

## 2020-10-28 DIAGNOSIS — R921 Mammographic calcification found on diagnostic imaging of breast: Secondary | ICD-10-CM

## 2020-11-02 DIAGNOSIS — M25531 Pain in right wrist: Secondary | ICD-10-CM | POA: Diagnosis not present

## 2020-11-02 DIAGNOSIS — M25431 Effusion, right wrist: Secondary | ICD-10-CM | POA: Diagnosis not present

## 2020-11-02 DIAGNOSIS — M25631 Stiffness of right wrist, not elsewhere classified: Secondary | ICD-10-CM | POA: Diagnosis not present

## 2020-11-02 DIAGNOSIS — M25641 Stiffness of right hand, not elsewhere classified: Secondary | ICD-10-CM | POA: Diagnosis not present

## 2020-11-09 DIAGNOSIS — M25641 Stiffness of right hand, not elsewhere classified: Secondary | ICD-10-CM | POA: Diagnosis not present

## 2020-11-09 DIAGNOSIS — M25531 Pain in right wrist: Secondary | ICD-10-CM | POA: Diagnosis not present

## 2020-11-09 DIAGNOSIS — M25431 Effusion, right wrist: Secondary | ICD-10-CM | POA: Diagnosis not present

## 2020-11-09 DIAGNOSIS — M25631 Stiffness of right wrist, not elsewhere classified: Secondary | ICD-10-CM | POA: Diagnosis not present

## 2020-12-02 DIAGNOSIS — M25531 Pain in right wrist: Secondary | ICD-10-CM | POA: Diagnosis not present

## 2020-12-02 DIAGNOSIS — M25641 Stiffness of right hand, not elsewhere classified: Secondary | ICD-10-CM | POA: Diagnosis not present

## 2020-12-02 DIAGNOSIS — M25431 Effusion, right wrist: Secondary | ICD-10-CM | POA: Diagnosis not present

## 2020-12-02 DIAGNOSIS — M25631 Stiffness of right wrist, not elsewhere classified: Secondary | ICD-10-CM | POA: Diagnosis not present

## 2020-12-16 ENCOUNTER — Inpatient Hospital Stay: Admission: RE | Admit: 2020-12-16 | Payer: BC Managed Care – PPO | Source: Ambulatory Visit

## 2020-12-24 DIAGNOSIS — E669 Obesity, unspecified: Secondary | ICD-10-CM | POA: Diagnosis not present

## 2020-12-31 DIAGNOSIS — Z23 Encounter for immunization: Secondary | ICD-10-CM | POA: Diagnosis not present

## 2020-12-31 DIAGNOSIS — E559 Vitamin D deficiency, unspecified: Secondary | ICD-10-CM | POA: Diagnosis not present

## 2020-12-31 DIAGNOSIS — Z1331 Encounter for screening for depression: Secondary | ICD-10-CM | POA: Diagnosis not present

## 2020-12-31 DIAGNOSIS — Z0000 Encounter for general adult medical examination without abnormal findings: Secondary | ICD-10-CM | POA: Diagnosis not present

## 2021-02-07 DIAGNOSIS — Z01419 Encounter for gynecological examination (general) (routine) without abnormal findings: Secondary | ICD-10-CM | POA: Diagnosis not present

## 2021-02-07 DIAGNOSIS — Z6831 Body mass index (BMI) 31.0-31.9, adult: Secondary | ICD-10-CM | POA: Diagnosis not present

## 2021-02-16 ENCOUNTER — Other Ambulatory Visit: Payer: Self-pay

## 2021-02-16 ENCOUNTER — Ambulatory Visit
Admission: RE | Admit: 2021-02-16 | Discharge: 2021-02-16 | Disposition: A | Discharge status: Home / Self Care | Payer: BC Managed Care – PPO | Source: Ambulatory Visit | Attending: Internal Medicine | Admitting: Internal Medicine

## 2021-02-16 DIAGNOSIS — Z79811 Long term (current) use of aromatase inhibitors: Secondary | ICD-10-CM

## 2021-02-16 DIAGNOSIS — Z780 Asymptomatic menopausal state: Secondary | ICD-10-CM | POA: Diagnosis not present

## 2021-02-17 ENCOUNTER — Other Ambulatory Visit: Payer: BC Managed Care – PPO

## 2021-02-22 ENCOUNTER — Other Ambulatory Visit: Payer: Self-pay | Admitting: Gastroenterology

## 2021-02-22 DIAGNOSIS — K259 Gastric ulcer, unspecified as acute or chronic, without hemorrhage or perforation: Secondary | ICD-10-CM

## 2021-02-22 DIAGNOSIS — R1310 Dysphagia, unspecified: Secondary | ICD-10-CM

## 2021-04-06 DIAGNOSIS — H01004 Unspecified blepharitis left upper eyelid: Secondary | ICD-10-CM | POA: Diagnosis not present

## 2021-04-18 ENCOUNTER — Other Ambulatory Visit: Payer: Self-pay

## 2021-04-18 ENCOUNTER — Encounter (HOSPITAL_COMMUNITY): Payer: Self-pay

## 2021-04-18 ENCOUNTER — Ambulatory Visit (HOSPITAL_COMMUNITY)
Admission: RE | Admit: 2021-04-18 | Discharge: 2021-04-18 | Disposition: A | Discharge status: Home / Self Care | Payer: BC Managed Care – PPO | Source: Ambulatory Visit | Attending: Nurse Practitioner | Admitting: Nurse Practitioner

## 2021-04-18 VITALS — BP 130/79 | HR 65 | Temp 98.5°F | Resp 18

## 2021-04-18 DIAGNOSIS — L237 Allergic contact dermatitis due to plants, except food: Secondary | ICD-10-CM

## 2021-04-18 DIAGNOSIS — L299 Pruritus, unspecified: Secondary | ICD-10-CM

## 2021-04-18 MED ORDER — PREDNISONE 10 MG (21) PO TBPK: ORAL_TABLET | Freq: Every day | ORAL | 0 refills | Status: DC

## 2021-04-18 MED ORDER — DEXAMETHASONE SODIUM PHOSPHATE 10 MG/ML IJ SOLN
INTRAMUSCULAR | Status: AC
  Filled 2021-04-18: qty 1

## 2021-04-18 MED ORDER — DEXAMETHASONE SODIUM PHOSPHATE 10 MG/ML IJ SOLN
10.0000 mg | Freq: Once | INTRAMUSCULAR | Status: AC
  Administered 2021-04-18: 10 mg via INTRAMUSCULAR

## 2021-04-18 NOTE — ED Triage Notes (Addendum)
Patient complains of poison ivy that started 12 days ago.  Patient  has ?rash near left eye.  Patient has rash on torso and extremities  Has tried triamcinolone 0.1% cream,  Poison ivy and oak scrub, calamine -no improvement

## 2021-04-18 NOTE — Discharge Instructions (Addendum)
Take steroids as prescribed. Make sure you finish the entire course even if you start to feel better  You may take benadryl as needed for itching. If it makes you too sleepy during the day then you can take it at night before bed.  Moisturize your skin as needed. Put cool cloths on your skin. Put a baking soda paste on your skin. Stir water into baking soda until it looks like a paste. Do not scratch your skin. Avoid having things rub up against your skin. Avoid the use of soaps, perfumes, and dyes.

## 2021-04-18 NOTE — ED Provider Notes (Signed)
[place]    CSN: [id number] Arrival date & time: 04/18/21  1149      History   Chief Complaint Chief Complaint  Patient presents with   Rash   Appointment    12:00 noon    HPI Wanda Moore is a 60 y.o. female.   Subjective:   Wanda Moore is a 60 y.o. female who presents for evaluation of rash. Rash started 12 days ago. Initial distribution: bilateral arm and bilateral lower leg. Lesions are pink in color and are of raised texture. Rash has not changed over time.  Rash is pruritic. Patient denies: fever, headache, myalgia, nausea or vomiting. Patient has not had previous evaluation of rash. Patient has tried benadryl, OTC hydrocortisone cream and calamine lotion without any relief in symptoms. Patient has not had contacts with similar rash. Patient thinks that her symptoms are due to either poison oak/ivy.   The following portions of the patient's history were reviewed and updated as appropriate: allergies, current medications, past family history, past medical history, past social history, past surgical history, and problem list.      Past Medical History:  Diagnosis Date   Allergy    Anxiety    Barrett esophagus 2008   Breast cancer ([place])    Breast cancer of lower-outer quadrant of left female breast ([place]) 03/15/2015   Cough 08-03-2012    nonproductive   Distal radius fracture, left 12/05/2012   Diverticulosis of colon (without mention of hemorrhage)    Esophageal reflux    Hiatal hernia 2013   large   Melanoma ([place])    removed from betwen shoulder blades   Personal history of radiation therapy 2016   Recurrent UTI    Stricture and stenosis of esophagus 2013    Patient Active Problem List   Diagnosis Date Noted   Rectal bleeding 04/21/2016   Family history of malignant neoplasm of breast 03/18/2015   Melanoma of skin ([place]) 03/18/2015   Breast cancer of lower-outer quadrant of left female breast ([place]) 03/15/2015   Abdominal  pain, epigastric 04/16/2014   History of Nissen fundoplication 47/42/5956   Routine health maintenance 05/04/2013   Distal radius fracture, left 12/05/2012   NECK PAIN 01/05/2010   ESOPHAGEAL STRICTURE 04/20/2008   BARRETTS ESOPHAGUS 04/20/2008   ALLERGIC RHINITIS 12/13/2007   GERD 12/13/2007   DIVERTICULOSIS, COLON 12/13/2007   MELANOMA, TRUNK, HX OF 12/13/2007   CHOLECYSTECTOMY, HX OF 07/16/2007    Past Surgical History:  Procedure Laterality Date   BREAST LUMPECTOMY Left 2016   BREAST LUMPECTOMY WITH RADIOACTIVE SEED AND SENTINEL LYMPH NODE BIOPSY Left 03/25/2015   Procedure: LEFT BREAST LUMPECTOMY WITH RADIOACTIVE SEED AND LEFT AXILLARY SENTINEL LYMPH NODE BIOPSY;  Surgeon: Alphonsa Overall, MD;  Location: [place];  Service: General;  Laterality: Left;   CESAREAN SECTION  1992   CHOLECYSTECTOMY     COLONOSCOPY  03/25/2007   Diverticulosis    ESOPHAGOGASTRODUODENOSCOPY  04/10/2012   HH, and Stricture   LAPAROSCOPIC NISSEN FUNDOPLICATION  38/05/5642   Procedure: LAPAROSCOPIC NISSEN FUNDOPLICATION;  Surgeon: Pedro Earls, MD;  Location[facility];  Service: General;  Laterality: N/A;  HIATAL HERNIA REPAIR   MELANOMA EXCISION     back   OPEN REDUCTION INTERNAL FIXATION (ORIF) DISTAL RADIAL FRACTURE  12/05/2012   Procedure: OPEN REDUCTION INTERNAL FIXATION (ORIF) DISTAL RADIAL FRACTURE;  Surgeon: Johnny Bridge, MD;  Location: [facility];  Service: Orthopedics;  Laterality: Left;    OB History  No obstetric history on file.      Home Medications    Prior to Admission medications   Medication Sig Start Date End Date Taking? Authorizing Provider  anastrozole (ARIMIDEX) 1 MG tablet Take 1 tablet (1 mg total) by mouth daily. 10/21/20  Yes Nicholas Lose, MD  Calcium Carbonate-Vitamin D (CALCIUM-VITAMIN D) 500-200 MG-UNIT per tablet Take 1 tablet by mouth 2 (two) times daily.   Yes [provider]  pantoprazole (PROTONIX) 40 MG tablet TAKE 1  TABLET BY MOUTH EVERY DAY 02/22/21  Yes Ladene Artist, MD  predniSONE (STERAPRED UNI-PAK 21 TAB) 10 MG (21) TBPK tablet Take by mouth daily. Take 6 tabs by mouth daily  for 2 days, then 5 tabs for 2 days, then 4 tabs for 2 days, then 3 tabs for 2 days, 2 tabs for 2 days, then 1 tab by mouth daily for 2 days 04/18/21  Yes Enrique Sack, FNP  venlafaxine XR (EFFEXOR-XR) 75 MG 24 hr capsule TAKE 1 CAPSULE BY MOUTH EVERY DAY WITH BREAKFAST 10/21/20  Yes Nicholas Lose, MD    Family History Family History  Problem Relation Age of Onset   Hypertension Mother    Diabetes Mother    Heart disease Mother    Lung cancer Father    Other Father        father was adopted - no paternal family history information   Diabetes Maternal Grandmother    Breast cancer Sister    Cancer Sister 56       breast (half sister - same mother)   Colon cancer Neg Hx     Social History Social History   Tobacco Use   Smoking status: Never   Smokeless tobacco: Never  Vaping Use   Vaping Use: Never used  Substance Use Topics   Alcohol use: Yes    Alcohol/week: 0.0 standard drinks    Comment: socially   Drug use: No     Allergies   Buprenex [buprenorphine] and Pseudoephedrine hcl   Review of Systems Review of Systems  Constitutional:  Negative for fever.  Gastrointestinal:  Negative for nausea and vomiting.  Musculoskeletal:  Negative for myalgias.  Skin:  Positive for rash.  All other systems reviewed and are negative.   Physical Exam Triage Vital Signs ED Triage Vitals  Enc Vitals Group     BP 04/18/21 1312 130/79     Pulse Rate 04/18/21 1312 65     Resp 04/18/21 1312 18     Temp 04/18/21 1312 98.5 F (36.9 C)     Temp Source 04/18/21 1312 Oral     SpO2 04/18/21 1312 95 %     Weight --      Height --      Head Circumference --      Peak Flow --      Pain Score 04/18/21 1309 5     Pain Loc --      Pain Edu? --      Excl. in [place]? --    No data found.  Updated Vital Signs BP  130/79 (BP Location: Right Arm)   Pulse 65   Temp 98.5 F (36.9 C) (Oral)   Resp 18   SpO2 95%   Visual Acuity Right Eye Distance:   Left Eye Distance:   Bilateral Distance:    Right Eye Near:   Left Eye Near:    Bilateral Near:     Physical Exam Constitutional:      Appearance: Normal appearance.  HENT:     Head: Normocephalic.     Mouth/Throat:     Mouth: Mucous membranes are moist.  Cardiovascular:     Rate and Rhythm: Normal rate and regular rhythm.  Pulmonary:     Effort: Pulmonary effort is normal.  Musculoskeletal:        General: Normal range of motion.     Cervical back: Normal range of motion and neck supple.  Skin:    General: Skin is warm and dry.     Findings: Rash present.     Comments: Erythematous rash noted to bilateral arms and lower legs   Neurological:     General: No focal deficit present.     Mental Status: She is alert and oriented to person, place, and time.  Psychiatric:        Mood and Affect: Mood normal.        Behavior: Behavior normal.     UC Treatments / Results  Labs (all labs ordered are listed, but only abnormal results are displayed) Labs Reviewed - No data to display  EKG   Radiology No results found.  Procedures Procedures (including critical care time)  Medications Ordered in UC Medications  dexamethasone (DECADRON) injection 10 mg (has no administration in time range)    Initial Impression / Assessment and Plan / UC Course  I have reviewed the triage vital signs and the nursing notes.  Pertinent labs & imaging results that were available during my care of the patient were reviewed by me and considered in my medical decision making (see chart for details).     60 year old female presenting with a pruritic erythematous rash consistent with toxicodendron dermatitis. Patient is afebrile and without any systemic systems. Decadron injection given in clinic followed by a prednisone taper. Reassurance was given to the  patient. Information on the above diagnosis was given to the patient. Benadryl prn for itching. Aveeno baths. Skin moisturizer. Observe for signs of superimposed infection and systemic symptoms. Watch for signs of fever or worsening of the rash.  Today's evaluation has revealed no signs of a dangerous process. Discussed diagnosis with patient and/or guardian. Patient and/or guardian aware of their diagnosis, possible red flag symptoms to watch out for and need for close follow up. Patient and/or guardian understands verbal and written discharge instructions. Patient and/or guardian comfortable with plan and disposition.  Patient and/or guardian has a clear mental status at this time, good insight into illness (after discussion and teaching) and has clear judgment to make decisions regarding their care  This care was provided during an unprecedented National Emergency due to the Novel Coronavirus (COVID-19) pandemic. COVID-19 infections and transmission risks place heavy strains on healthcare resources.  As this pandemic evolves, our facility, providers, and staff strive to respond fluidly, to remain operational, and to provide care relative to available resources and information. Outcomes are unpredictable and treatments are without well-defined guidelines. Further, the impact of COVID-19 on all aspects of urgent care, including the impact to patients seeking care for reasons other than COVID-19, is unavoidable during this national emergency. At this time of the global pandemic, management of patients has significantly changed, even for non-COVID positive patients given high local and regional COVID volumes at this time requiring high healthcare system and resource utilization. The standard of care for management of both COVID suspected and non-COVID suspected patients continues to change rapidly at the local, regional, national, and global levels. This patient was worked up and treated to the  best available but  ever changing evidence and resources available at this current time.   Documentation was completed with the aid of voice recognition software. Transcription may contain typographical errors. Final Clinical Impressions(s) / UC Diagnoses   Final diagnoses:  Allergic contact dermatitis due to plants, except food  Pruritus     Discharge Instructions      Take steroids as prescribed. Make sure you finish the entire course even if you start to feel better  You may take benadryl as needed for itching. If it makes you too sleepy during the day then you can take it at night before bed.  Moisturize your skin as needed. Put cool cloths on your skin. Put a baking soda paste on your skin. Stir water into baking soda until it looks like a paste. Do not scratch your skin. Avoid having things rub up against your skin. Avoid the use of soaps, perfumes, and dyes.     ED Prescriptions     Medication Sig Dispense Auth. Provider   predniSONE (STERAPRED UNI-PAK 21 TAB) 10 MG (21) TBPK tablet Take by mouth daily. Take 6 tabs by mouth daily  for 2 days, then 5 tabs for 2 days, then 4 tabs for 2 days, then 3 tabs for 2 days, 2 tabs for 2 days, then 1 tab by mouth daily for 2 days 42 tablet Enrique Sack, FNP      PDMP not reviewed this encounter.   Enrique Sack, [place] 04/18/21 1402

## 2021-05-22 ENCOUNTER — Other Ambulatory Visit: Payer: Self-pay | Admitting: Gastroenterology

## 2021-05-22 DIAGNOSIS — K259 Gastric ulcer, unspecified as acute or chronic, without hemorrhage or perforation: Secondary | ICD-10-CM

## 2021-05-22 DIAGNOSIS — R1310 Dysphagia, unspecified: Secondary | ICD-10-CM

## 2021-06-09 ENCOUNTER — Ambulatory Visit
Admission: RE | Admit: 2021-06-09 | Discharge: 2021-06-09 | Disposition: A | Discharge status: Home / Self Care | Payer: BC Managed Care – PPO | Source: Ambulatory Visit | Attending: Hematology and Oncology | Admitting: Hematology and Oncology

## 2021-06-09 ENCOUNTER — Other Ambulatory Visit: Payer: Self-pay

## 2021-06-09 DIAGNOSIS — R921 Mammographic calcification found on diagnostic imaging of breast: Secondary | ICD-10-CM | POA: Diagnosis not present

## 2021-06-09 DIAGNOSIS — R922 Inconclusive mammogram: Secondary | ICD-10-CM | POA: Diagnosis not present

## 2021-06-13 ENCOUNTER — Telehealth: Payer: Self-pay | Admitting: Gastroenterology

## 2021-06-13 NOTE — Telephone Encounter (Signed)
Could offer her a 1:30 on 8/11.

## 2021-06-13 NOTE — Telephone Encounter (Signed)
Pt sent an message via the [facility] Chart appointment request pool as follows:  "having trouble with food "getting stuck" as it goes down stuck and painful i am taking Protonix."

## 2021-06-17 ENCOUNTER — Ambulatory Visit (INDEPENDENT_AMBULATORY_CARE_PROVIDER_SITE_OTHER): Payer: BC Managed Care – PPO | Admitting: Physician Assistant

## 2021-06-17 ENCOUNTER — Encounter: Payer: Self-pay | Admitting: Physician Assistant

## 2021-06-17 VITALS — BP 120/70 | HR 68 | Ht 72.0 in | Wt 234.0 lb

## 2021-06-17 DIAGNOSIS — R1310 Dysphagia, unspecified: Secondary | ICD-10-CM

## 2021-06-17 DIAGNOSIS — K2100 Gastro-esophageal reflux disease with esophagitis, without bleeding: Secondary | ICD-10-CM

## 2021-06-17 MED ORDER — PANTOPRAZOLE SODIUM 40 MG PO TBEC: 40.0000 mg | DELAYED_RELEASE_TABLET | Freq: Every day | ORAL | 3 refills | Status: DC

## 2021-06-17 NOTE — Patient Instructions (Signed)
We have sent the following medications to your pharmacy for you to pick up at your convenience: Pantoprazole 40 mg daily 30-60 minutes before breakfast.   IMAGING:  You will be contacted by [facility] Scheduling (Your caller ID will indicate phone # [phone]) within the next business 7-10 business days to schedule your Barium esophogram with tablet. If you have not heard from them within 7-10 business days, please call [place] at [phone] to follow up on the status of your appointment.    If you are age 61 or older, your body mass index should be between 23-30. Your Body mass index is 31.74 kg/m. If this is out of the aforementioned range listed, please consider follow up with your Primary Care Provider.  If you are age 61 or younger, your body mass index should be between 19-25. Your Body mass index is 31.74 kg/m. If this is out of the aformentioned range listed, please consider follow up with your Primary Care Provider.   __________________________________________________________  The [facility] providers would like to encourage you to use [facility] to communicate with providers for non-urgent requests or questions.  Due to long hold times on the telephone, sending your provider a message by [facility] may be a faster and more efficient way to get a response.  Please allow 48 business hours for a response.  Please remember that this is for non-urgent requests.

## 2021-06-17 NOTE — Progress Notes (Signed)
Chief Complaint: Dysphagia  HPI:    Wanda Moore is a 60 year old Caucasian female with a past medical history as listed below including Barrett's esophagus and reflux, known to Dr. Fuller Plan, who presents to clinic today with complaint of dysphagia.    12/2018 EGD with LA grade a reflux esophagitis, no endoscopic esophageal abnormality to explain dysphagia though esophagus empirically dilated, Niesen fundoplication with intact wrap and nonbleeding erosive gastropathy.    03/03/2020 patient saw Dr. Fuller Plan for dysphagia.  It was discussed that she had an EGD the prior year which did not reveal a stricture and her symptoms did not improve following dilation.  Her heartburn is controlled on daily Pantoprazole.  That time discussed further evaluation with a barium esophagram and esophageal manometry.  It was thought that since her symptoms were stable they would hold off.  She was continued on Pantoprazole 40 mg daily.  Repeat screening colonoscopy recommended June 2027.    Today, the patient tells me that she has always had some amount of dysphagia, but over the past 6 months this has started to cause her more frequent problems.  Tells me previously was just with "bread, chicken and rice", so she obviously tried to avoid those foods but now it even happened with a green bean and some other things which she cannot identify.  She knows all of the things to try to do as far as avoiding distraction, chewing well and taking sips of water in between but still suffers with food sometimes getting stuck in her throat and oftentimes this is uncomfortable until she can get it down.  Reflux controlled on Pantoprazole 40 mg daily.    Denies fever, chills, weight loss, abdominal pain or change in bowel habits.  Past Medical History:  Diagnosis Date   Allergy    Anxiety    Barrett esophagus 2008   Breast cancer [facility])    Breast cancer of lower-outer quadrant of left female breast ([place]) 03/15/2015   Cough 08-03-2012     nonproductive   Distal radius fracture, left 12/05/2012   Diverticulosis of colon (without mention of hemorrhage)    Esophageal reflux    Hiatal hernia 2013   large   Melanoma ([place])    removed from betwen shoulder blades   Personal history of radiation therapy 2016   Recurrent UTI    Stricture and stenosis of esophagus 2013    Past Surgical History:  Procedure Laterality Date   BREAST LUMPECTOMY Left 2016   BREAST LUMPECTOMY WITH RADIOACTIVE SEED AND SENTINEL LYMPH NODE BIOPSY Left 03/25/2015   Procedure: LEFT BREAST LUMPECTOMY WITH RADIOACTIVE SEED AND LEFT AXILLARY SENTINEL LYMPH NODE BIOPSY;  Surgeon: Alphonsa Overall, MD;  Location: [place];  Service: General;  Laterality: Left;   [place]     COLONOSCOPY  03/25/2007   Diverticulosis    ESOPHAGOGASTRODUODENOSCOPY  04/10/2012   HH, and Stricture   LAPAROSCOPIC NISSEN FUNDOPLICATION  99991111   Procedure: LAPAROSCOPIC NISSEN FUNDOPLICATION;  Surgeon: Pedro Earls, MD;  [facility];  Service: General;  Laterality: N/A;  HIATAL HERNIA REPAIR   MELANOMA EXCISION     back   OPEN REDUCTION INTERNAL FIXATION (ORIF) DISTAL RADIAL FRACTURE  12/05/2012   Procedure: OPEN REDUCTION INTERNAL FIXATION (ORIF) DISTAL RADIAL FRACTURE;  Surgeon: Johnny Bridge, MD;  Location: [place];  Service: Orthopedics;  Laterality: Left;    Current Outpatient Medications  Medication Sig Dispense Refill   anastrozole (ARIMIDEX)  1 MG tablet Take 1 tablet (1 mg total) by mouth daily. 90 tablet 3   Calcium Carbonate-Vitamin D (CALCIUM-VITAMIN D) 500-200 MG-UNIT per tablet Take 1 tablet by mouth 2 (two) times daily.     pantoprazole (PROTONIX) 40 MG tablet TAKE 1 TABLET BY MOUTH EVERY DAY 90 tablet 0   predniSONE (STERAPRED UNI-PAK 21 TAB) 10 MG (21) TBPK tablet Take by mouth daily. Take 6 tabs by mouth daily  for 2 days, then 5 tabs for 2 days, then 4 tabs for 2 days, then 3 tabs for 2  days, 2 tabs for 2 days, then 1 tab by mouth daily for 2 days 42 tablet 0   venlafaxine XR (EFFEXOR-XR) 75 MG 24 hr capsule TAKE 1 CAPSULE BY MOUTH EVERY DAY WITH BREAKFAST 90 capsule 3   No current facility-administered medications for this visit.    Allergies as of 06/17/2021 - Review Complete 04/18/2021  Allergen Reaction Noted   Buprenex [buprenorphine] Itching 08/09/2012   Pseudoephedrine hcl  12/18/2018    Family History  Problem Relation Age of Onset   Hypertension Mother    Diabetes Mother    Heart disease Mother    Lung cancer Father    Other Father        father was adopted - no paternal family history information   Diabetes Maternal Grandmother    Breast cancer Sister    Cancer Sister 41       breast (half sister - same mother)   Colon cancer Neg Hx     Social History   Socioeconomic History   Marital status: Married    Spouse name: Not on file   Number of children: 2   Years of education: 14   Highest education level: Not on file  Occupational History   Occupation: adm Clinical cytogeneticist: [employer]  Tobacco Use   Smoking status: Never   Smokeless tobacco: Never  Vaping Use   Vaping Use: Never used  Substance and Sexual Activity   Alcohol use: Yes    Alcohol/week: 0.0 standard drinks    Comment: socially   Drug use: No   Sexual activity: Yes  Other Topics Concern   Not on file  Social History Narrative   HSG, [employer] - 2 years. Married 1990. 2 dtrs - '92, '94. Marriage in good health.   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   Feels safe at home and denies abuse            Social Determinants of Radio broadcast assistant Strain: Not on file  Food Insecurity: Not on file  Transportation Needs: Not on file  Physical Activity: Not on file  Stress: Not on file  Social Connections: Not on file  Intimate Partner Violence: Not on file     Review of Systems:    Constitutional: No weight loss, fever or chills Cardiovascular: No chest pain Respiratory: No SOB  Gastrointestinal: See HPI and otherwise negative   Physical Exam:  Vital signs: BP 120/70   Pulse 68   Ht 6' (1.829 m)   Wt 234 lb (106.1 kg)   SpO2 98%   BMI 31.74 kg/m    Constitutional:   Pleasant Caucasian female appears to be in NAD, Well developed, Well nourished, alert and cooperative Respiratory: Respirations even and unlabored. Lungs clear to auscultation bilaterally.   No wheezes, crackles, or rhonchi.  Cardiovascular: Normal S1, S2. No MRG. Regular rate and rhythm. No peripheral edema, cyanosis or pallor.  Gastrointestinal:  Soft, nondistended, nontender. No rebound or guarding. Normal bowel sounds. No appreciable masses or hepatomegaly. Rectal:  Not performed.  Psychiatric: Oriented to person, place and time. Demonstrates good judgement and reason without abnormal affect or behaviors.  No recent labs or imaging.  Assessment: 1.  Dysphagia: Chronic for the patient but worsened over the past 6 months, previous EGD with empiric dilation unhelpful, at time of last follow-up in 21 Dr. Fuller Plan recommended a barium esophagram +/- esophageal manometry for further evaluation  Plan: 1.  Scheduled patient for barium esophagram with tablet as per recommendations from Dr. Fuller Plan at last visit. 2.  Continue Pantoprazole 40 mg daily, 30-60 minutes before breakfast.  Refilled patient's prescription #90 with 3 refills. 3.  Reviewed anti-dysphagia measures. 4.  Patient to follow in clinic per recommendations after barium esophagram.  Wanda Newer, PA-C [facility] 06/17/2021, 1:30 PM  Cc: Vania Rea, MD

## 2021-06-22 NOTE — Progress Notes (Signed)
Reviewed and agree with management plan.  Gershom Brobeck T. Esha Fincher, MD FACG 

## 2021-06-28 ENCOUNTER — Other Ambulatory Visit: Payer: Self-pay

## 2021-06-28 ENCOUNTER — Encounter (HOSPITAL_COMMUNITY): Payer: Self-pay

## 2021-06-28 ENCOUNTER — Ambulatory Visit (HOSPITAL_COMMUNITY)
Admission: RE | Admit: 2021-06-28 | Discharge: 2021-06-28 | Disposition: A | Discharge status: Home / Self Care | Payer: BC Managed Care – PPO | Source: Ambulatory Visit | Attending: Student | Admitting: Student

## 2021-06-28 VITALS — BP 136/84 | HR 73 | Temp 98.3°F | Resp 20

## 2021-06-28 DIAGNOSIS — L237 Allergic contact dermatitis due to plants, except food: Secondary | ICD-10-CM

## 2021-06-28 MED ORDER — DEXAMETHASONE SODIUM PHOSPHATE 10 MG/ML IJ SOLN
INTRAMUSCULAR | Status: AC
  Filled 2021-06-28: qty 1

## 2021-06-28 MED ORDER — DEXAMETHASONE SODIUM PHOSPHATE 10 MG/ML IJ SOLN
10.0000 mg | Freq: Once | INTRAMUSCULAR | Status: AC
  Administered 2021-06-28: 10 mg via INTRAMUSCULAR

## 2021-06-28 MED ORDER — PREDNISONE 10 MG (21) PO TBPK: ORAL_TABLET | Freq: Every day | ORAL | 0 refills | Status: DC

## 2021-06-28 MED ORDER — DEXAMETHASONE SODIUM PHOSPHATE 10 MG/ML IJ SOLN: 20.0000 mg | Freq: Once | INTRAMUSCULAR | Status: DC

## 2021-06-28 NOTE — ED Triage Notes (Signed)
Noticed rash on Sunday Patient did yard work on Saturday Rash to both arms

## 2021-06-28 NOTE — ED Provider Notes (Signed)
[place]    CSN: [id number] Arrival date & time: 06/28/21  1344      History   Chief Complaint Chief Complaint  Patient presents with   Rash    HPI Wanda Moore is a 60 y.o. female presenting with concern for poison ivy.  Medical history allergies, melanoma.  Patient states that she did some yard work on 4 days ago, then noticed a rash 1 day after.  Itchy rash to both arms. OTC benedryl and calamine providing minimal relief.  States she actually had poison ivy a few months ago and we gave her Decadron and prednisone taper with improvement in the symptoms.  Denies any facial involvement, shortness of breath.  HPI  Past Medical History:  Diagnosis Date   Allergy    Anxiety    Barrett esophagus 2008   Breast cancer [facility])    Breast cancer of lower-outer quadrant of left female breast ([place]) 03/15/2015   Cough 08-03-2012    nonproductive   Distal radius fracture, left 12/05/2012   Diverticulosis of colon (without mention of hemorrhage)    Esophageal reflux    Hiatal hernia 2013   large   Melanoma ([place])    removed from betwen shoulder blades   Personal history of radiation therapy 2016   Recurrent UTI    Stricture and stenosis of esophagus 2013    Patient Active Problem List   Diagnosis Date Noted   Rectal bleeding 04/21/2016   Family history of malignant neoplasm of breast 03/18/2015   Melanoma of skin ([place]) 03/18/2015   Breast cancer of lower-outer quadrant of left female breast ([place]) 03/15/2015   Abdominal pain, epigastric 04/16/2014   History of Nissen fundoplication [id number]   Routine health maintenance 05/04/2013   Distal radius fracture, left 12/05/2012   NECK PAIN 01/05/2010   ESOPHAGEAL STRICTURE 04/20/2008   BARRETTS ESOPHAGUS 04/20/2008   ALLERGIC RHINITIS 12/13/2007   GERD 12/13/2007   DIVERTICULOSIS, COLON 12/13/2007   MELANOMA, TRUNK, HX OF 12/13/2007   CHOLECYSTECTOMY, HX OF 07/16/2007    Past Surgical History:  Procedure  Laterality Date   BREAST LUMPECTOMY Left 2016   BREAST LUMPECTOMY WITH RADIOACTIVE SEED AND SENTINEL LYMPH NODE BIOPSY Left 03/25/2015   Procedure: LEFT BREAST LUMPECTOMY WITH RADIOACTIVE SEED AND LEFT AXILLARY SENTINEL LYMPH NODE BIOPSY;  Surgeon: Alphonsa Overall, MD;  Location: [place];  Service: General;  Laterality: Left;   CESAREAN SECTION  1992   CHOLECYSTECTOMY     COLONOSCOPY  03/25/2007   Diverticulosis    ESOPHAGOGASTRODUODENOSCOPY  04/10/2012   HH, and Stricture   LAPAROSCOPIC NISSEN FUNDOPLICATION  [id number]   Procedure: LAPAROSCOPIC NISSEN FUNDOPLICATION;  Surgeon: Pedro Earls, MD;  [facility];  Service: General;  Laterality: N/A;  HIATAL HERNIA REPAIR   MELANOMA EXCISION     back   OPEN REDUCTION INTERNAL FIXATION (ORIF) DISTAL RADIAL FRACTURE  12/05/2012   Procedure: OPEN REDUCTION INTERNAL FIXATION (ORIF) DISTAL RADIAL FRACTURE;  Surgeon: Johnny Bridge, MD;  Location: [place];  Service: Orthopedics;  Laterality: Left;    OB History   No obstetric history on file.      Home Medications    Prior to Admission medications   Medication Sig Start Date End Date Taking? Authorizing Provider  anastrozole (ARIMIDEX) 1 MG tablet Take 1 tablet (1 mg total) by mouth daily. 10/21/20  Yes Nicholas Lose, MD  Calcium Carbonate-Vitamin D (CALCIUM-VITAMIN D) 500-200 MG-UNIT per tablet Take 1 tablet by mouth 2 (  two) times daily.   Yes [provider]  pantoprazole (PROTONIX) 40 MG tablet Take 1 tablet (40 mg total) by mouth daily. 06/17/21  Yes Levin Erp, PA  predniSONE (STERAPRED UNI-PAK 21 TAB) 10 MG (21) TBPK tablet Take by mouth daily. Take 6 tabs by mouth daily  for 2 days, then 5 tabs for 2 days, then 4 tabs for 2 days, then 3 tabs for 2 days, 2 tabs for 2 days, then 1 tab by mouth daily for 2 days 06/28/21  Yes Phillip Heal, Sherlon Handing, PA-C  venlafaxine XR (EFFEXOR-XR) 75 MG 24 hr capsule TAKE 1 CAPSULE BY MOUTH EVERY DAY WITH  BREAKFAST 10/21/20  Yes Nicholas Lose, MD    Family History Family History  Problem Relation Age of Onset   Hypertension Mother    Diabetes Mother    Heart disease Mother    Lung cancer Father    Other Father        father was adopted - no paternal family history information   Diabetes Maternal Grandmother    Breast cancer Sister    Cancer Sister 25       breast (half sister - same mother)   Colon cancer Neg Hx     Social History Social History   Tobacco Use   Smoking status: Never   Smokeless tobacco: Never  Vaping Use   Vaping Use: Never used  Substance Use Topics   Alcohol use: Yes    Alcohol/week: 0.0 standard drinks    Comment: socially   Drug use: No     Allergies   Buprenex [buprenorphine] and Pseudoephedrine hcl   Review of Systems Review of Systems  Skin:  Positive for rash.  All other systems reviewed and are negative.   Physical Exam Triage Vital Signs ED Triage Vitals [06/28/21 1411]  Enc Vitals Group     BP      Pulse      Resp      Temp      Temp src      SpO2      Weight      Height      Head Circumference      Peak Flow      Pain Score 0     Pain Loc      Pain Edu?      Excl. in [place]?    No data found.  Updated Vital Signs BP 136/84 (BP Location: Left Arm)   Pulse 73   Temp 98.3 F (36.8 C) (Oral)   Resp 20   SpO2 96%   Visual Acuity Right Eye Distance:   Left Eye Distance:   Bilateral Distance:    Right Eye Near:   Left Eye Near:    Bilateral Near:     Physical Exam Vitals reviewed.  Constitutional:      General: She is not in acute distress.    Appearance: Normal appearance. She is not ill-appearing or diaphoretic.  HENT:     Head: Normocephalic and atraumatic.  Cardiovascular:     Rate and Rhythm: Normal rate and regular rhythm.     Heart sounds: Normal heart sounds.  Pulmonary:     Effort: Pulmonary effort is normal.     Breath sounds: Normal breath sounds.  Skin:    General: Skin is warm.      Comments: Forearms, legs with scattered erythematous rash with few vesicles and one blister. No facial involvement. No facial or pharyngeal swelling.  Neurological:  General: No focal deficit present.     Mental Status: She is alert and oriented to person, place, and time.  Psychiatric:        Mood and Affect: Mood normal.        Behavior: Behavior normal.        Thought Content: Thought content normal.        Judgment: Judgment normal.     UC Treatments / Results  Labs (all labs ordered are listed, but only abnormal results are displayed) Labs Reviewed - No data to display  EKG   Radiology No results found.  Procedures Procedures (including critical care time)  Medications Ordered in UC Medications  dexamethasone (DECADRON) injection 10 mg (10 mg Intramuscular Given 06/28/21 1443)    Initial Impression / Assessment and Plan / UC Course  I have reviewed the triage vital signs and the nursing notes.  Pertinent labs & imaging results that were available during my care of the patient were reviewed by me and considered in my medical decision making (see chart for details).     This patient is a very pleasant 60 y.o. year old female presenting with poison ivy rash. Decadron IM administered today. Prednisone taper sent. Benedryl for symptomatic relief. ED return precautions discussed. Patient verbalizes understanding and agreement.  .   Final Clinical Impressions(s) / UC Diagnoses   Final diagnoses:  Allergic contact dermatitis due to plants, except food     Discharge Instructions      -Prednisone taper for poison ivy, starting tomorrow. I recommend taking this in the morning as it could give you energy.  Avoid NSAIDs like ibuprofen and alleve while taking this medication as they can increase your risk of stomach upset and even GI bleeding when in combination with a steroid. You can continue tylenol (acetaminophen) up to '1000mg'$  3x daily. -Continue OTC products  including benedyrl, calamine -Seek additional medical attention if symptoms getting worse instead of better, like new facial symptoms, facial swelling, sensation of throat closing, shortness of breath.      ED Prescriptions     Medication Sig Dispense Auth. Provider   predniSONE (STERAPRED UNI-PAK 21 TAB) 10 MG (21) TBPK tablet Take by mouth daily. Take 6 tabs by mouth daily  for 2 days, then 5 tabs for 2 days, then 4 tabs for 2 days, then 3 tabs for 2 days, 2 tabs for 2 days, then 1 tab by mouth daily for 2 days 42 tablet Hazel Sams, PA-C      PDMP not reviewed this encounter.   Hazel Sams, PA-C 06/28/21 [phone]

## 2021-06-28 NOTE — Discharge Instructions (Addendum)
-  Prednisone taper for poison ivy, starting tomorrow. I recommend taking this in the morning as it could give you energy.  Avoid NSAIDs like ibuprofen and alleve while taking this medication as they can increase your risk of stomach upset and even GI bleeding when in combination with a steroid. You can continue tylenol (acetaminophen) up to '1000mg'$  3x daily. -Continue OTC products including benedyrl, calamine -Seek additional medical attention if symptoms getting worse instead of better, like new facial symptoms, facial swelling, sensation of throat closing, shortness of breath.

## 2021-06-29 ENCOUNTER — Ambulatory Visit (HOSPITAL_COMMUNITY): Payer: BC Managed Care – PPO

## 2021-07-07 ENCOUNTER — Other Ambulatory Visit: Payer: Self-pay

## 2021-07-07 ENCOUNTER — Other Ambulatory Visit: Payer: Self-pay | Admitting: Physician Assistant

## 2021-07-07 ENCOUNTER — Ambulatory Visit (HOSPITAL_COMMUNITY)
Admission: RE | Admit: 2021-07-07 | Discharge: 2021-07-07 | Disposition: A | Discharge status: Home / Self Care | Payer: BC Managed Care – PPO | Source: Ambulatory Visit | Attending: Physician Assistant | Admitting: Physician Assistant

## 2021-07-07 DIAGNOSIS — R1310 Dysphagia, unspecified: Secondary | ICD-10-CM | POA: Diagnosis not present

## 2021-07-07 DIAGNOSIS — T17320A Food in larynx causing asphyxiation, initial encounter: Secondary | ICD-10-CM | POA: Diagnosis not present

## 2021-07-25 ENCOUNTER — Encounter: Payer: Self-pay | Admitting: Gastroenterology

## 2021-07-25 ENCOUNTER — Ambulatory Visit (AMBULATORY_SURGERY_CENTER): Payer: BC Managed Care – PPO | Admitting: *Deleted

## 2021-07-25 ENCOUNTER — Other Ambulatory Visit: Payer: Self-pay

## 2021-07-25 VITALS — Ht 72.0 in | Wt 234.0 lb

## 2021-07-25 DIAGNOSIS — R1310 Dysphagia, unspecified: Secondary | ICD-10-CM

## 2021-07-25 NOTE — Progress Notes (Signed)

## 2021-08-01 ENCOUNTER — Encounter: Payer: Self-pay | Admitting: Gastroenterology

## 2021-08-01 ENCOUNTER — Other Ambulatory Visit: Payer: Self-pay

## 2021-08-01 ENCOUNTER — Ambulatory Visit (AMBULATORY_SURGERY_CENTER): Payer: BC Managed Care – PPO | Admitting: Gastroenterology

## 2021-08-01 VITALS — BP 137/76 | HR 60 | Temp 98.4°F | Resp 11 | Ht 72.0 in | Wt 234.0 lb

## 2021-08-01 DIAGNOSIS — K2950 Unspecified chronic gastritis without bleeding: Secondary | ICD-10-CM | POA: Diagnosis not present

## 2021-08-01 DIAGNOSIS — K319 Disease of stomach and duodenum, unspecified: Secondary | ICD-10-CM

## 2021-08-01 DIAGNOSIS — R1310 Dysphagia, unspecified: Secondary | ICD-10-CM | POA: Diagnosis not present

## 2021-08-01 DIAGNOSIS — K317 Polyp of stomach and duodenum: Secondary | ICD-10-CM

## 2021-08-01 DIAGNOSIS — K2100 Gastro-esophageal reflux disease with esophagitis, without bleeding: Secondary | ICD-10-CM

## 2021-08-01 DIAGNOSIS — K2970 Gastritis, unspecified, without bleeding: Secondary | ICD-10-CM | POA: Diagnosis not present

## 2021-08-01 DIAGNOSIS — R933 Abnormal findings on diagnostic imaging of other parts of digestive tract: Secondary | ICD-10-CM

## 2021-08-01 MED ORDER — SODIUM CHLORIDE 0.9 % IV SOLN: 500.0000 mL | Freq: Once | INTRAVENOUS | Status: DC

## 2021-08-01 MED ORDER — PANTOPRAZOLE SODIUM 40 MG PO TBEC: 40.0000 mg | DELAYED_RELEASE_TABLET | Freq: Two times a day (BID) | ORAL | 7 refills | Status: DC

## 2021-08-01 NOTE — Progress Notes (Signed)
Report given to PACU, vss 

## 2021-08-01 NOTE — Patient Instructions (Signed)
YOU HAD AN ENDOSCOPIC PROCEDURE TODAY AT THE [facility]:   Refer to the procedure report that was given to you for any specific questions about what was found during the examination.  If the procedure report does not answer your questions, please call your gastroenterologist to clarify.  If you requested that your care partner not be given the details of your procedure findings, then the procedure report has been included in a sealed envelope for you to review at your convenience later.  YOU SHOULD EXPECT: Some feelings of bloating in the abdomen. Passage of more gas than usual.  Walking can help get rid of the air that was put into your GI tract during the procedure and reduce the bloating. If you had a lower endoscopy (such as a colonoscopy or flexible sigmoidoscopy) you may notice spotting of blood in your stool or on the toilet paper. If you underwent a bowel prep for your procedure, you may not have a normal bowel movement for a few days.  Please Note:  You might notice some irritation and congestion in your nose or some drainage.  This is from the oxygen used during your procedure.  There is no need for concern and it should clear up in a day or so.  SYMPTOMS TO REPORT IMMEDIATELY:    Following upper endoscopy (EGD)  Vomiting of blood or coffee ground material  New chest pain or pain under the shoulder blades  Painful or persistently difficult swallowing  New shortness of breath  Fever of 100F or higher  Black, tarry-looking stools  For urgent or emergent issues, a gastroenterologist can be reached at any hour by calling [phone]. Do not use MyChart messaging for urgent concerns.    DIET:  We do recommend a small meal at first, but then you may proceed to your regular diet.  Drink plenty of fluids but you should avoid alcoholic beverages for 24 hours.  ACTIVITY:  You should plan to take it easy for the rest of today and you should NOT DRIVE or use heavy machinery  until tomorrow (because of the sedation medicines used during the test).    FOLLOW UP: Our staff will call the number listed on your records 48-72 hours following your procedure to check on you and address any questions or concerns that you may have regarding the information given to you following your procedure. If we do not reach you, we will leave a message.  We will attempt to reach you two times.  During this call, we will ask if you have developed any symptoms of COVID 19. If you develop any symptoms (ie: fever, flu-like symptoms, shortness of breath, cough etc.) before then, please call [phone].  If you test positive for Covid 19 in the 2 weeks post procedure, please call and report this information to us.    If any biopsies were taken you will be contacted by phone or by letter within the next 1-3 weeks.  Please call us at [phone] if you have not heard about the biopsies in 3 weeks.    SIGNATURES/CONFIDENTIALITY: You and/or your care partner have signed paperwork which will be entered into your electronic medical record.  These signatures attest to the fact that that the information above on your After Visit Summary has been reviewed and is understood.  Full responsibility of the confidentiality of this discharge information lies with you and/or your care-partner. 

## 2021-08-01 NOTE — Progress Notes (Signed)
History & Physical  Primary Care Physician:  Michael Boston, MD Primary Gastroenterologist: Jerilynn Mages. Fuller Plan, MD  CHIEF COMPLAINT: Dysphagia  HPI: Wanda Moore is a 60 y.o. female with dysphagia, GERD, status post fundoplication for EGD with possible dilation.  See 06/17/2021 office note for additional information.   Past Medical History:  Diagnosis Date   Allergy    Anxiety    Barrett esophagus 2008   Breast cancer [facility])    Breast cancer of lower-outer quadrant of left female breast ([place]) 03/15/2015   Cough 08-03-2012    nonproductive   Distal radius fracture, left 12/05/2012   Diverticulosis of colon (without mention of hemorrhage)    Esophageal reflux    Hiatal hernia 2013   large   Melanoma ([place])    removed from betwen shoulder blades   Personal history of radiation therapy 2016   Recurrent UTI    Stricture and stenosis of esophagus 2013    Past Surgical History:  Procedure Laterality Date   BREAST LUMPECTOMY Left 2016   BREAST LUMPECTOMY WITH RADIOACTIVE SEED AND SENTINEL LYMPH NODE BIOPSY Left 03/25/2015   Procedure: LEFT BREAST LUMPECTOMY WITH RADIOACTIVE SEED AND LEFT AXILLARY SENTINEL LYMPH NODE BIOPSY;  Surgeon: Alphonsa Overall, MD;  Location: [place];  Service: General;  Laterality: Left;   [place]     COLONOSCOPY  03/25/2007   Diverticulosis    ESOPHAGOGASTRODUODENOSCOPY  04/10/2012   HH, and Stricture   LAPAROSCOPIC NISSEN FUNDOPLICATION  89/01/8100   Procedure: LAPAROSCOPIC NISSEN FUNDOPLICATION;  Surgeon: Pedro Earls, MD;  [facility];  Service: General;  Laterality: N/A;  HIATAL HERNIA REPAIR   MELANOMA EXCISION     back   OPEN REDUCTION INTERNAL FIXATION (ORIF) DISTAL RADIAL FRACTURE  12/05/2012   Procedure: OPEN REDUCTION INTERNAL FIXATION (ORIF) DISTAL RADIAL FRACTURE;  Surgeon: Johnny Bridge, MD;  Location: [place];  Service: Orthopedics;  Laterality: Left;    Prior to  Admission medications   Medication Sig Start Date End Date Taking? Authorizing Provider  anastrozole (ARIMIDEX) 1 MG tablet Take 1 tablet (1 mg total) by mouth daily. 10/21/20  Yes Nicholas Lose, MD  Calcium Carbonate-Vitamin D (CALCIUM-VITAMIN D) 500-200 MG-UNIT per tablet Take 1 tablet by mouth 2 (two) times daily.   Yes [provider]  pantoprazole (PROTONIX) 40 MG tablet Take 1 tablet (40 mg total) by mouth daily. 06/17/21  Yes Levin Erp, PA  venlafaxine XR (EFFEXOR-XR) 75 MG 24 hr capsule TAKE 1 CAPSULE BY MOUTH EVERY DAY WITH BREAKFAST 10/21/20  Yes Nicholas Lose, MD  VITAMIN D PO Take by mouth.   Yes [provider]    Current Outpatient Medications  Medication Sig Dispense Refill   anastrozole (ARIMIDEX) 1 MG tablet Take 1 tablet (1 mg total) by mouth daily. 90 tablet 3   Calcium Carbonate-Vitamin D (CALCIUM-VITAMIN D) 500-200 MG-UNIT per tablet Take 1 tablet by mouth 2 (two) times daily.     pantoprazole (PROTONIX) 40 MG tablet Take 1 tablet (40 mg total) by mouth daily. 90 tablet 3   venlafaxine XR (EFFEXOR-XR) 75 MG 24 hr capsule TAKE 1 CAPSULE BY MOUTH EVERY DAY WITH BREAKFAST 90 capsule 3   VITAMIN D PO Take by mouth.     Current Facility-Administered Medications  Medication Dose Route Frequency Provider Last Rate Last Admin   0.9 %  sodium chloride infusion  500 mL Intravenous Once Ladene Artist, MD  Allergies as of 08/01/2021 - Review Complete 08/01/2021  Allergen Reaction Noted   Buprenex [buprenorphine] Itching 08/09/2012   Pseudoephedrine hcl Itching 12/18/2018    Family History  Problem Relation Age of Onset   Hypertension Mother    Diabetes Mother    Heart disease Mother    Lung cancer Father    Other Father        father was adopted - no paternal family history information   Breast cancer Sister    Cancer Sister 47       breast (half sister - same mother)   Diabetes Maternal Grandmother    Colon cancer Neg Hx     Esophageal cancer Neg Hx    Stomach cancer Neg Hx     Social History   Socioeconomic History   Marital status: Married    Spouse name: Not on file   Number of children: 2   Years of education: 14   Highest education level: Not on file  Occupational History   Occupation: adm Clinical cytogeneticist: [employer]  Tobacco Use   Smoking status: Never   Smokeless tobacco: Never  Vaping Use   Vaping Use: Never used  Substance and Sexual Activity   Alcohol use: Yes    Alcohol/week: 2.0 - 3.0 standard drinks    Types: 2 - 3 Standard drinks or equivalent per week    Comment: socially   Drug use: No   Sexual activity: Yes  Other Topics Concern   Not on file  Social History Narrative   HSG, [employer] - 2 years. Married 1990. 2 dtrs - '92, '94. Marriage in good health.   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   Feels safe at home and denies abuse            Social Determinants of Health   Financial Resource Strain: Not on file  Food Insecurity: Not on file  Transportation Needs: Not on file  Physical Activity: Not on file  Stress: Not on file  Social Connections: Not on file  Intimate Partner Violence: Not on file    Review of Systems:  All systems reviewed an negative except where noted in HPI.  Gen: Denies any fever, chills, sweats, anorexia, fatigue, weakness, malaise, weight loss, and sleep disorder CV: Denies chest pain, angina, palpitations, syncope, orthopnea, PND, peripheral edema, and claudication. Resp: Denies dyspnea at rest, dyspnea with exercise, cough, sputum, wheezing, coughing up blood, and pleurisy. GI: Denies vomiting blood, jaundice, and fecal incontinence.   Denies dysphagia or odynophagia. GU : Denies urinary burning, blood in urine, urinary frequency, urinary hesitancy, nocturnal urination, and urinary incontinence. MS: Denies joint pain, limitation of  movement, and swelling, stiffness, low back pain, extremity pain. Denies muscle weakness, cramps, atrophy.  Derm: Denies rash, itching, dry skin, hives, moles, warts, or unhealing ulcers.  Psych: Denies depression, anxiety, memory loss, suicidal ideation, hallucinations, paranoia, and confusion. Heme: Denies bruising, bleeding, and enlarged lymph nodes. Neuro:  Denies any headaches, dizziness, paresthesias. Endo:  Denies any problems with DM, thyroid, adrenal function.   Physical Exam: General:  Alert, well-developed, in NAD Head:  Normocephalic and atraumatic. Eyes:  Sclera clear, no icterus.   Conjunctiva pink. Ears:  Normal auditory acuity. Mouth:  No deformity or lesions.  Neck:  Supple; no masses . Lungs:  Clear throughout to auscultation.   No wheezes, crackles, or rhonchi. No acute distress.  Heart:  Regular rate and rhythm; no murmurs. Abdomen:  Soft, nondistended, nontender. No masses, hepatomegaly. No obvious masses.  Normal bowel .    Rectal:  Deferred   Msk:  Symmetrical without gross deformities.. Pulses:  Normal pulses noted. Extremities:  Without edema. Neurologic:  Alert and  oriented x4;  grossly normal neurologically. Skin:  Intact without significant lesions or rashes. Cervical Nodes:  No significant cervical adenopathy. Psych:  Alert and cooperative. Normal mood and affect.  Impression / Plan:   Dysphagia, GERD.  For EGD possible dilation.   This patient is appropriate for endoscopic procedures in the ambulatory setting.    Pricilla Riffle. Fuller Plan  08/01/2021, 9:52 AM

## 2021-08-01 NOTE — Progress Notes (Signed)
0943 Robinul 0.1 mg IV given due large amount of secretions upon assessment.  MD made aware, vss 

## 2021-08-01 NOTE — Op Note (Signed)
[place] Patient Name: Wanda Moore Procedure Date: 08/01/2021 9:40 AM MRN: [id number] Endoscopist: Ladene Artist , MD Age: 60 Referring MD:  Date of Birth: [birth year] Gender: Female Account #: [id number] Procedure:                Upper GI endoscopy Indications:              Dysphagia, Abnormal esophagram Medicines:                Monitored Anesthesia Care Procedure:                Pre-Anesthesia Assessment:                           - Prior to the procedure, a History and Physical                            was performed, and patient medications and                            allergies were reviewed. The patient's tolerance of                            previous anesthesia was also reviewed. The risks                            and benefits of the procedure and the sedation                            options and risks were discussed with the patient.                            All questions were answered, and informed consent                            was obtained. Prior Anticoagulants: The patient has                            taken no previous anticoagulant or antiplatelet                            agents. ASA Grade Assessment: II - A patient with                            mild systemic disease. After reviewing the risks                            and benefits, the patient was deemed in                            satisfactory condition to undergo the procedure.                           After obtaining informed consent, the endoscope was  passed under direct vision. Throughout the                            procedure, the patient's blood pressure, pulse, and                            oxygen saturations were monitored continuously. The                            Endoscope was introduced through the mouth, and                            advanced to the second part of duodenum. The upper                            GI endoscopy was  accomplished without difficulty.                            The patient tolerated the procedure well. Scope In: Scope Out: Findings:                 LA Grade A (one or more mucosal breaks less than 5                            mm, not extending between tops of 2 mucosal folds)                            esophagitis with no bleeding was found at the                            gastroesophageal junction.                           The Z-line was variable and was found 40 cm from                            the incisors. Biopsies were taken with a cold                            forceps for histology.                           No endoscopic abnormality was evident in the                            esophagus to explain the patient's complaint of                            dysphagia. It was decided, however, to proceed with                            dilation of the entire esophagus. A guidewire was  placed and the scope was withdrawn. Dilation was                            performed with a Savary dilator with no resistance                            at 18 mm. Small heme on the dilator.                           A small hiatal hernia was present.                           Evidence of a Nissen fundoplication was found in                            the cardia and in the gastric fundus. The wrap                            appeared loose. This was traversed.                           A few 4 to 7 mm sessile polyps with no bleeding and                            no stigmata of recent bleeding were found in the                            gastric fundus and in the gastric body. Biopsies                            were taken with a cold forceps for histology.                           A single 6 mm mucosal papule (nodule) with no                            bleeding and no stigmata of recent bleeding was                            found in the gastric antrum. Biopsies were taken                             with a cold forceps for histology.                           The exam of the stomach was otherwise normal.                           The duodenal bulb and second portion of the                            duodenum were normal. Complications:  No immediate complications. Estimated Blood Loss:     Estimated blood loss was minimal. Impression:               - LA Grade A reflux esophagitis with no bleeding.                           - Z-line variable, 40 cm from the incisors.                            Biopsied.                           - No endoscopic esophageal abnormality to explain                            patient's dysphagia. Esophagus dilated.                           - Small hiatal hernia.                           - Prior fundoplication.                           - A few gastric polyps. Biopsied.                           - A single mucosal papule (nodule) found in the                            stomach. Biopsied.                           - Normal duodenal bulb and second portion of the                            duodenum. Recommendation:           - Patient has a contact number available for                            emergencies. The signs and symptoms of potential                            delayed complications were discussed with the                            patient. Return to normal activities tomorrow.                            Written discharge instructions were provided to the                            patient.                           - Clear liquid diet for 2 hours, then advance as  tolerated to soft diet today.                           - Resume prior diet tomorrow.                           - Follow antireflux measures.                           - Continue present medications.                           - Increase pantoprazole to 40 mg po bid, 1 year of                            refills.                            - Await pathology results.                           - GI office appt in 6 weeks. Ladene Artist, MD 08/01/2021 10:21:51 AM This report has been signed electronically.

## 2021-08-01 NOTE — Progress Notes (Signed)
Pt's states no medical or surgical changes since previsit or office visit.   Check-in-LINDA  V/S-DT

## 2021-08-03 ENCOUNTER — Telehealth: Payer: Self-pay

## 2021-08-03 NOTE — Telephone Encounter (Signed)
  Follow up Call-  Call back number 08/01/2021 12/18/2018  Post procedure Call Back phone  # [phone]  Permission to leave phone message Yes Yes  Some recent data might be hidden     Patient questions:  Do you have a fever, pain , or abdominal swelling? No. Pain Score  0 *  Have you tolerated food without any problems? Yes.    Have you been able to return to your normal activities? Yes.    Do you have any questions about your discharge instructions: Diet   No. Medications  No. Follow up visit  No.  Do you have questions or concerns about your Care? No.  Actions: * If pain score is 4 or above: No action needed, pain <4.  Have you developed a fever since your procedure? no  2.   Have you had an respiratory symptoms (SOB or cough) since your procedure? no  3.   Have you tested positive for COVID 19 since your procedure no  4.   Have you had any family members/close contacts diagnosed with the COVID 19 since your procedure?  no   If yes to any of these questions please route to Joylene John, RN and Joella Prince, RN

## 2021-08-17 ENCOUNTER — Encounter: Payer: Self-pay | Admitting: Gastroenterology

## 2021-10-05 ENCOUNTER — Other Ambulatory Visit: Payer: Self-pay

## 2021-10-05 ENCOUNTER — Other Ambulatory Visit: Payer: Self-pay | Admitting: Internal Medicine

## 2021-10-05 ENCOUNTER — Ambulatory Visit
Admission: RE | Admit: 2021-10-05 | Discharge: 2021-10-05 | Disposition: A | Discharge status: Home / Self Care | Payer: BC Managed Care – PPO | Source: Ambulatory Visit | Attending: Internal Medicine | Admitting: Internal Medicine

## 2021-10-05 DIAGNOSIS — J929 Pleural plaque without asbestos: Secondary | ICD-10-CM | POA: Diagnosis not present

## 2021-10-05 DIAGNOSIS — J90 Pleural effusion, not elsewhere classified: Secondary | ICD-10-CM | POA: Diagnosis not present

## 2021-10-05 DIAGNOSIS — R0602 Shortness of breath: Secondary | ICD-10-CM

## 2021-10-05 DIAGNOSIS — I4891 Unspecified atrial fibrillation: Secondary | ICD-10-CM | POA: Diagnosis not present

## 2021-10-05 DIAGNOSIS — I509 Heart failure, unspecified: Secondary | ICD-10-CM | POA: Diagnosis not present

## 2021-10-05 DIAGNOSIS — Z853 Personal history of malignant neoplasm of breast: Secondary | ICD-10-CM | POA: Diagnosis not present

## 2021-10-05 DIAGNOSIS — J189 Pneumonia, unspecified organism: Secondary | ICD-10-CM | POA: Diagnosis not present

## 2021-10-05 MED ORDER — IOPAMIDOL (ISOVUE-370) INJECTION 76%
75.0000 mL | Freq: Once | INTRAVENOUS | Status: AC | PRN
  Administered 2021-10-05: 75 mL via INTRAVENOUS

## 2021-10-06 NOTE — Progress Notes (Signed)
Primary Care Physician: Michael Boston, MD Primary Cardiologist: none Primary Electrophysiologist: none Referring Physician: Dr Delma Post Wanda Moore is a 60 y.o. female with a history of new onset atrial fibrillation who presents for consultation in the [facility]. The patient was initially diagnosed with atrial fibrillation 10/05/21 after presenting to her PCP with symptoms of SOB. ECG showed afib with rapid rates. She was started on metoprolol for rate control. Patient has a CHADS2VASC score of 1. She was sent by her PCP for a CT to rule out PE which was negative. It did show small pleural effusions and peribrochovascular ground glass in lower lobes, consistent with CHF. Today she reports that she had been fatigued for two months leading up to this diagnosis. She does have orthopnea.   Today, she denies symptoms of palpitations, chest pain, PND, lower extremity edema, dizziness, presyncope, syncope, snoring, daytime somnolence, bleeding, or neurologic sequela. The patient is tolerating medications without difficulties and is otherwise without complaint today.    Atrial Fibrillation Risk Factors:  she does not have symptoms or diagnosis of sleep apnea. she does not have a history of rheumatic fever. she does not have a history of alcohol use. The patient does not have a history of early familial atrial fibrillation or other arrhythmias.  she has a BMI of Body mass index is 31.71 kg/m.Marland Kitchen Filed Weights   10/07/21 0853  Weight: 106.1 kg    Family History  Problem Relation Age of Onset   Hypertension Mother    Diabetes Mother    Heart disease Mother    Lung cancer Father    Other Father        father was adopted - no paternal family history information   Breast cancer Sister    Cancer Sister 76       breast (half sister - same mother)   Diabetes Maternal Grandmother    Colon cancer Neg Hx    Esophageal cancer Neg Hx    Stomach cancer Neg Hx       Atrial Fibrillation Management history:  Previous antiarrhythmic drugs: none Previous cardioversions: none Previous ablations: none CHADS2VASC score: 2 Anticoagulation history: none   Past Medical History:  Diagnosis Date   Allergy    Anxiety    Barrett esophagus 2008   Breast cancer ([place])    Breast cancer of lower-outer quadrant of left female breast ([place]) 03/15/2015   Cough 08-03-2012    nonproductive   Distal radius fracture, left 12/05/2012   Diverticulosis of colon (without mention of hemorrhage)    Esophageal reflux    Hiatal hernia 2013   large   Melanoma ([place])    removed from betwen shoulder blades   Personal history of radiation therapy 2016   Recurrent UTI    Stricture and stenosis of esophagus 2013   Past Surgical History:  Procedure Laterality Date   BREAST LUMPECTOMY Left 2016   BREAST LUMPECTOMY WITH RADIOACTIVE SEED AND SENTINEL LYMPH NODE BIOPSY Left 03/25/2015   Procedure: LEFT BREAST LUMPECTOMY WITH RADIOACTIVE SEED AND LEFT AXILLARY SENTINEL LYMPH NODE BIOPSY;  Surgeon: Alphonsa Overall, MD;  Location: [facility];  Service: General;  Laterality: Left;   CESAREAN SECTION  1992   CHOLECYSTECTOMY     COLONOSCOPY  03/25/2007   Diverticulosis    ESOPHAGOGASTRODUODENOSCOPY  04/10/2012   HH, and Stricture   LAPAROSCOPIC NISSEN FUNDOPLICATION  81/0/1751   Procedure: LAPAROSCOPIC NISSEN FUNDOPLICATION;  Surgeon: Pedro Earls, MD;  Location: [facility];  Service: General;  Laterality: N/A;  HIATAL HERNIA REPAIR   MELANOMA EXCISION     back   OPEN REDUCTION INTERNAL FIXATION (ORIF) DISTAL RADIAL FRACTURE  12/05/2012   Procedure: OPEN REDUCTION INTERNAL FIXATION (ORIF) DISTAL RADIAL FRACTURE;  Surgeon: Johnny Bridge, MD;  Location: [facility];  Service: Orthopedics;  Laterality: Left;    Current Outpatient Medications  Medication Sig Dispense Refill   anastrozole (ARIMIDEX) 1 MG tablet Take 1 tablet (1 mg total) by mouth daily.  90 tablet 3   Calcium Carbonate-Vitamin D (CALCIUM-VITAMIN D) 500-200 MG-UNIT per tablet Take 1 tablet by mouth 2 (two) times daily.     metoprolol succinate (TOPROL-XL) 50 MG 24 hr tablet Take 50 mg by mouth daily.     pantoprazole (PROTONIX) 40 MG tablet Take 1 tablet (40 mg total) by mouth 2 (two) times daily. 90 tablet 7   venlafaxine XR (EFFEXOR-XR) 75 MG 24 hr capsule TAKE 1 CAPSULE BY MOUTH EVERY DAY WITH BREAKFAST 90 capsule 3   VITAMIN D PO Take by mouth.     No current facility-administered medications for this encounter.    Allergies  Allergen Reactions   Buprenex [Buprenorphine] Itching   Pseudoephedrine Hcl Itching    Social History   Socioeconomic History   Marital status: Married    Spouse name: Not on file   Number of children: 2   Years of education: 14   Highest education level: Not on file  Occupational History   Occupation: adm assistance    Employer: [employer]  Tobacco Use   Smoking status: Never   Smokeless tobacco: Never  Vaping Use   Vaping Use: Never used  Substance and Sexual Activity   Alcohol use: Yes    Alcohol/week: 2.0 - 3.0 standard drinks    Types: 2 - 3 Standard drinks or equivalent per week    Comment: weekends 4-6 10/07/21   Drug use: No   Sexual activity: Yes  Other Topics Concern   Not on file  Social History Narrative   HSG, [employer] - 2 years. Married 1990. 2 dtrs - '92, '94. Marriage in good health.   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   Feels safe at home and denies abuse            Social Determinants of Health   Financial Resource Strain: Not on file  Food Insecurity: Not on file  Transportation Needs: Not on file  Physical Activity: Not on file  Stress: Not on file  Social Connections: Not on file  Intimate Partner Violence: Not on file     ROS- All systems are reviewed and negative except as per the HPI  above.  Physical Exam: Vitals:   10/07/21 0853  BP: (!) 150/100  Pulse: (!) 121  Weight: 106.1 kg  Height: 6' (1.829 m)    GEN- The patient is a well appearing obese female, alert and oriented x 3 today.   Head- normocephalic, atraumatic Eyes-  Sclera clear, conjunctiva pink Ears- hearing intact Oropharynx- clear Neck- supple  Lungs- Clear to ausculation bilaterally, normal work of breathing Heart- irregular rate and rhythm, no murmurs, rubs or gallops  GI- soft, NT, ND, + BS Extremities- no clubbing, cyanosis, or edema MS- no significant deformity or atrophy Skin- no rash or lesion Psych- euthymic mood, full affect Neuro- strength and sensation are intact  Wt  Readings from Last 3 Encounters:  10/07/21 106.1 kg  08/01/21 106.1 kg  07/25/21 106.1 kg    EKG today demonstrates  Afib Vent. rate 121 BPM PR interval * ms QRS duration 82 ms QT/QTcB 298/423 ms   Epic records are reviewed at length today  CHA2DS2-VASc Score = 2  The patient's score is based upon: CHF History: 1 HTN History: 0 Diabetes History: 0 Stroke History: 0 Vascular Disease History: 0 Age Score: 0 Gender Score: 1      ASSESSMENT AND PLAN: 1. Persistent Atrial Fibrillation (ICD10:  I48.19) The patient's CHA2DS2-VASc score is 2, indicating a 2.2% annual risk of stroke.   General education about afib provided and questions answered. We also discussed her stroke risk and the risks and benefits of anticoagulation. Will plan for DCCV after 3 weeks of anticoagulation.  Start Eliquis 5 mg BID Increase Toprol to 75 mg daily Check echocardiogram  2. Obesity Body mass index is 31.71 kg/m. Lifestyle modification was discussed at length including regular exercise and weight reduction.  3. CHF Noted on CT, patient having symptoms of SOB and orthopnea . Echo as above. Start Lasix 20 mg daily x 3 days with KCL 10 meq. Check bmet and BNP today.    Follow up in the AF clinic in one week.     [facility] [address] [place], [place] [address] [phone] 10/07/2021 9:14 AM

## 2021-10-07 ENCOUNTER — Ambulatory Visit (HOSPITAL_COMMUNITY)
Admission: RE | Admit: 2021-10-07 | Discharge: 2021-10-07 | Disposition: A | Discharge status: Home / Self Care | Payer: BC Managed Care – PPO | Source: Ambulatory Visit | Attending: Physician Assistant | Admitting: Physician Assistant

## 2021-10-07 ENCOUNTER — Encounter (HOSPITAL_COMMUNITY): Payer: Self-pay | Admitting: Physician Assistant

## 2021-10-07 ENCOUNTER — Telehealth (HOSPITAL_COMMUNITY): Payer: Self-pay

## 2021-10-07 VITALS — BP 150/100 | HR 121 | Ht 72.0 in | Wt 233.8 lb

## 2021-10-07 DIAGNOSIS — I4819 Other persistent atrial fibrillation: Secondary | ICD-10-CM

## 2021-10-07 DIAGNOSIS — I509 Heart failure, unspecified: Secondary | ICD-10-CM | POA: Insufficient documentation

## 2021-10-07 DIAGNOSIS — Z6831 Body mass index (BMI) 31.0-31.9, adult: Secondary | ICD-10-CM | POA: Insufficient documentation

## 2021-10-07 DIAGNOSIS — E669 Obesity, unspecified: Secondary | ICD-10-CM | POA: Insufficient documentation

## 2021-10-07 LAB — BASIC METABOLIC PANEL
Anion gap: 6 (ref 5–15)
BUN: 13 mg/dL (ref 8–23)
CO2: 25 mmol/L (ref 22–32)
Calcium: 9.2 mg/dL (ref 8.9–10.3)
Chloride: 109 mmol/L (ref 98–111)
Creatinine, Ser: 0.6 mg/dL (ref 0.44–1.00)
GFR, Estimated: >60 mL/min (ref >60)
Glucose, Bld: 96 mg/dL (ref 70–99)
Potassium: 3.9 mmol/L (ref 3.5–5.1)
Sodium: 140 mmol/L (ref 135–145)

## 2021-10-07 LAB — BRAIN NATRIURETIC PEPTIDE: B Natriuretic Peptide: 272.4 pg/mL — ABNORMAL HIGH (ref 0.0–100.0)

## 2021-10-07 MED ORDER — APIXABAN 5 MG PO TABS: 5.0000 mg | ORAL_TABLET | Freq: Two times a day (BID) | ORAL | 3 refills | Status: DC

## 2021-10-07 MED ORDER — METOPROLOL SUCCINATE ER 50 MG PO TB24: 75.0000 mg | ORAL_TABLET | Freq: Every day | ORAL | 3 refills | Status: DC

## 2021-10-07 MED ORDER — FUROSEMIDE 20 MG PO TABS: 20.0000 mg | ORAL_TABLET | Freq: Every day | ORAL | 1 refills | Status: DC | PRN

## 2021-10-07 MED ORDER — POTASSIUM CHLORIDE CRYS ER 10 MEQ PO TBCR: 10.0000 meq | EXTENDED_RELEASE_TABLET | Freq: Every day | ORAL | 1 refills | Status: DC | PRN

## 2021-10-07 NOTE — Patient Instructions (Signed)
Start Eliquis 5mg  twice a day   Increase metoprolol to 75mg  once a day (1 and 1/2 of your 50mg  tablets)  Start Lasix 20mg  once a day for the next 3 days then only as needed for swelling or weight gain  Start Kdur (potassium) 29meq once a day when lasix is taken  Cardioversion scheduled for Friday, December 23rd  - Arrive at the [facility] and go to admitting at [facility] not eat or drink anything after midnight the night prior to your procedure.  - Take all your morning medication (except diabetic medications) with a sip of water prior to arrival.  - You will not be able to drive home after your procedure.  - Do NOT miss any doses of your blood thinner - if you should miss a dose please notify our office immediately.  - If you feel as if you go back into normal rhythm prior to scheduled cardioversion, please notify our office immediately. If your procedure is canceled in the cardioversion suite you will be charged a cancellation fee. Patients will be asked to: to mask in public and hand hygiene (no longer quarantine) in the 3 days prior to surgery, to report if any COVID-19-like illness or household contacts to COVID-19 to determine need for testing

## 2021-10-07 NOTE — Telephone Encounter (Signed)
Contacted AIM services through [facility] for date of service 10/17/21 does not require precert.  Phone # : [phone]

## 2021-10-12 ENCOUNTER — Ambulatory Visit (HOSPITAL_COMMUNITY): Payer: BC Managed Care – PPO | Admitting: Physician Assistant

## 2021-10-14 ENCOUNTER — Ambulatory Visit (HOSPITAL_COMMUNITY)
Admission: RE | Admit: 2021-10-14 | Discharge: 2021-10-14 | Disposition: A | Discharge status: Home / Self Care | Payer: BC Managed Care – PPO | Source: Ambulatory Visit | Attending: Physician Assistant | Admitting: Physician Assistant

## 2021-10-14 ENCOUNTER — Encounter (HOSPITAL_COMMUNITY): Payer: Self-pay | Admitting: Physician Assistant

## 2021-10-14 ENCOUNTER — Other Ambulatory Visit: Payer: Self-pay

## 2021-10-14 VITALS — BP 118/82 | HR 130 | Ht 72.0 in | Wt 230.0 lb

## 2021-10-14 DIAGNOSIS — Z6831 Body mass index (BMI) 31.0-31.9, adult: Secondary | ICD-10-CM | POA: Diagnosis not present

## 2021-10-14 DIAGNOSIS — I509 Heart failure, unspecified: Secondary | ICD-10-CM | POA: Insufficient documentation

## 2021-10-14 DIAGNOSIS — E669 Obesity, unspecified: Secondary | ICD-10-CM | POA: Diagnosis not present

## 2021-10-14 DIAGNOSIS — I4819 Other persistent atrial fibrillation: Secondary | ICD-10-CM | POA: Insufficient documentation

## 2021-10-14 LAB — CBC
HCT: 37.9 % (ref 36.0–46.0)
Hemoglobin: 12.5 g/dL (ref 12.0–15.0)
MCH: 28.5 pg (ref 26.0–34.0)
MCHC: 33 g/dL (ref 30.0–36.0)
MCV: 86.3 fL (ref 80.0–100.0)
Platelets: 270 10*3/uL (ref 150–400)
RBC: 4.39 MIL/uL (ref 3.87–5.11)
RDW: 12.8 % (ref 11.5–15.5)
WBC: 7.2 10*3/uL (ref 4.0–10.5)
nRBC: 0 % (ref 0.0–0.2)

## 2021-10-14 LAB — BASIC METABOLIC PANEL
Anion gap: 10 (ref 5–15)
BUN: 14 mg/dL (ref 8–23)
CO2: 23 mmol/L (ref 22–32)
Calcium: 9 mg/dL (ref 8.9–10.3)
Chloride: 107 mmol/L (ref 98–111)
Creatinine, Ser: 0.68 mg/dL (ref 0.44–1.00)
GFR, Estimated: >60 mL/min (ref >60)
Glucose, Bld: 104 mg/dL — ABNORMAL HIGH (ref 70–99)
Potassium: 4.8 mmol/L (ref 3.5–5.1)
Sodium: 140 mmol/L (ref 135–145)

## 2021-10-14 MED ORDER — METOPROLOL SUCCINATE ER 50 MG PO TB24: 50.0000 mg | ORAL_TABLET | Freq: Two times a day (BID) | ORAL | 3 refills | Status: DC

## 2021-10-14 NOTE — Progress Notes (Signed)
Primary Care Physician: Michael Boston, MD Primary Cardiologist: none Primary Electrophysiologist: none Referring Physician: Dr Delma Post Wanda Moore is a 60 y.o. female with a history of new onset atrial fibrillation who presents for follow up in the [facility]. The patient was initially diagnosed with atrial fibrillation 10/05/21 after presenting to her PCP with symptoms of SOB. ECG showed afib with rapid rates. She was started on metoprolol for rate control. Patient has a CHADS2VASC score of 1. She was sent by her PCP for a CT to rule out PE which was negative. It did show small pleural effusions and peribrochovascular ground glass in lower lobes, consistent with CHF. She reports that she had been fatigued for two months leading up to this diagnosis. She also reported orthopnea.   On follow up today, patient reports that her orthopnea has resolved and her weight is down about 4 lbs. She does still have symptoms of heart racing. No bleeding issues on anticoagulation.   Today, she denies symptoms of chest pain, PND, lower extremity edema, orthopnea, dizziness, presyncope, syncope, snoring, daytime somnolence, bleeding, or neurologic sequela. The patient is tolerating medications without difficulties and is otherwise without complaint today.    Atrial Fibrillation Risk Factors:  she does not have symptoms or diagnosis of sleep apnea. she does not have a history of rheumatic fever. she does not have a history of alcohol use. The patient does not have a history of early familial atrial fibrillation or other arrhythmias.  she has a BMI of Body mass index is 31.19 kg/m.[place] Kitchen Filed Weights   10/14/21 1027  Weight: 104.3 kg     Family History  Problem Relation Age of Onset   Hypertension Mother    Diabetes Mother    Heart disease Mother    Lung cancer Father    Other Father        father was adopted - no paternal family history information   Breast  cancer Sister    Cancer Sister 17       breast (half sister - same mother)   Diabetes Maternal Grandmother    Colon cancer Neg Hx    Esophageal cancer Neg Hx    Stomach cancer Neg Hx      Atrial Fibrillation Management history:  Previous antiarrhythmic drugs: none Previous cardioversions: none Previous ablations: none CHADS2VASC score: 2 Anticoagulation history: Eliquis   Past Medical History:  Diagnosis Date   Allergy    Anxiety    Barrett esophagus 2008   Breast cancer ([place])    Breast cancer of lower-outer quadrant of left female breast ([place]) 03/15/2015   Cough 08-03-2012    nonproductive   Distal radius fracture, left 12/05/2012   Diverticulosis of colon (without mention of hemorrhage)    Esophageal reflux    Hiatal hernia 2013   large   Melanoma ([place])    removed from betwen shoulder blades   Personal history of radiation therapy 2016   Recurrent UTI    Stricture and stenosis of esophagus 2013   Past Surgical History:  Procedure Laterality Date   BREAST LUMPECTOMY Left 2016   BREAST LUMPECTOMY WITH RADIOACTIVE SEED AND SENTINEL LYMPH NODE BIOPSY Left 03/25/2015   Procedure: LEFT BREAST LUMPECTOMY WITH RADIOACTIVE SEED AND LEFT AXILLARY SENTINEL LYMPH NODE BIOPSY;  Surgeon: Alphonsa Overall, MD;  Location: [facility];  Service: General;  Laterality: Left;   [place]  03/25/2007   Diverticulosis    ESOPHAGOGASTRODUODENOSCOPY  04/10/2012   HH, and Stricture   LAPAROSCOPIC NISSEN FUNDOPLICATION  40/07/8118   Procedure: LAPAROSCOPIC NISSEN FUNDOPLICATION;  Surgeon: Pedro Earls, MD;  Location: [facility];  Service: General;  Laterality: N/A;  HIATAL HERNIA REPAIR   MELANOMA EXCISION     back   OPEN REDUCTION INTERNAL FIXATION (ORIF) DISTAL RADIAL FRACTURE  12/05/2012   Procedure: OPEN REDUCTION INTERNAL FIXATION (ORIF) DISTAL RADIAL FRACTURE;  Surgeon: Johnny Bridge, MD;  Location: [facility];   Service: Orthopedics;  Laterality: Left;    Current Outpatient Medications  Medication Sig Dispense Refill   anastrozole (ARIMIDEX) 1 MG tablet Take 1 tablet (1 mg total) by mouth daily. 90 tablet 3   apixaban (ELIQUIS) 5 MG TABS tablet Take 1 tablet (5 mg total) by mouth 2 (two) times daily. 60 tablet 3   Calcium Carbonate-Vitamin D (CALCIUM-VITAMIN D) 500-200 MG-UNIT per tablet Take 1 tablet by mouth 2 (two) times daily.     furosemide (LASIX) 20 MG tablet Take 1 tablet (20 mg total) by mouth daily as needed (swelling/wt gain). 10 tablet 1   metoprolol succinate (TOPROL-XL) 50 MG 24 hr tablet Take 1.5 tablets (75 mg total) by mouth daily. 45 tablet 3   pantoprazole (PROTONIX) 40 MG tablet Take 1 tablet (40 mg total) by mouth 2 (two) times daily. 90 tablet 7   potassium chloride (KLOR-CON M) 10 MEQ tablet Take 1 tablet (10 mEq total) by mouth daily as needed (when lasix is taken). 10 tablet 1   venlafaxine XR (EFFEXOR-XR) 75 MG 24 hr capsule TAKE 1 CAPSULE BY MOUTH EVERY DAY WITH BREAKFAST 90 capsule 3   VITAMIN D PO Take by mouth.     No current facility-administered medications for this encounter.    Allergies  Allergen Reactions   Buprenex [Buprenorphine] Itching   Pseudoephedrine Hcl Itching    Social History   Socioeconomic History   Marital status: Married    Spouse name: Not on file   Number of children: 2   Years of education: 14   Highest education level: Not on file  Occupational History   Occupation: adm assistance    Employer: [employer]  Tobacco Use   Smoking status: Never   Smokeless tobacco: Never  Vaping Use   Vaping Use: Never used  Substance and Sexual Activity   Alcohol use: Not Currently    Alcohol/week: 2.0 - 3.0 standard drinks    Types: 2 - 3 Standard drinks or equivalent per week    Comment: weekends 4-6 10/07/21   Drug use: No   Sexual activity: Yes  Other Topics Concern   Not on file  Social History Narrative   HSG, [employer] - 2  years. Married 1990. 2 dtrs - '92, '94. Marriage in good health.   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   Feels safe at home and denies abuse            Social Determinants of Health   Financial Resource Strain: Not on file  Food Insecurity: Not on file  Transportation Needs: Not on file  Physical Activity: Not on file  Stress: Not on file  Social Connections: Not on file  Intimate Partner Violence: Not on file     ROS- All systems are reviewed and negative except as per the HPI above.  Physical Exam: Vitals:  10/14/21 1027  BP: 118/82  Pulse: (!) 130  Weight: 104.3 kg  Height: 6' (1.829 m)    GEN- The patient is a well appearing obese female, alert and oriented x 3 today.   HEENT-head normocephalic, atraumatic, sclera clear, conjunctiva pink, hearing intact, trachea midline. Lungs- Clear to ausculation bilaterally, normal work of breathing Heart- irregular rate and rhythm, tachycardia, no murmurs, rubs or gallops  GI- soft, NT, ND, + BS Extremities- no clubbing, cyanosis, or edema MS- no significant deformity or atrophy Skin- no rash or lesion Psych- euthymic mood, full affect Neuro- strength and sensation are intact   Wt Readings from Last 3 Encounters:  10/14/21 104.3 kg  10/07/21 106.1 kg  08/01/21 106.1 kg    EKG today demonstrates  Afib with RVR Vent. rate 130 BPM PR interval * ms QRS duration 78 ms QT/QTcB 326/479 ms   Epic records are reviewed at length today  CHA2DS2-VASc Score = 2  The patient's score is based upon: CHF History: 1 HTN History: 0 Diabetes History: 0 Stroke History: 0 Vascular Disease History: 0 Age Score: 0 Gender Score: 1      ASSESSMENT AND PLAN: 1. Persistent Atrial Fibrillation (ICD10:  I48.19) The patient's CHA2DS2-VASc score is 2, indicating a 2.2% annual risk of stroke.   Patient remains in rapid afib.  Will plan  for DCCV after 3 weeks of anticoagulation. Check bmet/cbc. Continue Eliquis 5 mg BID Increase Toprol to 100 mg daily Echocardiogram scheduled. Hopefully, increasing rate control today will allow [place] to get good pictures.   2. Obesity Body mass index is 31.19 kg/m. Lifestyle modification was discussed and encouraged including regular physical activity and weight reduction.  3. CHF Echo scheduled 10/17/21. Her weight is down 4 lbs and her orthopnea and SOB have resolved.  Continue Lasix 20 mg daily with KCL 10 meq PRN for weight gain.    Follow up in the AF clinic post DCCV.    [facility] [address] [place], [place] [address] [phone] 10/14/2021 10:33 AM

## 2021-10-14 NOTE — H&P (View-Only) (Signed)
Primary Care Physician: Michael Boston, MD Primary Cardiologist: none Primary Electrophysiologist: none Referring Physician: Dr Delma Post Wanda Moore is a 60 y.o. female with a history of new onset atrial fibrillation who presents for follow up in the [facility]. The patient was initially diagnosed with atrial fibrillation 10/05/21 after presenting to her PCP with symptoms of SOB. ECG showed afib with rapid rates. She was started on metoprolol for rate control. Patient has a CHADS2VASC score of 1. She was sent by her PCP for a CT to rule out PE which was negative. It did show small pleural effusions and peribrochovascular ground glass in lower lobes, consistent with CHF. She reports that she had been fatigued for two months leading up to this diagnosis. She also reported orthopnea.   On follow up today, patient reports that her orthopnea has resolved and her weight is down about 4 lbs. She does still have symptoms of heart racing. No bleeding issues on anticoagulation.   Today, she denies symptoms of chest pain, PND, lower extremity edema, orthopnea, dizziness, presyncope, syncope, snoring, daytime somnolence, bleeding, or neurologic sequela. The patient is tolerating medications without difficulties and is otherwise without complaint today.    Atrial Fibrillation Risk Factors:  she does not have symptoms or diagnosis of sleep apnea. she does not have a history of rheumatic fever. she does not have a history of alcohol use. The patient does not have a history of early familial atrial fibrillation or other arrhythmias.  she has a BMI of Body mass index is 31.19 kg/m.[place] Kitchen Filed Weights   10/14/21 1027  Weight: 104.3 kg     Family History  Problem Relation Age of Onset   Hypertension Mother    Diabetes Mother    Heart disease Mother    Lung cancer Father    Other Father        father was adopted - no paternal family history information   Breast  cancer Sister    Cancer Sister 53       breast (half sister - same mother)   Diabetes Maternal Grandmother    Colon cancer Neg Hx    Esophageal cancer Neg Hx    Stomach cancer Neg Hx      Atrial Fibrillation Management history:  Previous antiarrhythmic drugs: none Previous cardioversions: none Previous ablations: none CHADS2VASC score: 2 Anticoagulation history: Eliquis   Past Medical History:  Diagnosis Date   Allergy    Anxiety    Barrett esophagus 2008   Breast cancer ([place])    Breast cancer of lower-outer quadrant of left female breast ([place]) 03/15/2015   Cough 08-03-2012    nonproductive   Distal radius fracture, left 12/05/2012   Diverticulosis of colon (without mention of hemorrhage)    Esophageal reflux    Hiatal hernia 2013   large   Melanoma ([place])    removed from betwen shoulder blades   Personal history of radiation therapy 2016   Recurrent UTI    Stricture and stenosis of esophagus 2013   Past Surgical History:  Procedure Laterality Date   BREAST LUMPECTOMY Left 2016   BREAST LUMPECTOMY WITH RADIOACTIVE SEED AND SENTINEL LYMPH NODE BIOPSY Left 03/25/2015   Procedure: LEFT BREAST LUMPECTOMY WITH RADIOACTIVE SEED AND LEFT AXILLARY SENTINEL LYMPH NODE BIOPSY;  Surgeon: Alphonsa Overall, MD;  Location: [facility];  Service: General;  Laterality: Left;   [place]  03/25/2007   Diverticulosis    ESOPHAGOGASTRODUODENOSCOPY  04/10/2012   HH, and Stricture   LAPAROSCOPIC NISSEN FUNDOPLICATION  86/05/6719   Procedure: LAPAROSCOPIC NISSEN FUNDOPLICATION;  Surgeon: Pedro Earls, MD;  [facility];  Service: General;  Laterality: N/A;  HIATAL HERNIA REPAIR   MELANOMA EXCISION     back   OPEN REDUCTION INTERNAL FIXATION (ORIF) DISTAL RADIAL FRACTURE  12/05/2012   Procedure: OPEN REDUCTION INTERNAL FIXATION (ORIF) DISTAL RADIAL FRACTURE;  Surgeon: Johnny Bridge, MD;  Location: [facility];   Service: Orthopedics;  Laterality: Left;    Current Outpatient Medications  Medication Sig Dispense Refill   anastrozole (ARIMIDEX) 1 MG tablet Take 1 tablet (1 mg total) by mouth daily. 90 tablet 3   apixaban (ELIQUIS) 5 MG TABS tablet Take 1 tablet (5 mg total) by mouth 2 (two) times daily. 60 tablet 3   Calcium Carbonate-Vitamin D (CALCIUM-VITAMIN D) 500-200 MG-UNIT per tablet Take 1 tablet by mouth 2 (two) times daily.     furosemide (LASIX) 20 MG tablet Take 1 tablet (20 mg total) by mouth daily as needed (swelling/wt gain). 10 tablet 1   metoprolol succinate (TOPROL-XL) 50 MG 24 hr tablet Take 1.5 tablets (75 mg total) by mouth daily. 45 tablet 3   pantoprazole (PROTONIX) 40 MG tablet Take 1 tablet (40 mg total) by mouth 2 (two) times daily. 90 tablet 7   potassium chloride (KLOR-CON M) 10 MEQ tablet Take 1 tablet (10 mEq total) by mouth daily as needed (when lasix is taken). 10 tablet 1   venlafaxine XR (EFFEXOR-XR) 75 MG 24 hr capsule TAKE 1 CAPSULE BY MOUTH EVERY DAY WITH BREAKFAST 90 capsule 3   VITAMIN D PO Take by mouth.     No current facility-administered medications for this encounter.    Allergies  Allergen Reactions   Buprenex [Buprenorphine] Itching   Pseudoephedrine Hcl Itching    Social History   Socioeconomic History   Marital status: Married    Spouse name: Not on file   Number of children: 2   Years of education: 14   Highest education level: Not on file  Occupational History   Occupation: adm assistance    Employer: [employer]  Tobacco Use   Smoking status: Never   Smokeless tobacco: Never  Vaping Use   Vaping Use: Never used  Substance and Sexual Activity   Alcohol use: Not Currently    Alcohol/week: 2.0 - 3.0 standard drinks    Types: 2 - 3 Standard drinks or equivalent per week    Comment: weekends 4-6 10/07/21   Drug use: No   Sexual activity: Yes  Other Topics Concern   Not on file  Social History Narrative   HSG, [employer] - 2  years. Married 1990. 2 dtrs - '92, '94. Marriage in good health.   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   Feels safe at home and denies abuse            Social Determinants of Health   Financial Resource Strain: Not on file  Food Insecurity: Not on file  Transportation Needs: Not on file  Physical Activity: Not on file  Stress: Not on file  Social Connections: Not on file  Intimate Partner Violence: Not on file     ROS- All systems are reviewed and negative except as per the HPI above.  Physical Exam: Vitals:  10/14/21 1027  BP: 118/82  Pulse: (!) 130  Weight: 104.3 kg  Height: 6' (1.829 m)    GEN- The patient is a well appearing obese female, alert and oriented x 3 today.   HEENT-head normocephalic, atraumatic, sclera clear, conjunctiva pink, hearing intact, trachea midline. Lungs- Clear to ausculation bilaterally, normal work of breathing Heart- irregular rate and rhythm, tachycardia, no murmurs, rubs or gallops  GI- soft, NT, ND, + BS Extremities- no clubbing, cyanosis, or edema MS- no significant deformity or atrophy Skin- no rash or lesion Psych- euthymic mood, full affect Neuro- strength and sensation are intact   Wt Readings from Last 3 Encounters:  10/14/21 104.3 kg  10/07/21 106.1 kg  08/01/21 106.1 kg    EKG today demonstrates  Afib with RVR Vent. rate 130 BPM PR interval * ms QRS duration 78 ms QT/QTcB 326/479 ms   Epic records are reviewed at length today  CHA2DS2-VASc Score = 2  The patient's score is based upon: CHF History: 1 HTN History: 0 Diabetes History: 0 Stroke History: 0 Vascular Disease History: 0 Age Score: 0 Gender Score: 1      ASSESSMENT AND PLAN: 1. Persistent Atrial Fibrillation (ICD10:  I48.19) The patient's CHA2DS2-VASc score is 2, indicating a 2.2% annual risk of stroke.   Patient remains in rapid afib.  Will plan  for DCCV after 3 weeks of anticoagulation. Check bmet/cbc. Continue Eliquis 5 mg BID Increase Toprol to 100 mg daily Echocardiogram scheduled. Hopefully, increasing rate control today will allow [place] to get good pictures.   2. Obesity Body mass index is 31.19 kg/m. Lifestyle modification was discussed and encouraged including regular physical activity and weight reduction.  3. CHF Echo scheduled 10/17/21. Her weight is down 4 lbs and her orthopnea and SOB have resolved.  Continue Lasix 20 mg daily with KCL 10 meq PRN for weight gain.    Follow up in the AF clinic post DCCV.    [facility] [address] [place], [place] [address] [phone] 10/14/2021 10:33 AM

## 2021-10-14 NOTE — Patient Instructions (Addendum)
Increase metoprolol to 50mg  twice a day  Cardioversion scheduled for Friday, December 23rd             - Arrive at the [facility] and go to admitting at [facility] not eat or drink anything after midnight the night prior to your procedure.             - Take all your morning medication (except diabetic medications) with a sip of water prior to arrival.             - You will not be able to drive home after your procedure.             - Do NOT miss any doses of your blood thinner - if you should miss a dose please notify our office immediately.             - If you feel as if you go back into normal rhythm prior to scheduled cardioversion, please notify our office immediately. If your procedure is canceled in the cardioversion suite you will be charged a cancellation fee. Patients will be asked to: to mask in public and hand hygiene (no longer quarantine) in the 3 days prior to surgery, to report if any COVID-19-like illness or household contacts to COVID-19 to determine need for testing

## 2021-10-17 ENCOUNTER — Other Ambulatory Visit: Payer: Self-pay

## 2021-10-17 ENCOUNTER — Encounter (HOSPITAL_COMMUNITY): Payer: Self-pay | Admitting: Internal Medicine

## 2021-10-17 ENCOUNTER — Ambulatory Visit (HOSPITAL_COMMUNITY)
Admission: RE | Admit: 2021-10-17 | Discharge: 2021-10-17 | Disposition: A | Discharge status: Home / Self Care | Payer: BC Managed Care – PPO | Source: Ambulatory Visit | Attending: Physician Assistant | Admitting: Physician Assistant

## 2021-10-17 DIAGNOSIS — I4891 Unspecified atrial fibrillation: Secondary | ICD-10-CM | POA: Insufficient documentation

## 2021-10-17 DIAGNOSIS — I358 Other nonrheumatic aortic valve disorders: Secondary | ICD-10-CM | POA: Insufficient documentation

## 2021-10-17 DIAGNOSIS — I4819 Other persistent atrial fibrillation: Secondary | ICD-10-CM | POA: Insufficient documentation

## 2021-10-17 DIAGNOSIS — I340 Nonrheumatic mitral (valve) insufficiency: Secondary | ICD-10-CM | POA: Insufficient documentation

## 2021-10-17 LAB — ECHOCARDIOGRAM COMPLETE
MV M vel: 4.53 m/s
MV Peak grad: 82.1 mmHg
S' Lateral: 4.7 cm

## 2021-10-18 ENCOUNTER — Other Ambulatory Visit: Payer: Self-pay | Admitting: Hematology and Oncology

## 2021-10-18 DIAGNOSIS — C50512 Malignant neoplasm of lower-outer quadrant of left female breast: Secondary | ICD-10-CM
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
## 2021-10-21 ENCOUNTER — Inpatient Hospital Stay: Payer: BC Managed Care – PPO | Attending: Hematology and Oncology | Admitting: Hematology and Oncology

## 2021-10-21 ENCOUNTER — Other Ambulatory Visit: Payer: Self-pay

## 2021-10-21 DIAGNOSIS — C50512 Malignant neoplasm of lower-outer quadrant of left female breast: Secondary | ICD-10-CM | POA: Diagnosis not present

## 2021-10-21 DIAGNOSIS — R232 Flushing: Secondary | ICD-10-CM | POA: Diagnosis not present

## 2021-10-21 DIAGNOSIS — Z170 Estrogen receptor positive status [ER+]: Secondary | ICD-10-CM | POA: Insufficient documentation

## 2021-10-21 DIAGNOSIS — I4891 Unspecified atrial fibrillation: Secondary | ICD-10-CM | POA: Insufficient documentation

## 2021-10-21 MED ORDER — ANASTROZOLE 1 MG PO TABS: 1.0000 mg | ORAL_TABLET | Freq: Every day | ORAL | 3 refills | Status: DC

## 2021-10-28 ENCOUNTER — Ambulatory Visit (HOSPITAL_COMMUNITY): Payer: BC Managed Care – PPO | Admitting: Certified Registered Nurse Anesthetist

## 2021-10-28 ENCOUNTER — Encounter (HOSPITAL_COMMUNITY): Payer: Self-pay | Admitting: Internal Medicine

## 2021-10-28 ENCOUNTER — Ambulatory Visit (HOSPITAL_COMMUNITY)
Admission: RE | Admit: 2021-10-28 | Discharge: 2021-10-28 | Disposition: A | Discharge status: Home / Self Care | Payer: BC Managed Care – PPO | Attending: Internal Medicine | Admitting: Internal Medicine

## 2021-10-28 ENCOUNTER — Encounter (HOSPITAL_COMMUNITY)
Admission: RE | Disposition: A | Discharge status: Home / Self Care | Payer: BC Managed Care – PPO | Source: Home / Self Care | Attending: Internal Medicine

## 2021-10-28 ENCOUNTER — Other Ambulatory Visit: Payer: Self-pay

## 2021-10-28 DIAGNOSIS — E669 Obesity, unspecified: Secondary | ICD-10-CM | POA: Insufficient documentation

## 2021-10-28 DIAGNOSIS — I509 Heart failure, unspecified: Secondary | ICD-10-CM | POA: Diagnosis not present

## 2021-10-28 DIAGNOSIS — Z79899 Other long term (current) drug therapy: Secondary | ICD-10-CM | POA: Diagnosis not present

## 2021-10-28 DIAGNOSIS — F419 Anxiety disorder, unspecified: Secondary | ICD-10-CM | POA: Diagnosis not present

## 2021-10-28 DIAGNOSIS — Z6831 Body mass index (BMI) 31.0-31.9, adult: Secondary | ICD-10-CM | POA: Diagnosis not present

## 2021-10-28 DIAGNOSIS — Z7901 Long term (current) use of anticoagulants: Secondary | ICD-10-CM | POA: Diagnosis not present

## 2021-10-28 DIAGNOSIS — I4891 Unspecified atrial fibrillation: Secondary | ICD-10-CM | POA: Diagnosis not present

## 2021-10-28 DIAGNOSIS — K219 Gastro-esophageal reflux disease without esophagitis: Secondary | ICD-10-CM | POA: Diagnosis not present

## 2021-10-28 DIAGNOSIS — I4819 Other persistent atrial fibrillation: Secondary | ICD-10-CM | POA: Diagnosis not present

## 2021-10-28 DIAGNOSIS — J309 Allergic rhinitis, unspecified: Secondary | ICD-10-CM | POA: Diagnosis not present

## 2021-10-28 HISTORY — PX: CARDIOVERSION: SHX1299

## 2021-10-28 SURGERY — CARDIOVERSION
Anesthesia: General

## 2021-10-28 MED ORDER — SODIUM CHLORIDE 0.9 % IV SOLN: INTRAVENOUS | Status: DC | PRN

## 2021-10-28 MED ORDER — SODIUM CHLORIDE 0.9 % IV SOLN: INTRAVENOUS | Status: DC

## 2021-10-28 MED ORDER — PROPOFOL 500 MG/50ML IV EMUL: INTRAVENOUS | Status: DC | PRN

## 2021-10-28 MED ORDER — LIDOCAINE 2% (20 MG/ML) 5 ML SYRINGE
INTRAMUSCULAR | Status: DC | PRN
  Administered 2021-10-28: 100 mg via INTRAVENOUS

## 2021-10-28 MED ORDER — PROPOFOL 10 MG/ML IV BOLUS
INTRAVENOUS | Status: DC | PRN
  Administered 2021-10-28: 60 mg via INTRAVENOUS
  Administered 2021-10-28: 40 mg via INTRAVENOUS

## 2021-10-28 NOTE — Interval H&P Note (Signed)
History and Physical Interval Note:  10/28/2021 10:29 AM  Wanda Moore  has presented today for surgery, with the diagnosis of AFIB.  The various methods of treatment have been discussed with the patient and family. After consideration of risks, benefits and other options for treatment, the patient has consented to  Procedure(s): CARDIOVERSION (N/A) as a surgical intervention.  The patient's history has been reviewed, patient examined, no change in status, stable for surgery.  I have reviewed the patient's chart and labs.  Questions were answered to the patient's satisfaction.     Dayven Linsley A Nadav Swindell

## 2021-10-28 NOTE — CV Procedure (Signed)
° °  Electrical Cardioversion Procedure Note Wanda Moore [id number] 04/30/1961  Procedure: Electrical Cardioversion Indications:  Atrial Fibrillation  Time Out: Verified patient identification, verified procedure,medications/allergies/relevent history reviewed, required imaging and test results available.  Performed  Procedure Details  The patient was NPO after midnight. Anesthesia was administered at the beside  by Dr.Hodierne with 60mg  of lidocaine and 100 mg propofol.  Cardioversion was done with synchronized biphasic defibrillation with AP pads with 200 Joules.  The patient converted to normal sinus rhythm. The patient tolerated the procedure well   IMPRESSION:  Successful cardioversion of atrial fibrillation    Wanda Moore A Wanda Moore 10/28/2021, 10:41 AM

## 2021-10-28 NOTE — Anesthesia Preprocedure Evaluation (Signed)
Anesthesia Evaluation  Patient identified by MRN, date of birth, ID band Patient awake    Reviewed: Allergy & Precautions, H&P , NPO status , Patient's Chart, lab work & pertinent test results  Airway Mallampati: II   Neck ROM: full    Dental   Pulmonary neg pulmonary ROS,    breath sounds clear to auscultation       Cardiovascular + dysrhythmias  Rhythm:regular Rate:Normal     Neuro/Psych PSYCHIATRIC DISORDERS Anxiety    GI/Hepatic hiatal hernia, GERD  ,  Endo/Other    Renal/GU      Musculoskeletal   Abdominal   Peds  Hematology   Anesthesia Other Findings   Reproductive/Obstetrics                             Anesthesia Physical Anesthesia Plan  ASA: 3  Anesthesia Plan: General   Post-op Pain Management:    Induction: Intravenous  PONV Risk Score and Plan: 3 and Propofol infusion and Treatment may vary due to age or medical condition  Airway Management Planned: Mask  Additional Equipment:   Intra-op Plan:   Post-operative Plan:   Informed Consent: I have reviewed the patients History and Physical, chart, labs and discussed the procedure including the risks, benefits and alternatives for the proposed anesthesia with the patient or authorized representative who has indicated his/her understanding and acceptance.     Dental advisory given  Plan Discussed with: Anesthesiologist and CRNA  Anesthesia Plan Comments:         Anesthesia Quick Evaluation

## 2021-10-28 NOTE — Transfer of Care (Signed)
Immediate Anesthesia Transfer of Care Note  Patient: Wanda Moore  Procedure(s) Performed: CARDIOVERSION  Patient Location: Endoscopy Unit  Anesthesia Type:General  Level of Consciousness: drowsy and patient cooperative  Airway & Oxygen Therapy: Patient Spontanous Breathing and Patient connected to face mask oxygen  Post-op Assessment: Report given to RN and Post -op Vital signs reviewed and stable  Post vital signs: Reviewed and stable  Last Vitals:  Vitals Value Taken Time  BP 103/82   Temp    Pulse 78   Resp 15   SpO2 95     Last Pain:  Vitals:   10/28/21 1007  TempSrc: Temporal  PainSc: 0-No pain         Complications: No notable events documented.

## 2021-10-28 NOTE — Anesthesia Procedure Notes (Signed)
Procedure Name: General with mask airway Date/Time: 10/28/2021 10:36 AM Performed by: Dorthea Cove, CRNA Pre-anesthesia Checklist: Patient identified, Emergency Drugs available, Suction available, Patient being monitored and Timeout performed Patient Re-evaluated:Patient Re-evaluated prior to induction Oxygen Delivery Method: Simple face mask Preoxygenation: Pre-oxygenation with 100% oxygen Induction Type: IV induction Ventilation: Mask ventilation without difficulty Placement Confirmation: positive ETCO2 and CO2 detector Dental Injury: Teeth and Oropharynx as per pre-operative assessment

## 2021-10-30 NOTE — Anesthesia Postprocedure Evaluation (Signed)
Anesthesia Post Note  Patient: Wanda Moore  Procedure(s) Performed: CARDIOVERSION     Patient location during evaluation: Endoscopy Anesthesia Type: General Level of consciousness: awake and alert Pain management: pain level controlled Vital Signs Assessment: post-procedure vital signs reviewed and stable Respiratory status: spontaneous breathing, nonlabored ventilation, respiratory function stable and patient connected to nasal cannula oxygen Cardiovascular status: blood pressure returned to baseline and stable Postop Assessment: no apparent nausea or vomiting Anesthetic complications: no   No notable events documented.  Last Vitals:  Vitals:   10/28/21 1057 10/28/21 1105  BP: 104/70 116/87  Pulse: 69 67  Resp: 12 13  Temp:    SpO2: 93% 97%    Last Pain:  Vitals:   10/28/21 1105  TempSrc:   PainSc: 0-No pain                 Buddy Loeffelholz S

## 2021-10-31 ENCOUNTER — Encounter (HOSPITAL_COMMUNITY): Payer: Self-pay | Admitting: Internal Medicine

## 2021-11-02 ENCOUNTER — Telehealth (HOSPITAL_COMMUNITY): Payer: Self-pay | Admitting: *Deleted

## 2021-11-02 MED ORDER — METOPROLOL SUCCINATE ER 50 MG PO TB24: 75.0000 mg | ORAL_TABLET | Freq: Two times a day (BID) | ORAL | 3 refills | Status: DC

## 2021-11-02 NOTE — Telephone Encounter (Signed)
Patient called in stating she went back into AF this afternoon her HR is running in the 150s. Discussed with Adline Peals PA will increase metoprolol to 75mg  BID and follow up tomorrow for assessment. Pt in agreement.

## 2021-11-03 ENCOUNTER — Ambulatory Visit (HOSPITAL_COMMUNITY)
Admission: RE | Admit: 2021-11-03 | Discharge: 2021-11-03 | Disposition: A | Discharge status: Home / Self Care | Payer: BC Managed Care – PPO | Source: Ambulatory Visit | Attending: Physician Assistant | Admitting: Physician Assistant

## 2021-11-03 ENCOUNTER — Encounter (HOSPITAL_COMMUNITY): Payer: Self-pay | Admitting: Physician Assistant

## 2021-11-03 ENCOUNTER — Other Ambulatory Visit: Payer: Self-pay

## 2021-11-03 VITALS — BP 110/90 | HR 149 | Ht 72.0 in | Wt 220.0 lb

## 2021-11-03 DIAGNOSIS — I4819 Other persistent atrial fibrillation: Secondary | ICD-10-CM | POA: Insufficient documentation

## 2021-11-03 DIAGNOSIS — Z79899 Other long term (current) drug therapy: Secondary | ICD-10-CM | POA: Insufficient documentation

## 2021-11-03 DIAGNOSIS — Z7901 Long term (current) use of anticoagulants: Secondary | ICD-10-CM | POA: Diagnosis not present

## 2021-11-03 DIAGNOSIS — I340 Nonrheumatic mitral (valve) insufficiency: Secondary | ICD-10-CM | POA: Diagnosis not present

## 2021-11-03 DIAGNOSIS — I5020 Unspecified systolic (congestive) heart failure: Secondary | ICD-10-CM | POA: Diagnosis not present

## 2021-11-03 MED ORDER — AMIODARONE HCL 200 MG PO TABS: ORAL_TABLET | ORAL | 0 refills | Status: DC

## 2021-11-03 NOTE — Patient Instructions (Signed)
Start Amiodarone 200mg  twice a day until follow up  Cardioversion scheduled for Friday, January 20th  - Arrive at the [facility] and go to admitting at 830AM  - Do not eat or drink anything after midnight the night prior to your procedure.  - Take all your morning medication (except diabetic medications) with a sip of water prior to arrival.  - You will not be able to drive home after your procedure.  - Do NOT miss any doses of your blood thinner - if you should miss a dose please notify our office immediately.  - If you feel as if you go back into normal rhythm prior to scheduled cardioversion, please notify our office immediately. If your procedure is canceled in the cardioversion suite you will be charged a cancellation fee. Patients will be asked to: to mask in public and hand hygiene (no longer quarantine) in the 3 days prior to surgery, to report if any COVID-19-like illness or household contacts to COVID-19 to determine need for testing

## 2021-11-03 NOTE — Progress Notes (Signed)
Primary Care Physician: Michael Boston, MD Primary Cardiologist: none Primary Electrophysiologist: none Referring Physician: Dr Delma Post Wanda Moore is a 60 y.o. female with a history of new onset atrial fibrillation who presents for follow up in the [facility]. The patient was initially diagnosed with atrial fibrillation 10/05/21 after presenting to her PCP with symptoms of SOB. ECG showed afib with rapid rates. She was started on metoprolol for rate control. Patient has a CHADS2VASC score of 1. She was sent by her PCP for a CT to rule out PE which was negative. It did show small pleural effusions and peribrochovascular ground glass in lower lobes, consistent with CHF. She reports that she had been fatigued for two months leading up to this diagnosis. She also reported orthopnea.   On follow up today, patient is s/p DCCV on 10/28/21. Unfortunately, she was back in afib 11/02/21 with heart racing and fatigue. Her BB was increased. Echo showed EF 30-35%, moderate-severe MR. While in SR she felt "great". No symptoms of fluid overload.   Today, she denies symptoms of chest pain, PND, lower extremity edema, orthopnea, dizziness, presyncope, syncope, snoring, daytime somnolence, bleeding, or neurologic sequela. The patient is tolerating medications without difficulties and is otherwise without complaint today.    Atrial Fibrillation Risk Factors:  she does not have symptoms or diagnosis of sleep apnea. she does not have a history of rheumatic fever. she does not have a history of alcohol use. The patient does not have a history of early familial atrial fibrillation or other arrhythmias.  she has a BMI of Body mass index is 29.84 kg/m.Marland Kitchen Filed Weights   11/03/21 1124  Weight: 99.8 kg      Family History  Problem Relation Age of Onset   Hypertension Mother    Diabetes Mother    Heart disease Mother    Lung cancer Father    Other Father         father was adopted - no paternal family history information   Breast cancer Sister    Cancer Sister 64       breast (half sister - same mother)   Diabetes Maternal Grandmother    Colon cancer Neg Hx    Esophageal cancer Neg Hx    Stomach cancer Neg Hx      Atrial Fibrillation Management history:  Previous antiarrhythmic drugs: none Previous cardioversions: 10/28/21 Previous ablations: none CHADS2VASC score: 2 Anticoagulation history: Eliquis   Past Medical History:  Diagnosis Date   Allergy    Anxiety    Barrett esophagus 2008   Breast cancer ([place])    Breast cancer of lower-outer quadrant of left female breast ([place]) 03/15/2015   Cough 08/03/2012   nonproductive   Distal radius fracture, left 12/05/2012   Diverticulosis of colon (without mention of hemorrhage)    Esophageal reflux    Hiatal hernia 2013   large   Melanoma ([place])    removed from betwen shoulder blades   Personal history of radiation therapy 2016   Recurrent UTI    Stricture and stenosis of esophagus 2013   Past Surgical History:  Procedure Laterality Date   BREAST LUMPECTOMY Left 2016   BREAST LUMPECTOMY WITH RADIOACTIVE SEED AND SENTINEL LYMPH NODE BIOPSY Left 03/25/2015   Procedure: LEFT BREAST LUMPECTOMY WITH RADIOACTIVE SEED AND LEFT AXILLARY SENTINEL LYMPH NODE BIOPSY;  Surgeon: Alphonsa Overall, MD;  Location: [facility];  Service: General;  Laterality: Left;   CARDIOVERSION  N/A 10/28/2021   Procedure: CARDIOVERSION;  Surgeon: Werner Lean, MD;  Location: [facility];  Service: Cardiovascular;  Laterality: N/A;   [place]     COLONOSCOPY  03/25/2007   Diverticulosis    ESOPHAGOGASTRODUODENOSCOPY  04/10/2012   HH, and Stricture   LAPAROSCOPIC NISSEN FUNDOPLICATION  40/07/7352   Procedure: LAPAROSCOPIC NISSEN FUNDOPLICATION;  Surgeon: Pedro Earls, MD;  Location: [facility];  Service: General;  Laterality: N/A;  HIATAL HERNIA REPAIR   MELANOMA  EXCISION     back   OPEN REDUCTION INTERNAL FIXATION (ORIF) DISTAL RADIAL FRACTURE  12/05/2012   Procedure: OPEN REDUCTION INTERNAL FIXATION (ORIF) DISTAL RADIAL FRACTURE;  Surgeon: Johnny Bridge, MD;  Location: [facility];  Service: Orthopedics;  Laterality: Left;    Current Outpatient Medications  Medication Sig Dispense Refill   amiodarone (PACERONE) 200 MG tablet Take 1 tablet by mouth twice a day for 1 month then reduce to 1 tablet daily 60 tablet 0   anastrozole (ARIMIDEX) 1 MG tablet Take 1 tablet (1 mg total) by mouth daily. 90 tablet 3   apixaban (ELIQUIS) 5 MG TABS tablet Take 1 tablet (5 mg total) by mouth 2 (two) times daily. 60 tablet 3   Calcium Carbonate-Vitamin D (CALCIUM-VITAMIN D) 500-200 MG-UNIT per tablet Take 1 tablet by mouth 2 (two) times daily.     furosemide (LASIX) 20 MG tablet Take 1 tablet (20 mg total) by mouth daily as needed (swelling/wt gain). 10 tablet 1   metoprolol succinate (TOPROL-XL) 50 MG 24 hr tablet Take 1.5 tablets (75 mg total) by mouth 2 (two) times daily. 60 tablet 3   pantoprazole (PROTONIX) 40 MG tablet Take 1 tablet (40 mg total) by mouth 2 (two) times daily. 90 tablet 7   potassium chloride (KLOR-CON M) 10 MEQ tablet Take 1 tablet (10 mEq total) by mouth daily as needed (when lasix is taken). 10 tablet 1   venlafaxine XR (EFFEXOR-XR) 75 MG 24 hr capsule TAKE 1 CAPSULE BY MOUTH EVERY DAY WITH BREAKFAST 90 capsule 3   VITAMIN D PO Take 5,000 Units by mouth daily.     No current facility-administered medications for this encounter.    Allergies  Allergen Reactions   Buprenex [Buprenorphine] Itching   Pseudoephedrine Hcl Itching    Social History   Socioeconomic History   Marital status: Married    Spouse name: Not on file   Number of children: 2   Years of education: 14   Highest education level: Not on file  Occupational History   Occupation: adm assistance    Employer: [employer]  Tobacco Use   Smoking  status: Never   Smokeless tobacco: Never  Vaping Use   Vaping Use: Never used  Substance and Sexual Activity   Alcohol use: Not Currently    Alcohol/week: 2.0 - 3.0 standard drinks    Types: 2 - 3 Standard drinks or equivalent per week    Comment: weekends 4-6 10/07/21   Drug use: No   Sexual activity: Yes  Other Topics Concern   Not on file  Social History Narrative   HSG, [employer] - 2 years. Married 1990. 2 dtrs - '92, '94. Marriage in good health.   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   Feels safe at home and denies abuse  Social Determinants of Health   Financial Resource Strain: Not on file  Food Insecurity: Not on file  Transportation Needs: Not on file  Physical Activity: Not on file  Stress: Not on file  Social Connections: Not on file  Intimate Partner Violence: Not on file     ROS- All systems are reviewed and negative except as per the HPI above.  Physical Exam: Vitals:   11/03/21 1124  BP: 110/90  Pulse: (!) 149  Weight: 99.8 kg  Height: 6' (1.829 m)    GEN- The patient is a well appearing female, alert and oriented x 3 today.   HEENT-head normocephalic, atraumatic, sclera clear, conjunctiva pink, hearing intact, trachea midline. Lungs- Clear to ausculation bilaterally, normal work of breathing Heart- irregular rate and rhythm, tachycardia, no murmurs, rubs or gallops  GI- soft, NT, ND, + BS Extremities- no clubbing, cyanosis, or edema MS- no significant deformity or atrophy Skin- no rash or lesion Psych- euthymic mood, full affect Neuro- strength and sensation are intact   Wt Readings from Last 3 Encounters:  11/03/21 99.8 kg  10/28/21 101.2 kg  10/21/21 103.2 kg    EKG today demonstrates  Afib RVR Vent. rate 149 BPM PR interval * ms QRS duration 76 ms QT/QTcB 282/444 ms  Echo 10/17/21 demonstrated  1. Left ventricular ejection  fraction, by estimation, is 30 to 35%. The  left ventricle has moderately decreased function. The left ventricle  demonstrates global hypokinesis. The left ventricular internal cavity size was moderately dilated. Left ventricular diastolic parameters are indeterminate.   2. Right ventricular systolic function is mildly reduced. The right  ventricular size is normal. There is normal pulmonary artery systolic  pressure.   3. Mitral regurgitation is significant with jets directed both posterior  and centrally Would recomm TEE to further define mechanism, severity. Moderate to severe mitral valve regurgitation.   4. The aortic valve is tricuspid. Aortic valve regurgitation is not  visualized. Aortic valve sclerosis is present, with no evidence of aortic  valve stenosis.   5. Aortic dilatation noted. There is mild dilatation of the ascending  aorta, measuring 39 mm.   6. The inferior vena cava is dilated in size with <50% respiratory  variability, suggesting right atrial pressure of 15 mmHg.    Epic records are reviewed at length today  CHA2DS2-VASc Score = 2  The patient's score is based upon: CHF History: 1 HTN History: 0 Diabetes History: 0 Stroke History: 0 Vascular Disease History: 0 Age Score: 0 Gender Score: 1       ASSESSMENT AND PLAN: 1. Persistent Atrial Fibrillation (ICD10:  I48.19) The patient's CHA2DS2-VASc score is 2, indicating a 2.2% annual risk of stroke.   S/p DCCV on 10/28/21 with early return of afib.  Will plan on amiodarone short term to get back in SR and then recheck valvular status and EF with TEE after a couple months.  Start amiodarone 200 mg BID for 4 weeks, then decrease to daily. Will plan on repeat DCCV in 3 weeks after amiodarone loading. If her valve improves with SR, will refer to EP to consider ablation for long-term rhythm management. If valve remains severe in SR, will refer to structural team. Continue Eliquis 5 mg BID Continue Toprol 75 mg  BID Will need Cmet/cbc/TSH prior to DCCV.  2. Valvular heart disease  Moderate-severe MR Will check TEE once back in SR If MR persists, will refer to structural heart team.   3. HFrEF EF 30-35% Appears euvolemic, has PRN  lasix for weight gain/edema. Will need repeat echo once in SR as above.   Follow up in the AF clinic next week.    [facility] [address] [place], [place] [address] [phone] 11/03/2021 11:57 AM

## 2021-11-11 ENCOUNTER — Encounter (HOSPITAL_COMMUNITY): Payer: Self-pay | Admitting: Physician Assistant

## 2021-11-11 ENCOUNTER — Ambulatory Visit (HOSPITAL_COMMUNITY)
Admission: RE | Admit: 2021-11-11 | Discharge: 2021-11-11 | Disposition: A | Discharge status: Home / Self Care | Payer: BC Managed Care – PPO | Source: Ambulatory Visit | Attending: Physician Assistant | Admitting: Physician Assistant

## 2021-11-11 ENCOUNTER — Ambulatory Visit (HOSPITAL_COMMUNITY): Payer: BC Managed Care – PPO | Admitting: Physician Assistant

## 2021-11-11 ENCOUNTER — Other Ambulatory Visit: Payer: Self-pay

## 2021-11-11 VITALS — BP 124/86 | HR 63 | Ht 72.0 in | Wt 226.8 lb

## 2021-11-11 DIAGNOSIS — I4819 Other persistent atrial fibrillation: Secondary | ICD-10-CM | POA: Insufficient documentation

## 2021-11-11 DIAGNOSIS — J90 Pleural effusion, not elsewhere classified: Secondary | ICD-10-CM | POA: Insufficient documentation

## 2021-11-11 DIAGNOSIS — I38 Endocarditis, valve unspecified: Secondary | ICD-10-CM | POA: Insufficient documentation

## 2021-11-11 DIAGNOSIS — Z79899 Other long term (current) drug therapy: Secondary | ICD-10-CM | POA: Insufficient documentation

## 2021-11-11 DIAGNOSIS — I340 Nonrheumatic mitral (valve) insufficiency: Secondary | ICD-10-CM | POA: Diagnosis not present

## 2021-11-11 DIAGNOSIS — Z7901 Long term (current) use of anticoagulants: Secondary | ICD-10-CM | POA: Insufficient documentation

## 2021-11-11 DIAGNOSIS — I5020 Unspecified systolic (congestive) heart failure: Secondary | ICD-10-CM | POA: Diagnosis not present

## 2021-11-11 DIAGNOSIS — R0601 Orthopnea: Secondary | ICD-10-CM | POA: Insufficient documentation

## 2021-11-11 LAB — COMPREHENSIVE METABOLIC PANEL
ALT: 47 U/L — ABNORMAL HIGH (ref 0–44)
AST: 41 U/L (ref 15–41)
Albumin: 3.7 g/dL (ref 3.5–5.0)
Alkaline Phosphatase: 70 U/L (ref 38–126)
Anion gap: 7 (ref 5–15)
BUN: 15 mg/dL (ref 8–23)
CO2: 26 mmol/L (ref 22–32)
Calcium: 9.3 mg/dL (ref 8.9–10.3)
Chloride: 106 mmol/L (ref 98–111)
Creatinine, Ser: 0.71 mg/dL (ref 0.44–1.00)
GFR, Estimated: >60 mL/min (ref >60)
Glucose, Bld: 89 mg/dL (ref 70–99)
Potassium: 4 mmol/L (ref 3.5–5.1)
Sodium: 139 mmol/L (ref 135–145)
Total Bilirubin: 1.2 mg/dL (ref 0.3–1.2)
Total Protein: 6.8 g/dL (ref 6.5–8.1)

## 2021-11-11 LAB — TSH: TSH: 3.386 u[IU]/mL (ref 0.350–4.500)

## 2021-11-11 NOTE — Patient Instructions (Signed)
Continue Amiodarone 200mg  twice a day for 3 more weeks then reduce amiodarone to 200mg  once a day

## 2021-11-11 NOTE — Progress Notes (Signed)
Primary Care Physician: Michael Boston, MD Primary Cardiologist: none Primary Electrophysiologist: none Referring Physician: Dr Delma Post Teagyn Fishel is a 61 y.o. female with a history of new onset atrial fibrillation who presents for follow up in the [facility]. The patient was initially diagnosed with atrial fibrillation 10/05/21 after presenting to her PCP with symptoms of SOB. ECG showed afib with rapid rates. She was started on metoprolol for rate control. Patient has a CHADS2VASC score of 1. She was sent by her PCP for a CT to rule out PE which was negative. It did show small pleural effusions and peribrochovascular ground glass in lower lobes, consistent with CHF. She reports that she had been fatigued for two months leading up to this diagnosis. She also reported orthopnea.   Patient is s/p DCCV on 10/28/21. Unfortunately, she was back in afib 11/02/21 with heart racing and fatigue. Her BB was increased. Echo showed EF 30-35%, moderate-severe MR. While in SR she felt "great". She was started on amiodarone with plan to recheck echo once in SR.  On follow up today, patient reports that she converted to [place] 11/10/21, confirmed on her smart watch. ECG today shows SR. Her weight is up today and she does have some chest tightness. No lower extremity edema or orthopnea.   Today, she denies symptoms of palpitations, chest pain, PND, lower extremity edema, orthopnea, dizziness, presyncope, syncope, snoring, daytime somnolence, bleeding, or neurologic sequela. The patient is tolerating medications without difficulties and is otherwise without complaint today.    Atrial Fibrillation Risk Factors:  she does not have symptoms or diagnosis of sleep apnea. she does not have a history of rheumatic fever. she does not have a history of alcohol use. The patient does not have a history of early familial atrial fibrillation or other arrhythmias.  she has a BMI of Body  mass index is 30.76 kg/m.Marland Kitchen Filed Weights   11/11/21 1146  Weight: 102.9 kg       Family History  Problem Relation Age of Onset   Hypertension Mother    Diabetes Mother    Heart disease Mother    Lung cancer Father    Other Father        father was adopted - no paternal family history information   Breast cancer Sister    Cancer Sister 54       breast (half sister - same mother)   Diabetes Maternal Grandmother    Colon cancer Neg Hx    Esophageal cancer Neg Hx    Stomach cancer Neg Hx      Atrial Fibrillation Management history:  Previous antiarrhythmic drugs: amiodarone  Previous cardioversions: 10/28/21 Previous ablations: none CHADS2VASC score: 2 Anticoagulation history: Eliquis   Past Medical History:  Diagnosis Date   Allergy    Anxiety    Barrett esophagus 2008   Breast cancer ([place])    Breast cancer of lower-outer quadrant of left female breast ([place]) 03/15/2015   Cough 08/03/2012   nonproductive   Distal radius fracture, left 12/05/2012   Diverticulosis of colon (without mention of hemorrhage)    Esophageal reflux    Hiatal hernia 2013   large   Melanoma ([place])    removed from betwen shoulder blades   Personal history of radiation therapy 2016   Recurrent UTI    Stricture and stenosis of esophagus 2013   Past Surgical History:  Procedure Laterality Date   BREAST LUMPECTOMY Left 2016   BREAST  LUMPECTOMY WITH RADIOACTIVE SEED AND SENTINEL LYMPH NODE BIOPSY Left 03/25/2015   Procedure: LEFT BREAST LUMPECTOMY WITH RADIOACTIVE SEED AND LEFT AXILLARY SENTINEL LYMPH NODE BIOPSY;  Surgeon: Alphonsa Overall, MD;  Location: [facility];  Service: General;  Laterality: Left;   CARDIOVERSION N/A 10/28/2021   Procedure: CARDIOVERSION;  Surgeon: Werner Lean, MD;  Location: [facility];  Service: Cardiovascular;  Laterality: N/A;   [place]     COLONOSCOPY  03/25/2007   Diverticulosis     ESOPHAGOGASTRODUODENOSCOPY  04/10/2012   HH, and Stricture   LAPAROSCOPIC NISSEN FUNDOPLICATION  34/11/9377   Procedure: LAPAROSCOPIC NISSEN FUNDOPLICATION;  Surgeon: Pedro Earls, MD;  [facility];  Service: General;  Laterality: N/A;  HIATAL HERNIA REPAIR   MELANOMA EXCISION     back   OPEN REDUCTION INTERNAL FIXATION (ORIF) DISTAL RADIAL FRACTURE  12/05/2012   Procedure: OPEN REDUCTION INTERNAL FIXATION (ORIF) DISTAL RADIAL FRACTURE;  Surgeon: Johnny Bridge, MD;  Location: [facility];  Service: Orthopedics;  Laterality: Left;    Current Outpatient Medications  Medication Sig Dispense Refill   amiodarone (PACERONE) 200 MG tablet Take 1 tablet by mouth twice a day for 1 month then reduce to 1 tablet daily 60 tablet 0   anastrozole (ARIMIDEX) 1 MG tablet Take 1 tablet (1 mg total) by mouth daily. 90 tablet 3   apixaban (ELIQUIS) 5 MG TABS tablet Take 1 tablet (5 mg total) by mouth 2 (two) times daily. 60 tablet 3   Calcium Carbonate-Vitamin D (CALCIUM-VITAMIN D) 500-200 MG-UNIT per tablet Take 1 tablet by mouth 2 (two) times daily.     furosemide (LASIX) 20 MG tablet Take 1 tablet (20 mg total) by mouth daily as needed (swelling/wt gain). 10 tablet 1   metoprolol succinate (TOPROL-XL) 50 MG 24 hr tablet Take 1.5 tablets (75 mg total) by mouth 2 (two) times daily. 60 tablet 3   pantoprazole (PROTONIX) 40 MG tablet Take 1 tablet (40 mg total) by mouth 2 (two) times daily. 90 tablet 7   potassium chloride (KLOR-CON M) 10 MEQ tablet Take 1 tablet (10 mEq total) by mouth daily as needed (when lasix is taken). 10 tablet 1   venlafaxine XR (EFFEXOR-XR) 75 MG 24 hr capsule TAKE 1 CAPSULE BY MOUTH EVERY DAY WITH BREAKFAST 90 capsule 3   VITAMIN D PO Take 5,000 Units by mouth daily.     No current facility-administered medications for this encounter.    Allergies  Allergen Reactions   Buprenex [Buprenorphine] Itching   Pseudoephedrine Hcl Itching    Social History    Socioeconomic History   Marital status: Married    Spouse name: Not on file   Number of children: 2   Years of education: 14   Highest education level: Not on file  Occupational History   Occupation: adm assistance    Employer: [employer]  Tobacco Use   Smoking status: Never   Smokeless tobacco: Never  Vaping Use   Vaping Use: Never used  Substance and Sexual Activity   Alcohol use: Not Currently    Alcohol/week: 2.0 - 3.0 standard drinks    Types: 2 - 3 Standard drinks or equivalent per week    Comment: weekends 4-6 10/07/21   Drug use: No   Sexual activity: Yes  Other Topics Concern   Not on file  Social History Narrative   HSG, [employer] - 2 years. Married 1990. 2 dtrs - '92, '94. Marriage  in good health.   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   Feels safe at home and denies abuse            Social Determinants of Health   Financial Resource Strain: Not on file  Food Insecurity: Not on file  Transportation Needs: Not on file  Physical Activity: Not on file  Stress: Not on file  Social Connections: Not on file  Intimate Partner Violence: Not on file     ROS- All systems are reviewed and negative except as per the HPI above.  Physical Exam: Vitals:   11/11/21 1146  BP: 124/86  Pulse: 63  Weight: 102.9 kg  Height: 6' (1.829 m)    GEN- The patient is a well appearing obese female, alert and oriented x 3 today.   HEENT-head normocephalic, atraumatic, sclera clear, conjunctiva pink, hearing intact, trachea midline. Lungs- Clear to ausculation bilaterally, normal work of breathing Heart- Regular rate and rhythm, no murmurs, rubs or gallops  GI- soft, NT, ND, + BS Extremities- no clubbing, cyanosis, or edema MS- no significant deformity or atrophy Skin- no rash or lesion Psych- euthymic mood, full affect Neuro- strength and sensation are intact   Wt  Readings from Last 3 Encounters:  11/11/21 102.9 kg  11/03/21 99.8 kg  10/28/21 101.2 kg    EKG today demonstrates  SR Vent. rate 63 BPM PR interval 190 ms QRS duration 88 ms QT/QTcB 490/501 ms  Echo 10/17/21 demonstrated  1. Left ventricular ejection fraction, by estimation, is 30 to 35%. The  left ventricle has moderately decreased function. The left ventricle  demonstrates global hypokinesis. The left ventricular internal cavity size was moderately dilated. Left ventricular diastolic parameters are indeterminate.   2. Right ventricular systolic function is mildly reduced. The right  ventricular size is normal. There is normal pulmonary artery systolic  pressure.   3. Mitral regurgitation is significant with jets directed both posterior  and centrally Would recomm TEE to further define mechanism, severity. Moderate to severe mitral valve regurgitation.   4. The aortic valve is tricuspid. Aortic valve regurgitation is not  visualized. Aortic valve sclerosis is present, with no evidence of aortic  valve stenosis.   5. Aortic dilatation noted. There is mild dilatation of the ascending  aorta, measuring 39 mm.   6. The inferior vena cava is dilated in size with <50% respiratory  variability, suggesting right atrial pressure of 15 mmHg.    Epic records are reviewed at length today  CHA2DS2-VASc Score = 2  The patient's score is based upon: CHF History: 1 HTN History: 0 Diabetes History: 0 Stroke History: 0 Vascular Disease History: 0 Age Score: 0 Gender Score: 1       ASSESSMENT AND PLAN: 1. Persistent Atrial Fibrillation (ICD10:  I48.19) The patient's CHA2DS2-VASc score is 2, indicating a 2.2% annual risk of stroke.   S/p DCCV on 10/28/21 with early return of afib.  Patient chemically converted to SR. Continue amiodarone 200 mg BID for 3 more weeks then decrease to 200 mg daily. Will plan to recheck echo in 2-3 months. If her valve improves with SR, will refer to EP  to consider ablation for long-term rhythm management. If valve remains severe in SR, will refer to structural team. Continue Eliquis 5 mg BID Continue Toprol 75 mg BID Will check baseline Cmet/TSH  2. Valvular heart disease  Moderate-severe MR Echo as above.  If MR persists, will refer to structural heart team.   3. HFrEF EF 30-35% Her weight is up today. She will take Lasix daily through the weekend.  Repeat echo as above.    Follow up in the AF clinic in 3 months after her echo.    [facility] [address] [place], [place] [address] [phone] 11/11/2021 11:58 AM

## 2021-11-25 ENCOUNTER — Ambulatory Visit (HOSPITAL_COMMUNITY): Admit: 2021-11-25 | Payer: BC Managed Care – PPO | Admitting: Cardiology

## 2021-11-25 ENCOUNTER — Other Ambulatory Visit (HOSPITAL_COMMUNITY): Payer: Self-pay | Admitting: Physician Assistant

## 2021-11-25 ENCOUNTER — Encounter (HOSPITAL_COMMUNITY): Payer: Self-pay

## 2021-11-25 SURGERY — CARDIOVERSION
Anesthesia: Monitor Anesthesia Care

## 2021-12-05 ENCOUNTER — Ambulatory Visit (HOSPITAL_COMMUNITY): Payer: BC Managed Care – PPO | Admitting: Physician Assistant

## 2021-12-07 ENCOUNTER — Other Ambulatory Visit (HOSPITAL_COMMUNITY): Payer: Self-pay | Admitting: *Deleted

## 2021-12-07 MED ORDER — METOPROLOL SUCCINATE ER 50 MG PO TB24: 75.0000 mg | ORAL_TABLET | Freq: Two times a day (BID) | ORAL | 2 refills | Status: DC

## 2022-01-03 DIAGNOSIS — Z0000 Encounter for general adult medical examination without abnormal findings: Secondary | ICD-10-CM | POA: Diagnosis not present

## 2022-01-03 DIAGNOSIS — I4891 Unspecified atrial fibrillation: Secondary | ICD-10-CM | POA: Diagnosis not present

## 2022-01-03 DIAGNOSIS — E559 Vitamin D deficiency, unspecified: Secondary | ICD-10-CM | POA: Diagnosis not present

## 2022-01-10 DIAGNOSIS — Z1331 Encounter for screening for depression: Secondary | ICD-10-CM | POA: Diagnosis not present

## 2022-01-10 DIAGNOSIS — Z1339 Encounter for screening examination for other mental health and behavioral disorders: Secondary | ICD-10-CM | POA: Diagnosis not present

## 2022-01-10 DIAGNOSIS — Z0000 Encounter for general adult medical examination without abnormal findings: Secondary | ICD-10-CM | POA: Diagnosis not present

## 2022-01-16 ENCOUNTER — Other Ambulatory Visit (HOSPITAL_COMMUNITY): Payer: Self-pay | Admitting: *Deleted

## 2022-01-16 MED ORDER — METOPROLOL SUCCINATE ER 50 MG PO TB24
75.0000 mg | ORAL_TABLET | Freq: Two times a day (BID) | ORAL | 2 refills | Status: DC
Start: 2022-01-16 — End: 2022-05-22

## 2022-01-26 ENCOUNTER — Other Ambulatory Visit (HOSPITAL_COMMUNITY): Payer: Self-pay | Admitting: Physician Assistant

## 2022-02-09 ENCOUNTER — Ambulatory Visit (HOSPITAL_COMMUNITY)
Admission: RE | Admit: 2022-02-09 | Discharge: 2022-02-09 | Disposition: A | Discharge status: Home / Self Care | Payer: BC Managed Care – PPO | Source: Ambulatory Visit | Attending: Physician Assistant | Admitting: Physician Assistant

## 2022-02-09 DIAGNOSIS — Z853 Personal history of malignant neoplasm of breast: Secondary | ICD-10-CM | POA: Insufficient documentation

## 2022-02-09 DIAGNOSIS — I4819 Other persistent atrial fibrillation: Secondary | ICD-10-CM | POA: Diagnosis not present

## 2022-02-09 LAB — ECHOCARDIOGRAM COMPLETE
Area-P 1/2: 2.46 cm2
Calc EF: 51.1 %
MV M vel: 4.17 m/s
MV Peak grad: 69.6 mmHg
P 1/2 time: 1925 msec
Radius: 0.6 cm
S' Lateral: 4.9 cm
Single Plane A2C EF: 61.4 %
Single Plane A4C EF: 37.5 %

## 2022-02-14 DIAGNOSIS — Z01419 Encounter for gynecological examination (general) (routine) without abnormal findings: Secondary | ICD-10-CM | POA: Diagnosis not present

## 2022-02-14 DIAGNOSIS — Z6831 Body mass index (BMI) 31.0-31.9, adult: Secondary | ICD-10-CM | POA: Diagnosis not present

## 2022-02-15 ENCOUNTER — Encounter (HOSPITAL_COMMUNITY): Payer: Self-pay | Admitting: Physician Assistant

## 2022-02-15 ENCOUNTER — Ambulatory Visit (HOSPITAL_COMMUNITY)
Admission: RE | Admit: 2022-02-15 | Discharge: 2022-02-15 | Disposition: A | Discharge status: Home / Self Care | Payer: BC Managed Care – PPO | Source: Ambulatory Visit | Attending: Physician Assistant | Admitting: Physician Assistant

## 2022-02-15 VITALS — BP 110/82 | HR 54 | Ht 72.0 in | Wt 227.0 lb

## 2022-02-15 DIAGNOSIS — I4819 Other persistent atrial fibrillation: Secondary | ICD-10-CM | POA: Insufficient documentation

## 2022-02-15 DIAGNOSIS — Z79899 Other long term (current) drug therapy: Secondary | ICD-10-CM | POA: Insufficient documentation

## 2022-02-15 DIAGNOSIS — Z7901 Long term (current) use of anticoagulants: Secondary | ICD-10-CM | POA: Diagnosis not present

## 2022-02-15 DIAGNOSIS — I38 Endocarditis, valve unspecified: Secondary | ICD-10-CM | POA: Insufficient documentation

## 2022-02-15 DIAGNOSIS — R0600 Dyspnea, unspecified: Secondary | ICD-10-CM | POA: Insufficient documentation

## 2022-02-15 DIAGNOSIS — I5022 Chronic systolic (congestive) heart failure: Secondary | ICD-10-CM | POA: Diagnosis not present

## 2022-02-15 NOTE — Progress Notes (Signed)
? ? ?Primary Care Physician: Michael Boston, MD ?Primary Cardiologist: none ?Primary Electrophysiologist: none ?Referring Physician: Dr Jacalyn Lefevre ? ? ?Wanda Moore is a 61 y.o. female with a history of new onset atrial fibrillation who presents for follow up in the [facility]. The patient was initially diagnosed with atrial fibrillation 10/05/21 after presenting to her PCP with symptoms of SOB. ECG showed afib with rapid rates. She was started on metoprolol for rate control. Patient has a CHADS2VASC score of 1. She was sent by her PCP for a CT to rule out PE which was negative. It did show small pleural effusions and peribrochovascular ground glass in lower lobes, consistent with CHF. She reports that she had been fatigued for two months leading up to this diagnosis. She also reported orthopnea.  ? ?Patient is s/p DCCV on 10/28/21. Unfortunately, she was back in afib 11/02/21 with heart racing and fatigue. Her BB was increased. Echo showed EF 30-35%, moderate-severe MR. While in SR she felt "great". She was started on amiodarone with plan to recheck echo once in SR. Patient reports that she converted to [place] 11/10/21, confirmed on her smart watch.  ? ?On follow up today, patient reports that she has done well since her last visit. Patient had an echo which showed improvement in her EF, up to 45-50%, MR improved in SR. She does still have some dyspnea with exertion. No symptoms of fluid overload.  ? ?Today, she denies symptoms of palpitations, chest pain, PND, lower extremity edema, orthopnea, dizziness, presyncope, syncope, snoring, daytime somnolence, bleeding, or neurologic sequela. The patient is tolerating medications without difficulties and is otherwise without complaint today.  ? ? ?Atrial Fibrillation Risk Factors: ? ?she does not have symptoms or diagnosis of sleep apnea. ?she does not have a history of rheumatic fever. ?she does not have a history of alcohol use. ?The patient  does not have a history of early familial atrial fibrillation or other arrhythmias. ? ?she has a BMI of Body mass index is 30.79 kg/m?Marland KitchenMarland Kitchen ?Filed Weights  ? 02/15/22 1111  ?Weight: 103 kg  ? ? ? ? ?Family History  ?Problem Relation Age of Onset  ? Hypertension Mother   ? Diabetes Mother   ? Heart disease Mother   ? Lung cancer Father   ? Other Father   ?     father was adopted - no paternal family history information  ? Breast cancer Sister   ? Cancer Sister 6  ?     breast (half sister - same mother)  ? Diabetes Maternal Grandmother   ? Colon cancer Neg Hx   ? Esophageal cancer Neg Hx   ? Stomach cancer Neg Hx   ? ? ? ?Atrial Fibrillation Management history: ? ?Previous antiarrhythmic drugs: amiodarone  ?Previous cardioversions: 10/28/21 ?Previous ablations: none ?CHADS2VASC score: 2 ?Anticoagulation history: Eliquis ? ? ?Past Medical History:  ?Diagnosis Date  ? Allergy   ? Anxiety   ? Barrett esophagus 2008  ? Breast cancer ([place])   ? Breast cancer of lower-outer quadrant of left female breast ([place]) 03/15/2015  ? Cough 08/03/2012  ? nonproductive  ? Distal radius fracture, left 12/05/2012  ? Diverticulosis of colon (without mention of hemorrhage)   ? Esophageal reflux   ? Hiatal hernia 2013  ? large  ? Melanoma ([place])   ? removed from betwen shoulder blades  ? Personal history of radiation therapy 2016  ? Recurrent UTI   ? Stricture and stenosis of esophagus  2013  ? ?Past Surgical History:  ?Procedure Laterality Date  ? BREAST LUMPECTOMY Left 2016  ? BREAST LUMPECTOMY WITH RADIOACTIVE SEED AND SENTINEL LYMPH NODE BIOPSY Left 03/25/2015  ? Procedure: LEFT BREAST LUMPECTOMY WITH RADIOACTIVE SEED AND LEFT AXILLARY SENTINEL LYMPH NODE BIOPSY;  Surgeon: Alphonsa Overall, MD;  Location: [place];  Service: General;  Laterality: Left;  ? CARDIOVERSION N/A 10/28/2021  ? Procedure: CARDIOVERSION;  Surgeon: Werner Lean, MD;  Location: [place];  Service: Cardiovascular;  Laterality: N/A;  ?  [place]  ? CHOLECYSTECTOMY    ? COLONOSCOPY  03/25/2007  ? Diverticulosis   ? ESOPHAGOGASTRODUODENOSCOPY  04/10/2012  ? HH, and Stricture  ? LAPAROSCOPIC NISSEN FUNDOPLICATION  29/07/2425  ? Procedure: LAPAROSCOPIC NISSEN FUNDOPLICATION;  Surgeon: Pedro Earls, MD;  Location: [facility];  Service: General;  Laterality: N/A;  HIATAL HERNIA REPAIR  ? MELANOMA EXCISION    ? back  ? OPEN REDUCTION INTERNAL FIXATION (ORIF) DISTAL RADIAL FRACTURE  12/05/2012  ? Procedure: OPEN REDUCTION INTERNAL FIXATION (ORIF) DISTAL RADIAL FRACTURE;  Surgeon: Johnny Bridge, MD;  Location: [place];  Service: Orthopedics;  Laterality: Left;  ? ? ?Current Outpatient Medications  ?Medication Sig Dispense Refill  ? amiodarone (PACERONE) 200 MG tablet Take 1 tablet (200 mg total) by mouth daily. Take 1 tablet by mouth twice a day for 1 month then reduce to 1 tablet daily 90 tablet 1  ? anastrozole (ARIMIDEX) 1 MG tablet Take 1 tablet (1 mg total) by mouth daily. 90 tablet 3  ? Calcium Carbonate-Vitamin D (CALCIUM-VITAMIN D) 500-200 MG-UNIT per tablet Take 1 tablet by mouth 2 (two) times daily.    ? ELIQUIS 5 MG TABS tablet TAKE 1 TABLET BY MOUTH TWICE A DAY 60 tablet 11  ? furosemide (LASIX) 20 MG tablet Take 1 tablet (20 mg total) by mouth daily as needed (swelling/wt gain). 10 tablet 1  ? metoprolol succinate (TOPROL-XL) 50 MG 24 hr tablet Take 1.5 tablets (75 mg total) by mouth 2 (two) times daily. 270 tablet 2  ? pantoprazole (PROTONIX) 40 MG tablet Take 1 tablet (40 mg total) by mouth 2 (two) times daily. 90 tablet 7  ? potassium chloride (KLOR-CON M) 10 MEQ tablet Take 1 tablet (10 mEq total) by mouth daily as needed (when lasix is taken). 10 tablet 1  ? venlafaxine XR (EFFEXOR-XR) 75 MG 24 hr capsule TAKE 1 CAPSULE BY MOUTH EVERY DAY WITH BREAKFAST 90 capsule 3  ? VITAMIN D PO Take 5,000 Units by mouth daily.    ? ?No current facility-administered medications for this encounter.  ? ? ?Allergies   ?Allergen Reactions  ? Buprenex [Buprenorphine] Itching  ? Pseudoephedrine Hcl Itching  ? ? ?Social History  ? ?Socioeconomic History  ? Marital status: Married  ?  Spouse name: Not on file  ? Number of children: 2  ? Years of education: 88  ? Highest education level: Not on file  ?Occupational History  ? Occupation: adm assistance  ?  Employer: [employer]  ?Tobacco Use  ? Smoking status: Never  ? Smokeless tobacco: Never  ? Tobacco comments:  ?  Never smoke 02/15/22  ?Vaping Use  ? Vaping Use: Never used  ?Substance and Sexual Activity  ? Alcohol use: Yes  ?  Alcohol/week: 2.0 - 3.0 standard drinks  ?  Types: 2 - 3 Standard drinks or equivalent per week  ?  Comment: weekends 4-6 10/07/21  ? Drug use: No  ?  Sexual activity: Yes  ?Other Topics Concern  ? Not on file  ?Social History Narrative  ? HSG, [employer] - 2 years. Married 1990. 2 dtrs - '92, '94. Marriage in good health.  ? 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 Fun: Garden and flowers, read, hiking, kayaking  ? Denies religious beliefs effecting health care.  ? Feels safe at home and denies abuse  ?   ?   ?   ? ?Social Determinants of Health  ? ?Financial Resource Strain: Not on file  ?Food Insecurity: Not on file  ?Transportation Needs: Not on file  ?Physical Activity: Not on file  ?Stress: Not on file  ?Social Connections: Not on file  ?Intimate Partner Violence: Not on file  ? ? ? ?ROS- All systems are reviewed and negative except as per the HPI above. ? ?Physical Exam: ?Vitals:  ? 02/15/22 1111  ?BP: 110/82  ?Pulse: (!) 54  ?Weight: 103 kg  ?Height: 6' (1.829 m)  ? ? ?GEN- The patient is a well appearing female, alert and oriented x 3 today.   ?HEENT-head normocephalic, atraumatic, sclera clear, conjunctiva pink, hearing intact, trachea midline. ?Lungs- Clear to ausculation bilaterally, normal work of breathing ?Heart- Regular rate and rhythm, bradycardia, no murmurs, rubs or gallops  ?GI- soft, NT, ND, + BS ?Extremities- no clubbing, cyanosis, or  edema ?MS- no significant deformity or atrophy ?Skin- no rash or lesion ?Psych- euthymic mood, full affect ?Neuro- strength and sensation are intact ? ? ?Wt Readings from Last 3 Encounters:  ?02/15/22 103 kg  ?11/11/21 10

## 2022-02-23 ENCOUNTER — Other Ambulatory Visit (HOSPITAL_COMMUNITY): Payer: Self-pay | Admitting: *Deleted

## 2022-02-23 MED ORDER — AMIODARONE HCL 200 MG PO TABS: 200.0000 mg | ORAL_TABLET | Freq: Every day | ORAL | 1 refills | Status: DC

## 2022-03-08 DIAGNOSIS — L578 Other skin changes due to chronic exposure to nonionizing radiation: Secondary | ICD-10-CM | POA: Diagnosis not present

## 2022-03-08 DIAGNOSIS — L814 Other melanin hyperpigmentation: Secondary | ICD-10-CM | POA: Diagnosis not present

## 2022-03-08 DIAGNOSIS — D225 Melanocytic nevi of trunk: Secondary | ICD-10-CM | POA: Diagnosis not present

## 2022-03-08 DIAGNOSIS — L570 Actinic keratosis: Secondary | ICD-10-CM | POA: Diagnosis not present

## 2022-03-08 DIAGNOSIS — L821 Other seborrheic keratosis: Secondary | ICD-10-CM | POA: Diagnosis not present

## 2022-03-09 ENCOUNTER — Encounter: Payer: Self-pay | Admitting: Cardiology

## 2022-03-09 ENCOUNTER — Ambulatory Visit (INDEPENDENT_AMBULATORY_CARE_PROVIDER_SITE_OTHER): Payer: BC Managed Care – PPO | Admitting: Cardiology

## 2022-03-09 DIAGNOSIS — R000 Tachycardia, unspecified: Secondary | ICD-10-CM

## 2022-03-09 DIAGNOSIS — I340 Nonrheumatic mitral (valve) insufficiency: Secondary | ICD-10-CM

## 2022-03-09 DIAGNOSIS — I4819 Other persistent atrial fibrillation: Secondary | ICD-10-CM

## 2022-03-09 DIAGNOSIS — I43 Cardiomyopathy in diseases classified elsewhere: Secondary | ICD-10-CM | POA: Diagnosis not present

## 2022-03-09 NOTE — Assessment & Plan Note (Signed)
Has an appointment coming up with Dr. Quentin Ore to discuss ablative therapies.  I think this would be excellent for her.  We also did mention Watchman device if ever needed in the future.  Continue with amiodarone for now.  Toprol. ?

## 2022-03-09 NOTE — Assessment & Plan Note (Signed)
EF improved with normal rhythm.  Awaiting ablative procedure.  Continue with Toprol for now.  Blood pressure 114/70.  I will not add Entresto. ?

## 2022-03-09 NOTE — Progress Notes (Signed)
?Cardiology Office Note:   ? ?Date:  03/09/2022  ? ?ID:  Wanda Moore, DOB [birth year], MRN [id number] ? ?PCP:  Michael Boston, MD ?  ?[facility] Providers ?Cardiologist:  Candee Furbish, MD    ? ?Referring MD: Oliver Barre, PA  ? ? ?History of Present Illness:   ? ?Wanda Moore is a 61 y.o. female here for the evaluation of atrial fibrillation at the request Adline Peals. ? ?Has been seen in the atrial fibrillation clinic originally diagnosed on 10/05/2021 after presenting with shortness of breath to her PCP.  EKG showed atrial fibrillation with rapid rates. ? ?She was placed on metoprolol, CHADSVASc was 2 (heart failure and female).  CT scan was performed that was negative for PE. ? ?She had cardioversion on 10/28/2021 and went back into atrial fibrillation a week later.  Beta-blocker was increased but her echo showed EF of 30% with moderate to severe MR.  While in sinus rhythm she felt great.  She was started on amiodarone with plan to recheck echo once in sinus.  She converted to sinus rhythm on 11/10/2021 according to her smart watch. ? ?She did have a repeat echo that showed EF of 45 to 50% mitral regurgitation also improved. ? ?She has appropriately been referred to EP for ablative therapy has upcoming appointment. ? Continuing with amiodarone 200 mg a day right now.  Would like to get off this medication ? ?On Eliquis and Toprol 75 twice daily ? ?Mother had CHF. MI later age.  ? ?Non smoker ? ? ? ?Past Medical History:  ?Diagnosis Date  ? Allergy   ? Anxiety   ? Barrett esophagus 2008  ? Breast cancer ([place])   ? Breast cancer of lower-outer quadrant of left female breast ([place]) 03/15/2015  ? Cough 08/03/2012  ? nonproductive  ? Distal radius fracture, left 12/05/2012  ? Diverticulosis of colon (without mention of hemorrhage)   ? Esophageal reflux   ? Hiatal hernia 2013  ? large  ? Melanoma ([place])   ? removed from betwen shoulder blades  ? Personal history of radiation therapy 2016  ? Recurrent  UTI   ? Stricture and stenosis of esophagus 2013  ? ? ?Past Surgical History:  ?Procedure Laterality Date  ? BREAST LUMPECTOMY Left 2016  ? BREAST LUMPECTOMY WITH RADIOACTIVE SEED AND SENTINEL LYMPH NODE BIOPSY Left 03/25/2015  ? Procedure: LEFT BREAST LUMPECTOMY WITH RADIOACTIVE SEED AND LEFT AXILLARY SENTINEL LYMPH NODE BIOPSY;  Surgeon: Alphonsa Overall, MD;  Location: [place];  Service: General;  Laterality: Left;  ? CARDIOVERSION N/A 10/28/2021  ? Procedure: CARDIOVERSION;  Surgeon: Werner Lean, MD;  Location: [place];  Service: Cardiovascular;  Laterality: N/A;  ? [place]  ? CHOLECYSTECTOMY    ? COLONOSCOPY  03/25/2007  ? Diverticulosis   ? ESOPHAGOGASTRODUODENOSCOPY  04/10/2012  ? HH, and Stricture  ? LAPAROSCOPIC NISSEN FUNDOPLICATION  04/07/3761  ? Procedure: LAPAROSCOPIC NISSEN FUNDOPLICATION;  Surgeon: Pedro Earls, MD;  [facility];  Service: General;  Laterality: N/A;  HIATAL HERNIA REPAIR  ? MELANOMA EXCISION    ? back  ? OPEN REDUCTION INTERNAL FIXATION (ORIF) DISTAL RADIAL FRACTURE  12/05/2012  ? Procedure: OPEN REDUCTION INTERNAL FIXATION (ORIF) DISTAL RADIAL FRACTURE;  Surgeon: Johnny Bridge, MD;  Location: [place];  Service: Orthopedics;  Laterality: Left;  ? ? ?Current Medications: ?Current Meds  ?Medication Sig  ? amiodarone (PACERONE) 200 MG tablet Take 1 tablet (200 mg  total) by mouth daily.  ? anastrozole (ARIMIDEX) 1 MG tablet Take 1 tablet (1 mg total) by mouth daily.  ? Calcium Carbonate-Vitamin D (CALCIUM-VITAMIN D) 500-200 MG-UNIT per tablet Take 1 tablet by mouth 2 (two) times daily.  ? ELIQUIS 5 MG TABS tablet TAKE 1 TABLET BY MOUTH TWICE A DAY  ? furosemide (LASIX) 20 MG tablet Take 1 tablet (20 mg total) by mouth daily as needed (swelling/wt gain).  ? metoprolol succinate (TOPROL-XL) 50 MG 24 hr tablet Take 1.5 tablets (75 mg total) by mouth 2 (two) times daily.  ? pantoprazole (PROTONIX) 40 MG tablet Take 1  tablet (40 mg total) by mouth 2 (two) times daily.  ? potassium chloride (KLOR-CON M) 10 MEQ tablet Take 1 tablet (10 mEq total) by mouth daily as needed (when lasix is taken).  ? venlafaxine XR (EFFEXOR-XR) 75 MG 24 hr capsule TAKE 1 CAPSULE BY MOUTH EVERY DAY WITH BREAKFAST  ? VITAMIN D PO Take 5,000 Units by mouth daily.  ?  ? ?Allergies:   Buprenex [buprenorphine] and Pseudoephedrine hcl  ? ?Social History  ? ?Socioeconomic History  ? Marital status: Married  ?  Spouse name: Not on file  ? Number of children: 2  ? Years of education: 22  ? Highest education level: Not on file  ?Occupational History  ? Occupation: adm assistance  ?  Employer: [employer]  ?Tobacco Use  ? Smoking status: Never  ? Smokeless tobacco: Never  ? Tobacco comments:  ?  Never smoke 02/15/22  ?Vaping Use  ? Vaping Use: Never used  ?Substance and Sexual Activity  ? Alcohol use: Yes  ?  Alcohol/week: 2.0 - 3.0 standard drinks  ?  Types: 2 - 3 Standard drinks or equivalent per week  ?  Comment: weekends 4-6 10/07/21  ? Drug use: No  ? Sexual activity: Yes  ?Other Topics Concern  ? Not on file  ?Social History Narrative  ? HSG, [employer] - 2 years. Married 1990. 2 dtrs - '92, '94. Marriage in good health.  ? 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 Fun: Garden and flowers, read, hiking, kayaking  ? Denies religious beliefs effecting health care.  ? Feels safe at home and denies abuse  ?   ?   ?   ? ?Social Determinants of Health  ? ?Financial Resource Strain: Not on file  ?Food Insecurity: Not on file  ?Transportation Needs: Not on file  ?Physical Activity: Not on file  ?Stress: Not on file  ?Social Connections: Not on file  ?  ? ?Family History: ?The patient's family history includes Breast cancer in her sister; Cancer (age of onset: 78) in her sister; Diabetes in her maternal grandmother and mother; Heart disease in her mother; Hypertension in her mother; Lung cancer in her father; Other in her father. There is no history of Colon cancer,  Esophageal cancer, or Stomach cancer. ? ?ROS:   ?Please see the history of present illness.    ? All other systems reviewed and are negative. ? ?EKGs/Labs/Other Studies Reviewed:   ? ?The following studies were reviewed today: ? ?ECHO 02/09/22: ? 1. Left ventricular ejection fraction, by estimation, is 45 to 50%. The  ?left ventricle has mildly decreased function. The left ventricle  ?demonstrates global hypokinesis. The left ventricular internal cavity size  ?was mildly dilated. There is mild  ?asymmetric left ventricular hypertrophy of the basal-septal segment. Left  ?ventricular diastolic parameters are indeterminate.  ? 2. Right ventricular systolic function is normal.  The right ventricular  ?size is mildly enlarged. Tricuspid regurgitation signal is inadequate for  ?assessing PA pressure.  ? 3. The mitral valve is grossly normal. Mild to moderate mitral valve  ?regurgitation. No evidence of mitral stenosis.  ? 4. The aortic valve is tricuspid. Aortic valve regurgitation is mild. No  ?aortic stenosis is present.  ? 5. The inferior vena cava is normal in size with greater than 50%  ?respiratory variability, suggesting right atrial pressure of 3 mmHg.  ? ?Comparison(s): Changes from prior study are noted. EF has improved to  ?45-50%. Mild to moderate MR.  ? ?EKG: 02/15/2022-sinus rhythm 54 bpm ? ?Recent Labs: ?10/07/2021: B Natriuretic Peptide 272.4 ?10/14/2021: Hemoglobin 12.5; Platelets 270 ?11/11/2021: ALT 47; BUN 15; Creatinine, Ser 0.71; Potassium 4.0; Sodium 139; TSH 3.386  ?Recent Lipid Panel ?   ?Component Value Date/Time  ? CHOL 168 12/19/2016 0000  ? TRIG 74 12/19/2016 0000  ? HDL 58 12/19/2016 0000  ? CHOLHDL 5 07/30/2015 0732  ? VLDL 27.0 07/30/2015 0732  ? [place] 95 12/19/2016 0000  ? LDLDIRECT 131.0 05/01/2013 1558  ? ? ? ?Risk Assessment/Calculations:   ? ? ?    ? ?   ? ?Physical Exam:   ? ?VS:  BP 114/70   Pulse (!) 59   Ht '6\' 1"'$  (1.854 m)   Wt 234 lb 6.4 oz (106.3 kg)   SpO2 95%   BMI 30.93 kg/m?     ? ?Wt Readings from Last 3 Encounters:  ?03/09/22 234 lb 6.4 oz (106.3 kg)  ?02/15/22 227 lb (103 kg)  ?11/11/21 226 lb 12.8 oz (102.9 kg)  ?  ? ?GEN:  Well nourished, well developed in no acute distress ?HEENT:

## 2022-03-09 NOTE — Assessment & Plan Note (Signed)
Mitral regurgitation much improved after restoration of normal rhythm. ?

## 2022-03-09 NOTE — Patient Instructions (Signed)
Medication Instructions:  The current medical regimen is effective;  continue present plan and medications.  *If you need a refill on your cardiac medications before your next appointment, please call your pharmacy*  Follow-Up: At [facility], you and your health needs are our priority.  As part of our continuing mission to provide you with exceptional heart care, we have created designated Provider Care Teams.  These Care Teams include your primary Cardiologist (physician) and Advanced Practice Providers (APPs -  Physician Assistants and Nurse Practitioners) who all work together to provide you with the care you need, when you need it.  We recommend signing up for the patient portal called "MyChart".  Sign up information is provided on this After Visit Summary.  MyChart is used to connect with patients for Virtual Visits (Telemedicine).  Patients are able to view lab/test results, encounter notes, upcoming appointments, etc.  Non-urgent messages can be sent to your provider as well.   To learn more about what you can do with MyChart, go to [email or web address].    Your next appointment:   6 month(s)  The format for your next appointment:   In Person  Provider:   Dr Mark Skains   Important Information About Sugar       

## 2022-03-31 DIAGNOSIS — M7661 Achilles tendinitis, right leg: Secondary | ICD-10-CM | POA: Diagnosis not present

## 2022-04-12 ENCOUNTER — Ambulatory Visit (INDEPENDENT_AMBULATORY_CARE_PROVIDER_SITE_OTHER): Payer: BC Managed Care – PPO | Admitting: Cardiology

## 2022-04-12 ENCOUNTER — Encounter: Payer: Self-pay | Admitting: Cardiology

## 2022-04-12 ENCOUNTER — Other Ambulatory Visit (HOSPITAL_COMMUNITY): Payer: Self-pay | Admitting: Physician Assistant

## 2022-04-12 VITALS — BP 110/74 | HR 52 | Ht 72.0 in | Wt 239.8 lb

## 2022-04-12 DIAGNOSIS — R000 Tachycardia, unspecified: Secondary | ICD-10-CM

## 2022-04-12 DIAGNOSIS — I4819 Other persistent atrial fibrillation: Secondary | ICD-10-CM | POA: Diagnosis not present

## 2022-04-12 DIAGNOSIS — I340 Nonrheumatic mitral (valve) insufficiency: Secondary | ICD-10-CM

## 2022-04-12 DIAGNOSIS — I43 Cardiomyopathy in diseases classified elsewhere: Secondary | ICD-10-CM

## 2022-04-12 NOTE — Patient Instructions (Addendum)
Medication Instructions:  Take you as needed Lasix tomorrow (6/8) If you have a weight gain of 3 pounds over night take you lasix. If the occurs two days in a row call the office.  Your physician recommends that you continue on your current medications as directed. Please refer to the Current Medication list given to you today. *If you need a refill on your cardiac medications before your next appointment, please call your pharmacy*  Lab Work: Aug 29 If you have labs (blood work) drawn today and your tests are completely normal, you will receive your results only by: [place] (if you have MyChart) OR A paper copy in the mail If you have any lab test that is abnormal or we need to change your treatment, we will call you to review the results.  Testing/Procedures: Your physician has requested that you have cardiac CT. Cardiac computed tomography (CT) is a painless test that uses an x-ray machine to take clear, detailed pictures of your heart. For further information please visit [email or web address].tn. Please follow instruction sheet as given.   Your physician has recommended that you have an ablation. Catheter ablation is a medical procedure used to treat some cardiac arrhythmias (irregular heartbeats). During catheter ablation, a long, thin, flexible tube is put into a blood vessel in your groin (upper thigh), or neck. This tube is called an ablation catheter. It is then guided to your heart through the blood vessel. Radio frequency waves destroy small areas of heart tissue where abnormal heartbeats may cause an arrhythmia to start. Please see the instruction sheet given to you today.   Follow-Up: At [facility], you and your health needs are our priority.  As part of our continuing mission to provide you with exceptional heart care, we have created designated Provider Care Teams.  These Care Teams include your primary Cardiologist (physician) and Advanced Practice Providers (APPs -   Physician Assistants and Nurse Practitioners) who all work together to provide you with the care you need, when you need it.  Your physician wants you to follow-up in: Ablation date picked Sept 19. Otila Kluver RN will call you will Cardiac CT and Ablation instruction .   We recommend signing up for the patient portal called "MyChart".  Sign up information is provided on this After Visit Summary.  MyChart is used to connect with patients for Virtual Visits (Telemedicine).  Patients are able to view lab/test results, encounter notes, upcoming appointments, etc.  Non-urgent messages can be sent to your provider as well.   To learn more about what you can do with MyChart, go to [email or web address].    Any Other Special Instructions Will Be Listed Below (If Applicable).  Cardiac Ablation Cardiac ablation is a procedure to destroy (ablate) some heart tissue that is sending bad signals. These bad signals cause problems in heart rhythm. The heart has many areas that make these signals. If there are problems in these areas, they can make the heart beat in a way that is not normal. Destroying some tissues can help make the heart rhythm normal. Tell your doctor about: Any allergies you have. All medicines you are taking. These include vitamins, herbs, eye drops, creams, and over-the-counter medicines. Any problems you or family members have had with medicines that make you fall asleep (anesthetics). Any blood disorders you have. Any surgeries you have had. Any medical conditions you have, such as kidney failure. Whether you are pregnant or may be pregnant. What are the risks? This is a safe procedure.  But problems may occur, including: Infection. Bruising and bleeding. Bleeding into the chest. Stroke or blood clots. Damage to nearby areas of your body. Allergies to medicines or dyes. The need for a pacemaker if the normal system is damaged. Failure of the procedure to treat the problem. What happens  before the procedure? Medicines Ask your doctor about: Changing or stopping your normal medicines. This is important. Taking aspirin and ibuprofen. Do not take these medicines unless your doctor tells you to take them. Taking other medicines, vitamins, herbs, and supplements. General instructions Follow instructions from your doctor about what you cannot eat or drink. Plan to have someone take you home from the hospital or clinic. If you will be going home right after the procedure, plan to have someone with you for 24 hours. Ask your doctor what steps will be taken to prevent infection. What happens during the procedure?  An IV tube will be put into one of your veins. You will be given a medicine to help you relax. The skin on your neck or groin will be numbed. A cut (incision) will be made in your neck or groin. A needle will be put through your cut and into a large vein. A tube (catheter) will be put into the needle. The tube will be moved to your heart. Dye may be put through the tube. This helps your doctor see your heart. Small devices (electrodes) on the tube will send out signals. A type of energy will be used to destroy some heart tissue. The tube will be taken out. Pressure will be held on your cut. This helps stop bleeding. A bandage will be put over your cut. The exact procedure may vary among doctors and hospitals. What happens after the procedure? You will be watched until you leave the hospital or clinic. This includes checking your heart rate, breathing rate, oxygen, and blood pressure. Your cut will be watched for bleeding. You will need to lie still for a few hours. Do not drive for 24 hours or as long as your doctor tells you. Summary Cardiac ablation is a procedure to destroy some heart tissue. This is done to treat heart rhythm problems. Tell your doctor about any medical conditions you may have. Tell him or her about all medicines you are taking to treat  them. This is a safe procedure. But problems may occur. These include infection, bruising, bleeding, and damage to nearby areas of your body. Follow what your doctor tells you about food and drink. You may also be told to change or stop some of your medicines. After the procedure, do not drive for 24 hours or as long as your doctor tells you. This information is not intended to replace advice given to you by your health care provider. Make sure you discuss any questions you have with your health care provider. Document Revised: 09/25/2019 Document Reviewed: 09/25/2019 Elsevier Patient Education  [place].

## 2022-04-12 NOTE — Progress Notes (Signed)
Electrophysiology Office Note:    Date:  04/12/2022   ID:  Wanda Moore, DOB October 07, 1961, MRN [id number]  PCP:  Michael Boston, MD  [facility] Cardiologist:  Candee Furbish, MD  [facility] Electrophysiologist:  None   Referring MD: Oliver Barre, PA   Chief Complaint: Atrial fibrillation  History of Present Illness:    Wanda Moore is a 61 y.o. female who presents for an evaluation of atrial fibrillation at the request of Wanda Moore. Their medical history includes breast cancer, melanoma, esophageal stricture and large hiatal hernia.  The patient last saw Candee Furbish on Mar 09, 2022.  She has previously had a cardioversion for her atrial fibrillation in December 2022.  She went back in A-fib a week later.  Her ejection fraction around that time was abnormal with an EF of 30% and moderate to severe MR.  She felt great while in normal rhythm.  Amiodarone was started which successfully converted her back to sinus rhythm.  Repeat echo after being in sinus rhythm showed a much improved EF of 45 to 50% with less mitral regurgitation.  She continues to be on amiodarone 200 mg by mouth once daily.     Past Medical History:  Diagnosis Date   Allergy    Anxiety    Barrett esophagus 2008   Breast cancer [facility])    Breast cancer of lower-outer quadrant of left female breast ([place]) 03/15/2015   Cough 08/03/2012   nonproductive   Distal radius fracture, left 12/05/2012   Diverticulosis of colon (without mention of hemorrhage)    Esophageal reflux    Hiatal hernia 2013   large   Melanoma ([place])    removed from betwen shoulder blades   Personal history of radiation therapy 2016   Recurrent UTI    Stricture and stenosis of esophagus 2013    Past Surgical History:  Procedure Laterality Date   BREAST LUMPECTOMY Left 2016   BREAST LUMPECTOMY WITH RADIOACTIVE SEED AND SENTINEL LYMPH NODE BIOPSY Left 03/25/2015   Procedure: LEFT BREAST LUMPECTOMY WITH RADIOACTIVE SEED AND  LEFT AXILLARY SENTINEL LYMPH NODE BIOPSY;  Surgeon: Alphonsa Overall, MD;  Location: [place];  Service: General;  Laterality: Left;   CARDIOVERSION N/A 10/28/2021   Procedure: CARDIOVERSION;  Surgeon: Werner Lean, MD;  Location: [place];  Service: Cardiovascular;  Laterality: N/A;   [place]     COLONOSCOPY  03/25/2007   Diverticulosis    ESOPHAGOGASTRODUODENOSCOPY  04/10/2012   HH, and Stricture   LAPAROSCOPIC NISSEN FUNDOPLICATION  92/11/1939   Procedure: LAPAROSCOPIC NISSEN FUNDOPLICATION;  Surgeon: Pedro Earls, MD;  Location: [facility];  Service: General;  Laterality: N/A;  HIATAL HERNIA REPAIR   MELANOMA EXCISION     back   OPEN REDUCTION INTERNAL FIXATION (ORIF) DISTAL RADIAL FRACTURE  12/05/2012   Procedure: OPEN REDUCTION INTERNAL FIXATION (ORIF) DISTAL RADIAL FRACTURE;  Surgeon: Johnny Bridge, MD;  Location: [place];  Service: Orthopedics;  Laterality: Left;    Current Medications: Current Meds  Medication Sig   amiodarone (PACERONE) 200 MG tablet Take 1 tablet (200 mg total) by mouth daily.   anastrozole (ARIMIDEX) 1 MG tablet Take 1 tablet (1 mg total) by mouth daily.   Calcium Carbonate-Vitamin D (CALCIUM-VITAMIN D) 500-200 MG-UNIT per tablet Take 1 tablet by mouth 2 (two) times daily.   ELIQUIS 5 MG TABS tablet TAKE 1 TABLET BY MOUTH TWICE A DAY   furosemide (LASIX)  20 MG tablet Take 1 tablet (20 mg total) by mouth daily as needed (swelling/wt gain).   metoprolol succinate (TOPROL-XL) 50 MG 24 hr tablet Take 1.5 tablets (75 mg total) by mouth 2 (two) times daily.   pantoprazole (PROTONIX) 40 MG tablet Take 1 tablet (40 mg total) by mouth 2 (two) times daily.   potassium chloride (KLOR-CON M) 10 MEQ tablet Take 1 tablet (10 mEq total) by mouth daily as needed (when lasix is taken).   venlafaxine XR (EFFEXOR-XR) 75 MG 24 hr capsule TAKE 1 CAPSULE BY MOUTH EVERY DAY WITH BREAKFAST   VITAMIN D PO  Take 5,000 Units by mouth daily.     Allergies:   Buprenex [buprenorphine] and Pseudoephedrine hcl   Social History   Socioeconomic History   Marital status: Married    Spouse name: Not on file   Number of children: 2   Years of education: 14   Highest education level: Not on file  Occupational History   Occupation: adm assistance    Employer: [employer]  Tobacco Use   Smoking status: Never   Smokeless tobacco: Never   Tobacco comments:    Never smoke 02/15/22  Vaping Use   Vaping Use: Never used  Substance and Sexual Activity   Alcohol use: Yes    Alcohol/week: 2.0 - 3.0 standard drinks    Types: 2 - 3 Standard drinks or equivalent per week    Comment: weekends 4-6 10/07/21   Drug use: No   Sexual activity: Yes  Other Topics Concern   Not on file  Social History Narrative   HSG, [employer] - 2 years. Married 1990. 2 dtrs - '92, '94. Marriage in good health.   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   Feels safe at home and denies abuse            Social Determinants of Radio broadcast assistant Strain: Not on file  Food Insecurity: Not on file  Transportation Needs: Not on file  Physical Activity: Not on file  Stress: Not on file  Social Connections: Not on file     Family History: The patient's family history includes Breast cancer in her sister; Cancer (age of onset: 61) in her sister; Diabetes in her maternal grandmother and mother; Heart disease in her mother; Hypertension in her mother; Lung cancer in her father; Other in her father. There is no history of Colon cancer, Esophageal cancer, or Stomach cancer.  ROS:   Please see the history of present illness.    All other systems reviewed and are negative.  EKGs/Labs/Other Studies Reviewed:    The following studies were reviewed today:  July 07, 2021 esophageal barium swallow study No  stricture Small type I hiatal hernia  February 09, 2022 echo Mildly reduced LV function, 45% Normal RV function Mild to moderate MR  October 17, 2021 echo Severely reduced LV function, 30% Mildly reduced RV function Moderate to severe MR   EKG:  The ekg ordered today demonstrates atrial fibrillation.  Ventricular rate of 52 bpm.  QTc is 466 ms.   Recent Labs: 10/07/2021: B Natriuretic Peptide 272.4 10/14/2021: Hemoglobin 12.5; Platelets 270 11/11/2021: ALT 47; BUN 15; Creatinine, Ser 0.71; Potassium 4.0; Sodium 139; TSH 3.386  Recent Lipid Panel    Component Value Date/Time   CHOL 168 12/19/2016 0000   TRIG 74 12/19/2016 0000   HDL 58 12/19/2016 0000  CHOLHDL 5 07/30/2015 0732   VLDL 27.0 07/30/2015 0732   LDLCALC 95 12/19/2016 0000   LDLDIRECT 131.0 05/01/2013 1558    Physical Exam:    VS:  BP 110/74   Pulse (!) 52   Ht 6' (1.829 m)   Wt 239 lb 12.8 oz (108.8 kg)   SpO2 96%   BMI 32.52 kg/m     Wt Readings from Last 3 Encounters:  04/12/22 239 lb 12.8 oz (108.8 kg)  03/09/22 234 lb 6.4 oz (106.3 kg)  02/15/22 227 lb (103 kg)     GEN:  Well nourished, well developed in no acute distress HEENT: Normal NECK: No JVD; No carotid bruits LYMPHATICS: No lymphadenopathy CARDIAC: RRR, no murmurs, rubs, gallops RESPIRATORY:  Clear to auscultation without rales, wheezing or rhonchi  ABDOMEN: Soft, non-tender, non-distended MUSCULOSKELETAL:  No edema; No deformity  SKIN: Warm and dry NEUROLOGIC:  Alert and oriented x 3 PSYCHIATRIC:  Normal affect       ASSESSMENT:    1. Persistent atrial fibrillation ([place])   2. Nonrheumatic mitral valve regurgitation   3. Tachycardia induced cardiomyopathy (HCC)    PLAN:    In order of problems listed above:  #Persistent atrial fibrillation Symptomatic with a history of tachycardia related cardiomyopathy.  Rhythm control is indicated. OnI have seen, examined the patient, and reviewed the above assessment and plan.     Discussed treatment options today for her AF including antiarrhythmic drug therapy and ablation. Discussed risks, recovery and likelihood of success. Discussed potential need for repeat ablation procedures and antiarrhythmic drugs after an initial ablation. They wish to proceed with scheduling.  Risk, benefits, and alternatives to EP study and radiofrequency ablation for afib were also discussed in detail today. These risks include but are not limited to stroke, bleeding, vascular damage, tamponade, perforation, damage to the esophagus, lungs, and other structures, pulmonary vein stenosis, worsening renal function, and death. The patient understands these risk and wishes to proceed.  We will therefore proceed with catheter ablation at the next available time.  Carto, ICE, anesthesia are requested for the procedure.  Will also obtain CT PV protocol prior to the procedure to exclude LAA thrombus and further evaluate atrial anatomy.  Ablation strategy will be PVI and posterior wall.  For now she will continue amiodarone to help maintain normal rhythm.  Plan will be to discontinue amiodarone at the 96-monthfollow-up appointment following ablation if no recurrent episodes.  #Chronic systolic heart failure #Tachycardia mediated cardiomyopathy Rhythm control indicated as above.  Continue Lasix, metoprolol.   Medication Adjustments/Labs and Tests Ordered: Current medicines are reviewed at length with the patient today.  Concerns regarding medicines are outlined above.  Orders Placed This Encounter  Procedures   EKG 12-Lead   No orders of the defined types were placed in this encounter.    Signed, CHilton Cork LQuentin Ore MD, F[facility]6/05/2022 9:18 PM    Electrophysiology [facility]

## 2022-04-13 ENCOUNTER — Telehealth: Payer: Self-pay | Admitting: *Deleted

## 2022-04-13 DIAGNOSIS — Z79899 Other long term (current) drug therapy: Secondary | ICD-10-CM

## 2022-04-13 DIAGNOSIS — I4819 Other persistent atrial fibrillation: Secondary | ICD-10-CM

## 2022-04-13 NOTE — Telephone Encounter (Signed)
-----   Message from Vickie Epley, MD sent at 04/12/2022  9:21 PM EDT ----- She needs a TSH, free T4 and a CMP in the next month for amiodarone monitoring.  Thanks, Lysbeth Galas

## 2022-04-14 ENCOUNTER — Other Ambulatory Visit: Payer: BC Managed Care – PPO

## 2022-04-14 ENCOUNTER — Other Ambulatory Visit (HOSPITAL_COMMUNITY): Payer: Self-pay

## 2022-04-14 DIAGNOSIS — I4819 Other persistent atrial fibrillation: Secondary | ICD-10-CM | POA: Diagnosis not present

## 2022-04-14 DIAGNOSIS — Z79899 Other long term (current) drug therapy: Secondary | ICD-10-CM

## 2022-04-14 LAB — COMPREHENSIVE METABOLIC PANEL
ALT: 28 IU/L (ref 0–32)
AST: 27 IU/L (ref 0–40)
Albumin/Globulin Ratio: 1.9 (ref 1.2–2.2)
Albumin: 4.3 g/dL (ref 3.8–4.8)
Alkaline Phosphatase: 84 IU/L (ref 44–121)
BUN/Creatinine Ratio: 22 (ref 12–28)
BUN: 16 mg/dL (ref 8–27)
Bilirubin Total: 0.6 mg/dL (ref 0.0–1.2)
CO2: 26 mmol/L (ref 20–29)
Calcium: 9.4 mg/dL (ref 8.7–10.3)
Chloride: 104 mmol/L (ref 96–106)
Creatinine, Ser: 0.72 mg/dL (ref 0.57–1.00)
Globulin, Total: 2.3 g/dL (ref 1.5–4.5)
Glucose: 98 mg/dL (ref 70–99)
Potassium: 3.8 mmol/L (ref 3.5–5.2)
Sodium: 141 mmol/L (ref 134–144)
Total Protein: 6.6 g/dL (ref 6.0–8.5)
eGFR: 95 mL/min/{1.73_m2} (ref >59)

## 2022-04-14 LAB — T4, FREE: Free T4: 1.79 ng/dL — ABNORMAL HIGH (ref 0.82–1.77)

## 2022-04-14 LAB — TSH: TSH: 1.73 u[IU]/mL (ref 0.450–4.500)

## 2022-04-14 MED ORDER — FUROSEMIDE 20 MG PO TABS: 20.0000 mg | ORAL_TABLET | Freq: Every day | ORAL | 1 refills | Status: DC | PRN

## 2022-04-14 MED ORDER — POTASSIUM CHLORIDE CRYS ER 10 MEQ PO TBCR: 10.0000 meq | EXTENDED_RELEASE_TABLET | Freq: Every day | ORAL | 1 refills | Status: DC | PRN

## 2022-05-14 ENCOUNTER — Encounter: Payer: Self-pay | Admitting: Hematology and Oncology

## 2022-05-15 ENCOUNTER — Other Ambulatory Visit: Payer: Self-pay | Admitting: *Deleted

## 2022-05-15 DIAGNOSIS — C50512 Malignant neoplasm of lower-outer quadrant of left female breast: Secondary | ICD-10-CM

## 2022-05-15 MED ORDER — ANASTROZOLE 1 MG PO TABS: 1.0000 mg | ORAL_TABLET | Freq: Every day | ORAL | 3 refills | Status: DC

## 2022-05-22 ENCOUNTER — Other Ambulatory Visit: Payer: Self-pay | Admitting: Physician Assistant

## 2022-05-22 ENCOUNTER — Other Ambulatory Visit (HOSPITAL_COMMUNITY): Payer: Self-pay | Admitting: *Deleted

## 2022-05-22 DIAGNOSIS — M7661 Achilles tendinitis, right leg: Secondary | ICD-10-CM | POA: Diagnosis not present

## 2022-05-22 DIAGNOSIS — R1310 Dysphagia, unspecified: Secondary | ICD-10-CM

## 2022-05-22 MED ORDER — METOPROLOL SUCCINATE ER 50 MG PO TB24
75.0000 mg | ORAL_TABLET | Freq: Two times a day (BID) | ORAL | 2 refills | Status: DC
Start: 2022-05-22 — End: 2023-03-15

## 2022-05-26 ENCOUNTER — Other Ambulatory Visit: Payer: Self-pay

## 2022-05-26 DIAGNOSIS — I4819 Other persistent atrial fibrillation: Secondary | ICD-10-CM

## 2022-05-29 ENCOUNTER — Encounter: Payer: Self-pay | Admitting: Cardiology

## 2022-05-30 ENCOUNTER — Other Ambulatory Visit: Payer: Self-pay | Admitting: Hematology and Oncology

## 2022-05-30 ENCOUNTER — Other Ambulatory Visit: Payer: Self-pay | Admitting: Internal Medicine

## 2022-05-30 DIAGNOSIS — Z853 Personal history of malignant neoplasm of breast: Secondary | ICD-10-CM

## 2022-05-30 DIAGNOSIS — R921 Mammographic calcification found on diagnostic imaging of breast: Secondary | ICD-10-CM

## 2022-06-02 ENCOUNTER — Telehealth: Payer: Self-pay | Admitting: *Deleted

## 2022-06-02 NOTE — Telephone Encounter (Signed)
Left message for patient to call back for CT and ablation instructions.

## 2022-06-13 NOTE — Telephone Encounter (Signed)
Patient asked to call back, she was getting ready to go into a meeting at work.

## 2022-06-28 ENCOUNTER — Other Ambulatory Visit: Payer: Self-pay | Admitting: Hematology and Oncology

## 2022-06-28 ENCOUNTER — Ambulatory Visit
Admission: RE | Admit: 2022-06-28 | Discharge: 2022-06-28 | Disposition: A | Discharge status: Home / Self Care | Payer: BC Managed Care – PPO | Source: Ambulatory Visit | Attending: Hematology and Oncology | Admitting: Hematology and Oncology

## 2022-06-28 DIAGNOSIS — Z853 Personal history of malignant neoplasm of breast: Secondary | ICD-10-CM

## 2022-06-28 DIAGNOSIS — R921 Mammographic calcification found on diagnostic imaging of breast: Secondary | ICD-10-CM

## 2022-06-29 NOTE — Telephone Encounter (Signed)
Went over CT and ablation instructions with the patient. Answered all questions. Verbalized understanding and agreement.

## 2022-06-30 ENCOUNTER — Encounter: Payer: Self-pay | Admitting: Cardiology

## 2022-07-03 ENCOUNTER — Ambulatory Visit
Admission: RE | Admit: 2022-07-03 | Discharge: 2022-07-03 | Disposition: A | Discharge status: Home / Self Care | Payer: BC Managed Care – PPO | Source: Ambulatory Visit | Attending: Hematology and Oncology | Admitting: Hematology and Oncology

## 2022-07-03 ENCOUNTER — Other Ambulatory Visit: Payer: Self-pay | Admitting: Hematology and Oncology

## 2022-07-03 DIAGNOSIS — R921 Mammographic calcification found on diagnostic imaging of breast: Secondary | ICD-10-CM

## 2022-07-03 DIAGNOSIS — Z853 Personal history of malignant neoplasm of breast: Secondary | ICD-10-CM

## 2022-07-03 DIAGNOSIS — N6012 Diffuse cystic mastopathy of left breast: Secondary | ICD-10-CM | POA: Diagnosis not present

## 2022-07-04 ENCOUNTER — Ambulatory Visit: Payer: BC Managed Care – PPO | Attending: Cardiology

## 2022-07-04 DIAGNOSIS — I4819 Other persistent atrial fibrillation: Secondary | ICD-10-CM

## 2022-07-04 LAB — CBC WITH DIFFERENTIAL/PLATELET

## 2022-07-05 LAB — CBC WITH DIFFERENTIAL/PLATELET
Basophils Absolute: 0.1 10*3/uL (ref 0.0–0.2)
Basos: 1 %
EOS (ABSOLUTE): 0.4 10*3/uL (ref 0.0–0.4)
Eos: 5 %
Hematocrit: 36.7 % (ref 34.0–46.6)
Hemoglobin: 12.3 g/dL (ref 11.1–15.9)
Immature Grans (Abs): 0 10*3/uL (ref 0.0–0.1)
Immature Granulocytes: 0 %
Lymphocytes Absolute: 2.2 10*3/uL (ref 0.7–3.1)
Lymphs: 25 %
MCH: 29.9 pg (ref 26.6–33.0)
MCHC: 33.5 g/dL (ref 31.5–35.7)
MCV: 89 fL (ref 79–97)
Monocytes Absolute: 0.7 10*3/uL (ref 0.1–0.9)
Monocytes: 8 %
Neutrophils Absolute: 5.2 10*3/uL (ref 1.4–7.0)
Neutrophils: 61 %
Platelets: 259 10*3/uL (ref 150–450)
RBC: 4.12 x10E6/uL (ref 3.77–5.28)
RDW: 12.6 % (ref 11.7–15.4)
WBC: 8.7 10*3/uL (ref 3.4–10.8)

## 2022-07-05 LAB — BASIC METABOLIC PANEL
BUN/Creatinine Ratio: 21 (ref 12–28)
BUN: 16 mg/dL (ref 8–27)
CO2: 23 mmol/L (ref 20–29)
Calcium: 9.1 mg/dL (ref 8.7–10.3)
Chloride: 106 mmol/L (ref 96–106)
Creatinine, Ser: 0.75 mg/dL (ref 0.57–1.00)
Glucose: 83 mg/dL (ref 70–99)
Potassium: 4.2 mmol/L (ref 3.5–5.2)
Sodium: 142 mmol/L (ref 134–144)
eGFR: 90 mL/min/{1.73_m2} (ref >59)

## 2022-07-15 DIAGNOSIS — I4819 Other persistent atrial fibrillation: Secondary | ICD-10-CM | POA: Diagnosis not present

## 2022-07-17 ENCOUNTER — Telehealth (HOSPITAL_COMMUNITY): Payer: Self-pay | Admitting: *Deleted

## 2022-07-17 NOTE — Telephone Encounter (Signed)
Reaching out to patient to offer assistance regarding upcoming cardiac imaging study; pt verbalizes understanding of appt date/time, parking situation and where to check in, pre-test NPO status and verified current allergies; name and call back number provided for further questions should they arise  Gordy Clement RN Navigator Cardiac Imaging [facility] [phone] office [phone] cell  Patient to take daily medications.

## 2022-07-18 ENCOUNTER — Ambulatory Visit (HOSPITAL_BASED_OUTPATIENT_CLINIC_OR_DEPARTMENT_OTHER)
Admission: RE | Admit: 2022-07-18 | Discharge: 2022-07-18 | Disposition: A | Discharge status: Home / Self Care | Payer: BC Managed Care – PPO | Source: Ambulatory Visit | Attending: Student | Admitting: Student

## 2022-07-18 DIAGNOSIS — I4819 Other persistent atrial fibrillation: Secondary | ICD-10-CM

## 2022-07-18 MED ORDER — IOHEXOL 350 MG/ML SOLN
100.0000 mL | Freq: Once | INTRAVENOUS | Status: AC | PRN
  Administered 2022-07-18: 80 mL via INTRAVENOUS

## 2022-07-19 ENCOUNTER — Other Ambulatory Visit (HOSPITAL_COMMUNITY): Payer: Self-pay | Admitting: Physician Assistant

## 2022-07-24 NOTE — Pre-Procedure Instructions (Signed)
Instructed patient on the following items: Arrival time 0530 Nothing to eat or drink after midnight No meds AM of procedure Responsible person to drive you home and stay with you for 24 hrs  Have you missed any doses of anti-coagulant Eliquis- hasn't missed any doses    

## 2022-07-24 NOTE — Anesthesia Preprocedure Evaluation (Signed)
Anesthesia Evaluation  Patient identified by MRN, date of birth, ID band Patient awake    Reviewed: Allergy & Precautions, NPO status , Patient's Chart, lab work & pertinent test results, reviewed documented beta blocker date and time   History of Anesthesia Complications Negative for: history of anesthetic complications  Airway Mallampati: II  TM Distance: >3 FB Neck ROM: Full    Dental  (+) Dental Advisory Given, Missing   Pulmonary neg pulmonary ROS,    breath sounds clear to auscultation       Cardiovascular hypertension, Pt. on medications and Pt. on home beta blockers (-) angina+ dysrhythmias Atrial Fibrillation  Rhythm:Regular Rate:Bradycardia  02/2022 ECHO:  EF 45-50%, mildly decreased LVF, mildly dilated LV, basal-septal ASH, normal RVF, mild-mod MR, mild AI   Neuro/Psych negative neurological ROS     GI/Hepatic Neg liver ROS, GERD  Medicated and Controlled,  Endo/Other  BMI 32  Renal/GU negative Renal ROS     Musculoskeletal   Abdominal (+) + obese,   Peds  Hematology eliquis   Anesthesia Other Findings H/o breast cancer  Reproductive/Obstetrics                            Anesthesia Physical Anesthesia Plan  ASA: 3  Anesthesia Plan: General   Post-op Pain Management: Tylenol PO (pre-op)* and Minimal or no pain anticipated   Induction: Intravenous  PONV Risk Score and Plan: 3 and Ondansetron, Dexamethasone and Scopolamine patch - Pre-op  Airway Management Planned: Oral ETT  Additional Equipment: None  Intra-op Plan:   Post-operative Plan: Extubation in OR  Informed Consent: I have reviewed the patients History and Physical, chart, labs and discussed the procedure including the risks, benefits and alternatives for the proposed anesthesia with the patient or authorized representative who has indicated his/her understanding and acceptance.     Dental advisory  given  Plan Discussed with: CRNA and Surgeon  Anesthesia Plan Comments:        Anesthesia Quick Evaluation

## 2022-07-25 ENCOUNTER — Ambulatory Visit (HOSPITAL_COMMUNITY): Payer: BC Managed Care – PPO | Admitting: Anesthesiology

## 2022-07-25 ENCOUNTER — Ambulatory Visit (HOSPITAL_COMMUNITY)
Admission: RE | Admit: 2022-07-25 | Discharge: 2022-07-25 | Disposition: A | Discharge status: Home / Self Care | Payer: BC Managed Care – PPO | Attending: Cardiology | Admitting: Cardiology

## 2022-07-25 ENCOUNTER — Other Ambulatory Visit: Payer: Self-pay

## 2022-07-25 ENCOUNTER — Encounter (HOSPITAL_COMMUNITY): Payer: Self-pay | Admitting: Cardiology

## 2022-07-25 ENCOUNTER — Ambulatory Visit (HOSPITAL_COMMUNITY)
Admission: RE | Disposition: A | Discharge status: Home / Self Care | Payer: BC Managed Care – PPO | Source: Home / Self Care | Attending: Cardiology

## 2022-07-25 DIAGNOSIS — I5022 Chronic systolic (congestive) heart failure: Secondary | ICD-10-CM | POA: Diagnosis not present

## 2022-07-25 DIAGNOSIS — R000 Tachycardia, unspecified: Secondary | ICD-10-CM | POA: Diagnosis not present

## 2022-07-25 DIAGNOSIS — K449 Diaphragmatic hernia without obstruction or gangrene: Secondary | ICD-10-CM | POA: Insufficient documentation

## 2022-07-25 DIAGNOSIS — I429 Cardiomyopathy, unspecified: Secondary | ICD-10-CM | POA: Diagnosis not present

## 2022-07-25 DIAGNOSIS — E669 Obesity, unspecified: Secondary | ICD-10-CM | POA: Diagnosis not present

## 2022-07-25 DIAGNOSIS — I4819 Other persistent atrial fibrillation: Secondary | ICD-10-CM | POA: Diagnosis not present

## 2022-07-25 DIAGNOSIS — I4891 Unspecified atrial fibrillation: Secondary | ICD-10-CM | POA: Diagnosis not present

## 2022-07-25 DIAGNOSIS — I340 Nonrheumatic mitral (valve) insufficiency: Secondary | ICD-10-CM | POA: Insufficient documentation

## 2022-07-25 DIAGNOSIS — Z79899 Other long term (current) drug therapy: Secondary | ICD-10-CM | POA: Insufficient documentation

## 2022-07-25 DIAGNOSIS — K222 Esophageal obstruction: Secondary | ICD-10-CM | POA: Insufficient documentation

## 2022-07-25 DIAGNOSIS — Z853 Personal history of malignant neoplasm of breast: Secondary | ICD-10-CM | POA: Insufficient documentation

## 2022-07-25 DIAGNOSIS — Z6832 Body mass index (BMI) 32.0-32.9, adult: Secondary | ICD-10-CM | POA: Diagnosis not present

## 2022-07-25 DIAGNOSIS — I10 Essential (primary) hypertension: Secondary | ICD-10-CM | POA: Diagnosis not present

## 2022-07-25 HISTORY — PX: ATRIAL FIBRILLATION ABLATION: EP1191

## 2022-07-25 LAB — POCT ACTIVATED CLOTTING TIME
Activated Clotting Time: 227 seconds
Activated Clotting Time: 275 seconds
Activated Clotting Time: 287 seconds
Activated Clotting Time: 329 seconds
Activated Clotting Time: 305 seconds

## 2022-07-25 SURGERY — ATRIAL FIBRILLATION ABLATION
Anesthesia: General

## 2022-07-25 MED ORDER — HEPARIN (PORCINE) IN NACL 1000-0.9 UT/500ML-% IV SOLN
INTRAVENOUS | Status: AC
  Filled 2022-07-25: qty 500

## 2022-07-25 MED ORDER — PROTAMINE SULFATE 10 MG/ML IV SOLN
INTRAVENOUS | Status: DC | PRN
  Administered 2022-07-25: 30 mg via INTRAVENOUS

## 2022-07-25 MED ORDER — ISOPROTERENOL HCL 0.2 MG/ML IJ SOLN
INTRAVENOUS | Status: DC | PRN
  Administered 2022-07-25: 2 ug/min via INTRAVENOUS

## 2022-07-25 MED ORDER — HEPARIN (PORCINE) IN NACL 1000-0.9 UT/500ML-% IV SOLN
INTRAVENOUS | Status: DC | PRN
  Administered 2022-07-25 (×3): 500 mL

## 2022-07-25 MED ORDER — HEPARIN SODIUM (PORCINE) 1000 UNIT/ML IJ SOLN
INTRAMUSCULAR | Status: AC
  Filled 2022-07-25: qty 10

## 2022-07-25 MED ORDER — LIDOCAINE HCL (CARDIAC) PF 100 MG/5ML IV SOSY
PREFILLED_SYRINGE | INTRAVENOUS | Status: DC | PRN
  Administered 2022-07-25: 60 mg via INTRAVENOUS

## 2022-07-25 MED ORDER — COLCHICINE 0.6 MG PO TABS
0.6000 mg | ORAL_TABLET | Freq: Two times a day (BID) | ORAL | Status: DC
  Administered 2022-07-25: 0.6 mg via ORAL
  Filled 2022-07-25: qty 1

## 2022-07-25 MED ORDER — PANTOPRAZOLE SODIUM 40 MG PO TBEC
40.0000 mg | DELAYED_RELEASE_TABLET | Freq: Two times a day (BID) | ORAL | Status: DC
  Administered 2022-07-25: 40 mg via ORAL
  Filled 2022-07-25: qty 1

## 2022-07-25 MED ORDER — SODIUM CHLORIDE 0.9% FLUSH: 3.0000 mL | INTRAVENOUS | Status: DC | PRN

## 2022-07-25 MED ORDER — ONDANSETRON HCL 4 MG/2ML IJ SOLN
INTRAMUSCULAR | Status: DC | PRN
  Administered 2022-07-25: 4 mg via INTRAVENOUS

## 2022-07-25 MED ORDER — ROCURONIUM BROMIDE 10 MG/ML (PF) SYRINGE
PREFILLED_SYRINGE | INTRAVENOUS | Status: DC | PRN
  Administered 2022-07-25: 20 mg via INTRAVENOUS
  Administered 2022-07-25: 60 mg via INTRAVENOUS
  Administered 2022-07-25: 20 mg via INTRAVENOUS

## 2022-07-25 MED ORDER — MIDAZOLAM HCL 5 MG/5ML IJ SOLN
INTRAMUSCULAR | Status: DC | PRN
  Administered 2022-07-25: 2 mg via INTRAVENOUS

## 2022-07-25 MED ORDER — ISOPROTERENOL HCL 0.2 MG/ML IJ SOLN
INTRAMUSCULAR | Status: AC
  Filled 2022-07-25: qty 5

## 2022-07-25 MED ORDER — HEPARIN SODIUM (PORCINE) 1000 UNIT/ML IJ SOLN
INTRAMUSCULAR | Status: DC | PRN
  Administered 2022-07-25: 2000 [IU] via INTRAVENOUS
  Administered 2022-07-25: 3000 [IU] via INTRAVENOUS
  Administered 2022-07-25: 13000 [IU] via INTRAVENOUS
  Administered 2022-07-25 (×2): 4000 [IU] via INTRAVENOUS

## 2022-07-25 MED ORDER — APIXABAN 5 MG PO TABS
5.0000 mg | ORAL_TABLET | Freq: Two times a day (BID) | ORAL | Status: DC
  Administered 2022-07-25: 5 mg via ORAL
  Filled 2022-07-25: qty 1

## 2022-07-25 MED ORDER — SCOPOLAMINE 1 MG/3DAYS TD PT72
1.0000 | MEDICATED_PATCH | TRANSDERMAL | Status: DC
  Administered 2022-07-25: 1.5 mg via TRANSDERMAL
  Filled 2022-07-25: qty 1

## 2022-07-25 MED ORDER — ONDANSETRON HCL 4 MG/2ML IJ SOLN: 4.0000 mg | Freq: Four times a day (QID) | INTRAMUSCULAR | Status: DC | PRN

## 2022-07-25 MED ORDER — PHENYLEPHRINE HCL-NACL 20-0.9 MG/250ML-% IV SOLN
INTRAVENOUS | Status: DC | PRN
  Administered 2022-07-25: 50 ug/min via INTRAVENOUS

## 2022-07-25 MED ORDER — COLCHICINE 0.6 MG PO TABS: 0.6000 mg | ORAL_TABLET | Freq: Two times a day (BID) | ORAL | 0 refills | Status: DC

## 2022-07-25 MED ORDER — SODIUM CHLORIDE 0.9 % IV SOLN: 250.0000 mL | INTRAVENOUS | Status: DC | PRN

## 2022-07-25 MED ORDER — SUGAMMADEX SODIUM 200 MG/2ML IV SOLN
INTRAVENOUS | Status: DC | PRN
  Administered 2022-07-25: 200 mg via INTRAVENOUS

## 2022-07-25 MED ORDER — EPHEDRINE SULFATE-NACL 50-0.9 MG/10ML-% IV SOSY
PREFILLED_SYRINGE | INTRAVENOUS | Status: DC | PRN
  Administered 2022-07-25: 5 mg via INTRAVENOUS
  Administered 2022-07-25: 10 mg via INTRAVENOUS
  Administered 2022-07-25 (×2): 5 mg via INTRAVENOUS

## 2022-07-25 MED ORDER — HEPARIN SODIUM (PORCINE) 1000 UNIT/ML IJ SOLN
INTRAMUSCULAR | Status: DC | PRN
  Administered 2022-07-25: 1000 [IU] via INTRAVENOUS

## 2022-07-25 MED ORDER — PHENYLEPHRINE HCL-NACL 20-0.9 MG/250ML-% IV SOLN: INTRAVENOUS | Status: DC | PRN

## 2022-07-25 MED ORDER — DEXAMETHASONE SODIUM PHOSPHATE 4 MG/ML IJ SOLN
INTRAMUSCULAR | Status: DC | PRN
  Administered 2022-07-25: 10 mg via INTRAVENOUS

## 2022-07-25 MED ORDER — PROPOFOL 10 MG/ML IV BOLUS
INTRAVENOUS | Status: DC | PRN
  Administered 2022-07-25: 100 mg via INTRAVENOUS

## 2022-07-25 MED ORDER — ACETAMINOPHEN 500 MG PO TABS
1000.0000 mg | ORAL_TABLET | Freq: Once | ORAL | Status: AC
  Administered 2022-07-25: 1000 mg via ORAL
  Filled 2022-07-25: qty 2

## 2022-07-25 MED ORDER — SODIUM CHLORIDE 0.9 % IV SOLN: INTRAVENOUS | Status: DC

## 2022-07-25 MED ORDER — ACETAMINOPHEN 325 MG PO TABS
650.0000 mg | ORAL_TABLET | ORAL | Status: DC | PRN
  Administered 2022-07-25: 650 mg via ORAL
  Filled 2022-07-25: qty 2

## 2022-07-25 MED ORDER — METOPROLOL SUCCINATE ER 50 MG PO TB24
75.0000 mg | ORAL_TABLET | Freq: Two times a day (BID) | ORAL | Status: DC
  Filled 2022-07-25: qty 1

## 2022-07-25 MED ORDER — FENTANYL CITRATE (PF) 100 MCG/2ML IJ SOLN
INTRAMUSCULAR | Status: DC | PRN
  Administered 2022-07-25: 100 ug via INTRAVENOUS

## 2022-07-25 MED ORDER — AMIODARONE HCL 200 MG PO TABS
200.0000 mg | ORAL_TABLET | Freq: Every day | ORAL | Status: DC
  Filled 2022-07-25: qty 1

## 2022-07-25 SURGICAL SUPPLY — 22 items
BLANKET WARM UNDERBOD FULL ACC (MISCELLANEOUS) ×1 IMPLANT
CATH ABLAT QDOT MICRO BI TC DF (CATHETERS) IMPLANT
CATH OCTARAY 2.0 F 3-3-3-3-3 (CATHETERS) IMPLANT
CATH PIGTAIL STEERABLE D1 8.7 (WIRE) IMPLANT
CATH S-M CIRCA TEMP PROBE (CATHETERS) IMPLANT
CATH SOUNDSTAR 3D IMAGING (CATHETERS) IMPLANT
CATH WEBSTER BI DIR CS D-F CRV (CATHETERS) IMPLANT
CLOSURE PERCLOSE PROSTYLE (VASCULAR PRODUCTS) IMPLANT
COVER SWIFTLINK CONNECTOR (BAG) ×1 IMPLANT
DEVICE CLOSURE MYNXGRIP 5F (Vascular Products) IMPLANT
MAT PREVALON FULL STRYKER (MISCELLANEOUS) IMPLANT
PACK EP LATEX FREE (CUSTOM PROCEDURE TRAY) ×1
PACK EP LF (CUSTOM PROCEDURE TRAY) ×1 IMPLANT
PAD DEFIB RADIO PHYSIO CONN (PAD) ×1 IMPLANT
PATCH CARTO3 (PAD) IMPLANT
SHEATH CARTO VIZIGO SM CVD (SHEATH) IMPLANT
SHEATH PINNACLE 11FRX10 (SHEATH) IMPLANT
SHEATH PINNACLE 6F 10CM (SHEATH) IMPLANT
SHEATH PINNACLE 7F 10CM (SHEATH) IMPLANT
SHEATH PINNACLE 8F 10CM (SHEATH) IMPLANT
SHEATH PROBE COVER 6X72 (BAG) IMPLANT
TUBING SMART ABLATE COOLFLOW (TUBING) IMPLANT

## 2022-07-25 NOTE — Anesthesia Procedure Notes (Signed)
Procedure Name: Intubation Date/Time: 07/25/2022 7:49 AM  Performed by: Eulas Post, Liandro Thelin W, CRNAPre-anesthesia Checklist: Patient identified, Emergency Drugs available, Suction available and Patient being monitored Patient Re-evaluated:Patient Re-evaluated prior to induction Oxygen Delivery Method: Circle system utilized Preoxygenation: Pre-oxygenation with 100% oxygen Induction Type: IV induction Ventilation: Mask ventilation without difficulty Laryngoscope Size: Miller and 2 Grade View: Grade I Tube type: Oral Tube size: 7.0 mm Number of attempts: 1 Airway Equipment and Method: Stylet and Oral airway Placement Confirmation: ETT inserted through vocal cords under direct vision, positive ETCO2 and breath sounds checked- equal and bilateral Secured at: 24 cm Tube secured with: Tape Dental Injury: Teeth and Oropharynx as per pre-operative assessment

## 2022-07-25 NOTE — Discharge Instructions (Signed)

## 2022-07-25 NOTE — Progress Notes (Signed)
Patient and husband was given discharge instructions. Both verbalized understanding. 

## 2022-07-25 NOTE — Transfer of Care (Signed)
Immediate Anesthesia Transfer of Care Note  Patient: Wanda Moore  Procedure(s) Performed: ATRIAL FIBRILLATION ABLATION  Patient Location: PACU  Anesthesia Type:General  Level of Consciousness: awake  Airway & Oxygen Therapy: Patient Spontanous Breathing and Patient connected to nasal cannula oxygen  Post-op Assessment: Report given to RN and Post -op Vital signs reviewed and stable  Post vital signs: Reviewed and stable  Last Vitals:  Vitals Value Taken Time  BP    Temp    Pulse    Resp    SpO2      Last Pain:  Vitals:   07/25/22 0547  TempSrc: Temporal  PainSc:          Complications: No notable events documented.

## 2022-07-25 NOTE — Anesthesia Postprocedure Evaluation (Signed)
Anesthesia Post Note  Patient: Wanda Moore  Procedure(s) Performed: ATRIAL FIBRILLATION ABLATION     Patient location during evaluation: Phase II Anesthesia Type: General Level of consciousness: awake and alert, patient cooperative and oriented Pain management: pain level controlled Vital Signs Assessment: post-procedure vital signs reviewed and stable Respiratory status: nonlabored ventilation, spontaneous breathing and respiratory function stable Cardiovascular status: blood pressure returned to baseline and stable Postop Assessment: no apparent nausea or vomiting and adequate PO intake Anesthetic complications: no   No notable events documented.  Last Vitals:  Vitals:   07/25/22 1215 07/25/22 1230  BP: 111/72 116/71  Pulse: 62 62  Resp: 18 16  Temp:    SpO2: 92% 94%    Last Pain:  Vitals:   07/25/22 1035  TempSrc: Temporal  PainSc: 0-No pain                 Chance Karam,E. Adelis Docter

## 2022-07-25 NOTE — H&P (Signed)
Electrophysiology Office Note:     Date:  07/25/2022    ID:  Wanda Moore, DOB [birth year], MRN [id number]   PCP:  Michael Boston, MD     [facility] Cardiologist:  Candee Furbish, MD  [facility] Electrophysiologist:  None    Referring MD: Oliver Barre, PA    Chief Complaint: Atrial fibrillation   History of Present Illness:     Wanda Moore is a 61 y.o. female who presents for an evaluation of atrial fibrillation at the request of Adline Peals. Their medical history includes breast cancer, melanoma, esophageal stricture and large hiatal hernia.   The patient last saw Candee Furbish on Mar 09, 2022.  She has previously had a cardioversion for her atrial fibrillation in December 2022.  She went back in A-fib a week later.  Her ejection fraction around that time was abnormal with an EF of 30% and moderate to severe MR.  She felt great while in normal rhythm.  Amiodarone was started which successfully converted her back to sinus rhythm.  Repeat echo after being in sinus rhythm showed a much improved EF of 45 to 50% with less mitral regurgitation.  She continues to be on amiodarone 200 mg by mouth once daily.  Presents for PVI today.       Objective      Past Medical History:  Diagnosis Date   Allergy     Anxiety     Barrett esophagus 2008   Breast cancer [facility])     Breast cancer of lower-outer quadrant of left female breast ([place]) 03/15/2015   Cough 08/03/2012    nonproductive   Distal radius fracture, left 12/05/2012   Diverticulosis of colon (without mention of hemorrhage)     Esophageal reflux     Hiatal hernia 2013    large   Melanoma ([place])      removed from betwen shoulder blades   Personal history of radiation therapy 2016   Recurrent UTI     Stricture and stenosis of esophagus 2013           Past Surgical History:  Procedure Laterality Date   BREAST LUMPECTOMY Left 2016   BREAST LUMPECTOMY WITH RADIOACTIVE SEED AND SENTINEL LYMPH NODE BIOPSY  Left 03/25/2015    Procedure: LEFT BREAST LUMPECTOMY WITH RADIOACTIVE SEED AND LEFT AXILLARY SENTINEL LYMPH NODE BIOPSY;  Surgeon: Alphonsa Overall, MD;  Location: [place];  Service: General;  Laterality: Left;   CARDIOVERSION N/A 10/28/2021    Procedure: CARDIOVERSION;  Surgeon: Werner Lean, MD;  Location: [place] [facility];  Service: Cardiovascular;  Laterality: N/A;   [place]       COLONOSCOPY   03/25/2007    Diverticulosis    ESOPHAGOGASTRODUODENOSCOPY   04/10/2012    HH, and Stricture   LAPAROSCOPIC NISSEN FUNDOPLICATION   12/13/7410    Procedure: LAPAROSCOPIC NISSEN FUNDOPLICATION;  Surgeon: Pedro Earls, MD;  [facility];  Service: General;  Laterality: N/A;  HIATAL HERNIA REPAIR   MELANOMA EXCISION        back   OPEN REDUCTION INTERNAL FIXATION (ORIF) DISTAL RADIAL FRACTURE   12/05/2012    Procedure: OPEN REDUCTION INTERNAL FIXATION (ORIF) DISTAL RADIAL FRACTURE;  Surgeon: Johnny Bridge, MD;  Location: [place];  Service: Orthopedics;  Laterality: Left;      Current Medications: Active Medications      Current Meds  Medication Sig   amiodarone (PACERONE) 200  MG tablet Take 1 tablet (200 mg total) by mouth daily.   anastrozole (ARIMIDEX) 1 MG tablet Take 1 tablet (1 mg total) by mouth daily.   Calcium Carbonate-Vitamin D (CALCIUM-VITAMIN D) 500-200 MG-UNIT per tablet Take 1 tablet by mouth 2 (two) times daily.   ELIQUIS 5 MG TABS tablet TAKE 1 TABLET BY MOUTH TWICE A DAY   furosemide (LASIX) 20 MG tablet Take 1 tablet (20 mg total) by mouth daily as needed (swelling/wt gain).   metoprolol succinate (TOPROL-XL) 50 MG 24 hr tablet Take 1.5 tablets (75 mg total) by mouth 2 (two) times daily.   pantoprazole (PROTONIX) 40 MG tablet Take 1 tablet (40 mg total) by mouth 2 (two) times daily.   potassium chloride (KLOR-CON M) 10 MEQ tablet Take 1 tablet (10 mEq total) by mouth daily as needed (when lasix is  taken).   venlafaxine XR (EFFEXOR-XR) 75 MG 24 hr capsule TAKE 1 CAPSULE BY MOUTH EVERY DAY WITH BREAKFAST   VITAMIN D PO Take 5,000 Units by mouth daily.        Allergies:   Buprenex [buprenorphine] and Pseudoephedrine hcl    Social History         Socioeconomic History   Marital status: Married      Spouse name: Not on file   Number of children: 2   Years of education: 14   Highest education level: Not on file  Occupational History   Occupation: adm assistance      Employer: [employer]  Tobacco Use   Smoking status: Never   Smokeless tobacco: Never   Tobacco comments:      Never smoke 02/15/22  Vaping Use   Vaping Use: Never used  Substance and Sexual Activity   Alcohol use: Yes      Alcohol/week: 2.0 - 3.0 standard drinks      Types: 2 - 3 Standard drinks or equivalent per week      Comment: weekends 4-6 10/07/21   Drug use: No   Sexual activity: Yes  Other Topics Concern   Not on file  Social History Narrative    HSG, [employer] - 2 years. Married 1990. 2 dtrs - '92, '94. Marriage in good health.    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    Feels safe at home and denies abuse                   Social Determinants of Adult nurse Strain: Not on file  Food Insecurity: Not on file  Transportation Needs: Not on file  Physical Activity: Not on file  Stress: Not on file  Social Connections: Not on file      Family History: The patient's family history includes Breast cancer in her sister; Cancer (age of onset: 64) in her sister; Diabetes in her maternal grandmother and mother; Heart disease in her mother; Hypertension in her mother; Lung cancer in her father; Other in her father. There is no history of Colon cancer, Esophageal cancer, or Stomach cancer.   ROS:   Please see the history of present illness.    All other systems reviewed and are  negative.   EKGs/Labs/Other Studies Reviewed:     The following studies were reviewed today:   July 07, 2021 esophageal barium swallow study No stricture Small type I hiatal hernia   February 09, 2022 echo Mildly  reduced LV function, 45% Normal RV function Mild to moderate MR  October 17, 2021 echo Severely reduced LV function, 30% Mildly reduced RV function Moderate to severe MR     EKG:  The ekg ordered today demonstrates atrial fibrillation.  Ventricular rate of 52 bpm.  QTc is 466 ms.     Recent Labs: 10/07/2021: B Natriuretic Peptide 272.4 10/14/2021: Hemoglobin 12.5; Platelets 270 11/11/2021: ALT 47; BUN 15; Creatinine, Ser 0.71; Potassium 4.0; Sodium 139; TSH 3.386  Recent Lipid Panel Labs (Brief)          Component Value Date/Time    CHOL 168 12/19/2016 0000    TRIG 74 12/19/2016 0000    HDL 58 12/19/2016 0000    CHOLHDL 5 07/30/2015 0732    VLDL 27.0 07/30/2015 0732    LDLCALC 95 12/19/2016 0000    LDLDIRECT 131.0 05/01/2013 1558        Physical Exam:     VS:  BP 147/77   Pulse 47   Ht 6' (1.829 m)   Wt 239 lb 12.8 oz (108.8 kg)   SpO2 96%   BMI 32.52 kg/m         Wt Readings from Last 3 Encounters:  04/12/22 239 lb 12.8 oz (108.8 kg)  03/09/22 234 lb 6.4 oz (106.3 kg)  02/15/22 227 lb (103 kg)      GEN:  Well nourished, well developed in no acute distress HEENT: Normal NECK: No JVD; No carotid bruits LYMPHATICS: No lymphadenopathy CARDIAC: RRR, no murmurs, rubs, gallops RESPIRATORY:  Clear to auscultation without rales, wheezing or rhonchi  ABDOMEN: Soft, non-tender, non-distended MUSCULOSKELETAL:  No edema; No deformity  SKIN: Warm and dry NEUROLOGIC:  Alert and oriented x 3 PSYCHIATRIC:  Normal affect          Assessment ASSESSMENT:     1. Persistent atrial fibrillation ([place])   2. Nonrheumatic mitral valve regurgitation   3. Tachycardia induced cardiomyopathy (HCC)     PLAN:     In order of problems listed above:    #Persistent atrial fibrillation Symptomatic with a history of tachycardia related cardiomyopathy.  Rhythm control is indicated. OnI have seen, examined the patient, and reviewed the above assessment and plan.     Discussed treatment options today for her AF including antiarrhythmic drug therapy and ablation. Discussed risks, recovery and likelihood of success. Discussed potential need for repeat ablation procedures and antiarrhythmic drugs after an initial ablation. They wish to proceed with scheduling.   Risk, benefits, and alternatives to EP study and radiofrequency ablation for afib were also discussed in detail today. These risks include but are not limited to stroke, bleeding, vascular damage, tamponade, perforation, damage to the esophagus, lungs, and other structures, pulmonary vein stenosis, worsening renal function, and death. The patient understands these risk and wishes to proceed.  We will therefore proceed with catheter ablation at the next available time.  Carto, ICE, anesthesia are requested for the procedure.  Will also obtain CT PV protocol prior to the procedure to exclude LAA thrombus and further evaluate atrial anatomy.   Ablation strategy will be PVI and posterior wall.   For now she will continue amiodarone to help maintain normal rhythm.  Plan will be to discontinue amiodarone at the 79-monthfollow-up appointment following ablation if no recurrent episodes.   #Chronic systolic heart failure #Tachycardia mediated cardiomyopathy Rhythm control indicated as above.  Continue Lasix, metoprolol.    Presents for PVI.     Signed, CHilton Cork LQuentin Ore MD,  [facility], [facility] 07/25/2022 Electrophysiology [facility]

## 2022-07-26 MED FILL — Heparin Sod (Porcine)-NaCl IV Soln 1000 Unit/500ML-0.9%: INTRAVENOUS | Qty: 500 | Status: AC

## 2022-08-03 ENCOUNTER — Other Ambulatory Visit (HOSPITAL_COMMUNITY): Payer: Self-pay

## 2022-08-03 MED ORDER — AMIODARONE HCL 200 MG PO TABS: 200.0000 mg | ORAL_TABLET | Freq: Every day | ORAL | 1 refills | Status: DC

## 2022-08-22 ENCOUNTER — Encounter (HOSPITAL_COMMUNITY): Payer: Self-pay | Admitting: Physician Assistant

## 2022-08-22 ENCOUNTER — Ambulatory Visit (HOSPITAL_COMMUNITY)
Admission: RE | Admit: 2022-08-22 | Discharge: 2022-08-22 | Disposition: A | Discharge status: Home / Self Care | Payer: BC Managed Care – PPO | Source: Ambulatory Visit | Attending: Physician Assistant | Admitting: Physician Assistant

## 2022-08-22 VITALS — BP 122/88 | HR 65 | Ht 72.0 in | Wt 243.0 lb

## 2022-08-22 DIAGNOSIS — I5022 Chronic systolic (congestive) heart failure: Secondary | ICD-10-CM | POA: Insufficient documentation

## 2022-08-22 DIAGNOSIS — Z79899 Other long term (current) drug therapy: Secondary | ICD-10-CM | POA: Insufficient documentation

## 2022-08-22 DIAGNOSIS — I4819 Other persistent atrial fibrillation: Secondary | ICD-10-CM | POA: Diagnosis not present

## 2022-08-22 DIAGNOSIS — I38 Endocarditis, valve unspecified: Secondary | ICD-10-CM | POA: Insufficient documentation

## 2022-08-22 DIAGNOSIS — Z7901 Long term (current) use of anticoagulants: Secondary | ICD-10-CM | POA: Diagnosis not present

## 2022-08-22 NOTE — Progress Notes (Signed)
Primary Care Physician: Michael Boston, MD Primary Cardiologist: Dr Marlou Porch Primary Electrophysiologist: Dr Quentin Ore Referring Physician: Dr Delma Post Wanda Moore is a 61 y.o. female with a history of new onset atrial fibrillation who presents for follow up in the [facility]. The patient was initially diagnosed with atrial fibrillation 10/05/21 after presenting to her PCP with symptoms of SOB. ECG showed afib with rapid rates. She was started on metoprolol for rate control. Patient has a CHADS2VASC score of 1. She was sent by her PCP for a CT to rule out PE which was negative. It did show small pleural effusions and peribrochovascular ground glass in lower lobes, consistent with CHF. She reports that she had been fatigued for two months leading up to this diagnosis. She also reported orthopnea.   Patient is s/p DCCV on 10/28/21. Unfortunately, she was back in afib 11/02/21 with heart racing and fatigue. Her BB was increased. Echo showed EF 30-35%, moderate-severe MR. She was started on amiodarone with plan to recheck echo once in SR. Patient reports that she converted to [name] 11/10/21, confirmed on her smart watch. Patient had an echo which showed improvement in her EF, up to 45-50%, MR improved in SR.  On follow up today, patient is s/p afib ablation with Dr Quentin Ore on 07/25/22. She reports that she has done very well since the procedure with no episodes of afib. She denies chest pain, swallowing pain, or groin issues. No bleeding issues on anticoagulation.   Today, she denies symptoms of palpitations, chest pain, PND, lower extremity edema, orthopnea, dizziness, presyncope, syncope, snoring, daytime somnolence, bleeding, or neurologic sequela. The patient is tolerating medications without difficulties and is otherwise without complaint today.    Atrial Fibrillation Risk Factors:  she does not have symptoms or diagnosis of sleep apnea. she does not have a history  of rheumatic fever. she does not have a history of alcohol use. The patient does not have a history of early familial atrial fibrillation or other arrhythmias.  she has a BMI of Body mass index is 32.96 kg/m.Marland Kitchen Filed Weights   08/22/22 1129  Weight: 110.2 kg    Family History  Problem Relation Age of Onset   Hypertension Mother    Diabetes Mother    Heart disease Mother    Lung cancer Father    Other Father        father was adopted - no paternal family history information   Breast cancer Sister    Cancer Sister 47       breast (half sister - same mother)   Diabetes Maternal Grandmother    Colon cancer Neg Hx    Esophageal cancer Neg Hx    Stomach cancer Neg Hx      Atrial Fibrillation Management history:  Previous antiarrhythmic drugs: amiodarone  Previous cardioversions: 10/28/21 Previous ablations: 07/25/22 CHADS2VASC score: 2 Anticoagulation history: Eliquis   Past Medical History:  Diagnosis Date   Allergy    Anxiety    Barrett esophagus 2008   Breast cancer ([place])    Breast cancer of lower-outer quadrant of left female breast ([place]) 03/15/2015   Cough 08/03/2012   nonproductive   Distal radius fracture, left 12/05/2012   Diverticulosis of colon (without mention of hemorrhage)    Esophageal reflux    Hiatal hernia 2013   large   Melanoma ([place])    removed from betwen shoulder blades   Personal history of radiation therapy 2016   Recurrent  UTI    Stricture and stenosis of esophagus 2013   Past Surgical History:  Procedure Laterality Date   ATRIAL FIBRILLATION ABLATION N/A 07/25/2022   Procedure: ATRIAL FIBRILLATION ABLATION;  Surgeon: Vickie Epley, MD;  Location: [place] [facility];  Service: Cardiovascular;  Laterality: N/A;   BREAST LUMPECTOMY Left 2016   BREAST LUMPECTOMY WITH RADIOACTIVE SEED AND SENTINEL LYMPH NODE BIOPSY Left 03/25/2015   Procedure: LEFT BREAST LUMPECTOMY WITH RADIOACTIVE SEED AND LEFT AXILLARY SENTINEL LYMPH NODE BIOPSY;   Surgeon: Alphonsa Overall, MD;  Location: [place];  Service: General;  Laterality: Left;   CARDIOVERSION N/A 10/28/2021   Procedure: CARDIOVERSION;  Surgeon: Werner Lean, MD;  Location: [place] [facility];  Service: Cardiovascular;  Laterality: N/A;   [place]     COLONOSCOPY  03/25/2007   Diverticulosis    ESOPHAGOGASTRODUODENOSCOPY  04/10/2012   HH, and Stricture   LAPAROSCOPIC NISSEN FUNDOPLICATION  25/07/5637   Procedure: LAPAROSCOPIC NISSEN FUNDOPLICATION;  Surgeon: Pedro Earls, MD;  [facility];  Service: General;  Laterality: N/A;  HIATAL HERNIA REPAIR   MELANOMA EXCISION     back   OPEN REDUCTION INTERNAL FIXATION (ORIF) DISTAL RADIAL FRACTURE  12/05/2012   Procedure: OPEN REDUCTION INTERNAL FIXATION (ORIF) DISTAL RADIAL FRACTURE;  Surgeon: Johnny Bridge, MD;  Location: [place];  Service: Orthopedics;  Laterality: Left;    Current Outpatient Medications  Medication Sig Dispense Refill   amiodarone (PACERONE) 200 MG tablet Take 1 tablet (200 mg total) by mouth daily. 90 tablet 1   anastrozole (ARIMIDEX) 1 MG tablet Take 1 tablet (1 mg total) by mouth daily. 90 tablet 3   Calcium Carbonate-Vitamin D (CALCIUM-VITAMIN D) 500-200 MG-UNIT per tablet Take 1 tablet by mouth 2 (two) times daily.     ELIQUIS 5 MG TABS tablet TAKE 1 TABLET BY MOUTH TWICE A DAY 60 tablet 11   furosemide (LASIX) 20 MG tablet TAKE 1 TABLET (20 MG TOTAL) BY MOUTH DAILY AS NEEDED (SWELLING/WT GAIN). 90 tablet 1   KLOR-CON M10 10 MEQ tablet TAKE 1 TABLET (10 MEQ TOTAL) BY MOUTH DAILY AS NEEDED (WHEN LASIX IS TAKEN). 90 tablet 1   metoprolol succinate (TOPROL-XL) 50 MG 24 hr tablet Take 1.5 tablets (75 mg total) by mouth 2 (two) times daily. 270 tablet 2   pantoprazole (PROTONIX) 40 MG tablet Take 1 tablet (40 mg total) by mouth 2 (two) times daily before a meal. 180 tablet 3   venlafaxine XR (EFFEXOR-XR) 75 MG 24 hr capsule TAKE 1  CAPSULE BY MOUTH EVERY DAY WITH BREAKFAST 90 capsule 3   VITAMIN D PO Take 5,000 Units by mouth daily.     No current facility-administered medications for this encounter.    Allergies  Allergen Reactions   Buprenex [Buprenorphine] Itching   Pseudoephedrine Hcl Itching    Social History   Socioeconomic History   Marital status: Married    Spouse name: Not on file   Number of children: 2   Years of education: 14   Highest education level: Not on file  Occupational History   Occupation: adm assistance    Employer: [employer]  Tobacco Use   Smoking status: Never   Smokeless tobacco: Never   Tobacco comments:    Never smoke 02/15/22  Vaping Use   Vaping Use: Never used  Substance and Sexual Activity   Alcohol use: Yes    Alcohol/week: 2.0 - 3.0 standard drinks of alcohol  Types: 2 - 3 Standard drinks or equivalent per week    Comment: weekends 4-6 10/07/21   Drug use: No   Sexual activity: Yes  Other Topics Concern   Not on file  Social History Narrative   HSG, [employer] - 2 years. Married 1990. 2 dtrs - '92, '94. Marriage in good health.   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   Feels safe at home and denies abuse            Social Determinants of Health   Financial Resource Strain: Not on file  Food Insecurity: Not on file  Transportation Needs: Not on file  Physical Activity: Not on file  Stress: Not on file  Social Connections: Not on file  Intimate Partner Violence: Not on file     ROS- All systems are reviewed and negative except as per the HPI above.  Physical Exam: Vitals:   08/22/22 1129  BP: 122/88  Pulse: 65  Weight: 110.2 kg  Height: 6' (1.829 m)     GEN- The patient is a well appearing female, alert and oriented x 3 today.   HEENT-head normocephalic, atraumatic, sclera clear, conjunctiva pink, hearing intact, trachea midline. Lungs-  Clear to ausculation bilaterally, normal work of breathing Heart- Regular rate and rhythm, no murmurs, rubs or gallops  GI- soft, NT, ND, + BS Extremities- no clubbing, cyanosis, or edema MS- no significant deformity or atrophy Skin- no rash or lesion Psych- euthymic mood, full affect Neuro- strength and sensation are intact   Wt Readings from Last 3 Encounters:  08/22/22 110.2 kg  07/25/22 108.9 kg  04/12/22 108.8 kg    EKG today demonstrates  SR Vent. rate 65 BPM PR interval 180 ms QRS duration 88 ms QT/QTcB 466/484 ms  Echo 10/17/21 demonstrated  1. Left ventricular ejection fraction, by estimation, is 30 to 35%. The  left ventricle has moderately decreased function. The left ventricle  demonstrates global hypokinesis. The left ventricular internal cavity size was moderately dilated. Left ventricular diastolic parameters are indeterminate.   2. Right ventricular systolic function is mildly reduced. The right  ventricular size is normal. There is normal pulmonary artery systolic  pressure.   3. Mitral regurgitation is significant with jets directed both posterior  and centrally Would recomm TEE to further define mechanism, severity. Moderate to severe mitral valve regurgitation.   4. The aortic valve is tricuspid. Aortic valve regurgitation is not  visualized. Aortic valve sclerosis is present, with no evidence of aortic  valve stenosis.   5. Aortic dilatation noted. There is mild dilatation of the ascending  aorta, measuring 39 mm.   6. The inferior vena cava is dilated in size with <50% respiratory  variability, suggesting right atrial pressure of 15 mmHg.    Epic records are reviewed at length today  CHA2DS2-VASc Score = 2  The patient's score is based upon: CHF History: 1 HTN History: 0 Diabetes History: 0 Stroke History: 0 Vascular Disease History: 0 Age Score: 0 Gender Score: 1       ASSESSMENT AND PLAN: 1. Persistent Atrial Fibrillation (ICD10:   I48.19) The patient's CHA2DS2-VASc score is 2, indicating a 2.2% annual risk of stroke.   S/p afib ablation 07/25/22 Patient appears to be maintaining SR. Continue amiodarone 200 mg daily for now. Hopefully, will be able to discontinue at 3 month follow up. Continue Eliquis 5 mg BID with no missed  doses for 3 months post ablation.  Continue Toprol 75 mg BID Smart watch for home monitoring.   2. Valvular heart disease  MR in SR mild to moderate.   3. HFrEF EF improved from 30-35% to 45-50% with SR. Fluid status appears stable.    Follow up with Dr Marlou Porch and Dr Quentin Ore as scheduled.    [facility] [address] [place], [place] [address] [phone] 08/22/2022 11:44 AM

## 2022-08-28 ENCOUNTER — Encounter: Payer: Self-pay | Admitting: Cardiology

## 2022-09-11 ENCOUNTER — Ambulatory Visit: Payer: BC Managed Care – PPO | Attending: Cardiology | Admitting: Cardiology

## 2022-09-11 ENCOUNTER — Encounter: Payer: Self-pay | Admitting: Cardiology

## 2022-09-11 VITALS — BP 120/80 | HR 63 | Ht 72.0 in | Wt 244.0 lb

## 2022-09-11 DIAGNOSIS — R000 Tachycardia, unspecified: Secondary | ICD-10-CM

## 2022-09-11 DIAGNOSIS — I4819 Other persistent atrial fibrillation: Secondary | ICD-10-CM

## 2022-09-11 DIAGNOSIS — I43 Cardiomyopathy in diseases classified elsewhere: Secondary | ICD-10-CM

## 2022-09-11 NOTE — Patient Instructions (Signed)
Medication Instructions:  The current medical regimen is effective;  continue present plan and medications.  *If you need a refill on your cardiac medications before your next appointment, please call your pharmacy*  Follow-Up: At  HeartCare, you and your health needs are our priority.  As part of our continuing mission to provide you with exceptional heart care, we have created designated Provider Care Teams.  These Care Teams include your primary Cardiologist (physician) and Advanced Practice Providers (APPs -  Physician Assistants and Nurse Practitioners) who all work together to provide you with the care you need, when you need it.  We recommend signing up for the patient portal called "MyChart".  Sign up information is provided on this After Visit Summary.  MyChart is used to connect with patients for Virtual Visits (Telemedicine).  Patients are able to view lab/test results, encounter notes, upcoming appointments, etc.  Non-urgent messages can be sent to your provider as well.   To learn more about what you can do with MyChart, go to [email or web address].    Your next appointment:   6 month(s)  The format for your next appointment:   In Person  Provider:   Mark Skains, MD      Important Information About Sugar       
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
## 2022-10-13 ENCOUNTER — Other Ambulatory Visit: Payer: Self-pay | Admitting: Hematology and Oncology

## 2022-10-13 DIAGNOSIS — C50512 Malignant neoplasm of lower-outer quadrant of left female breast: Secondary | ICD-10-CM

## 2022-10-20 NOTE — Progress Notes (Signed)
Patient Care Team: Michael Boston, MD as PCP - General (Internal Medicine) Jerline Pain, MD as PCP - Cardiology (Cardiology) Marchia Bond, MD as Consulting Physician (Orthopedic Surgery) Johnathan Hausen, MD as Consulting Physician (General Surgery) Druscilla Brownie, MD as Consulting Physician (Dermatology) Gus Height, MD (Inactive) as Consulting Physician (Obstetrics and Gynecology) Barbaraann Cao, OD as Referring Physician (Optometry) Sable Feil, MD as Consulting Physician (Gastroenterology) Alphonsa Overall, MD as Consulting Physician (General Surgery) Nicholas Lose, MD as Consulting Physician (Hematology and Oncology) Thea Silversmith, MD as Consulting Physician (Radiation Oncology) Mauro Kaufmann, RN as Registered Nurse Rockwell Germany, RN as Registered Nurse Holley Bouche, NP (Inactive) as Nurse Practitioner (Nurse Practitioner) Sylvan Cheese, NP as Nurse Practitioner (Hematology and Oncology)  DIAGNOSIS: No diagnosis found.  SUMMARY OF ONCOLOGIC HISTORY: Oncology History  Breast cancer of lower-outer quadrant of left female breast ([place])  10/14/2013 Mammogram   Left breast: new calcifications warranting further imaging   11/04/2013 Mammogram   Left breast: There is a 2 mm group of round calcifications in the LOQ posteriorly. No associated mass or architectural distortion. F/U in 6 months.   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   03/25/2015 Definitive Surgery   Left lumpectomy/[facility] [name]): Invasive ductal carcinoma, grade 2, measuring 2.1 cm, negative for LVID, HER2/neu repeated and remains negative (ratio 1.36), DCIS with necrosis and calcifications, 1 LN removed and negative for maliganacy (0/1)    03/25/2015 Pathologic Stage   Stage IIA: pT2 pN0   03/25/2015 Oncotype testing   Score 13 (8% ROR). No chemotherapy  [name]).   05/03/2015 - 06/08/2015 Radiation Therapy   Adjuvant RT [name]): Left breast 45 Gy over 25 fractions. Left breast boost 16 Gy over 8 fractions. Total dose 61 Gy   06/28/2015 -  Anti-estrogen oral therapy   Anastrozole 1 mg daily. Planned duration of therapy 5 years.     CHIEF COMPLIANT:   INTERVAL HISTORY: Wanda Moore is a   ALLERGIES:  is allergic to buprenex [buprenorphine] and pseudoephedrine hcl.  MEDICATIONS:  Current Outpatient Medications  Medication Sig Dispense Refill   amiodarone (PACERONE) 200 MG tablet Take 1 tablet (200 mg total) by mouth daily. 90 tablet 1   anastrozole (ARIMIDEX) 1 MG tablet Take 1 tablet (1 mg total) by mouth daily. 90 tablet 3   Calcium Carbonate-Vitamin D (CALCIUM-VITAMIN D) 500-200 MG-UNIT per tablet Take 1 tablet by mouth 2 (two) times daily.     ELIQUIS 5 MG TABS tablet TAKE 1 TABLET BY MOUTH TWICE A DAY 60 tablet 11   furosemide (LASIX) 20 MG tablet TAKE 1 TABLET (20 MG TOTAL) BY MOUTH DAILY AS NEEDED (SWELLING/WT GAIN). 90 tablet 1   KLOR-CON M10 10 MEQ tablet TAKE 1 TABLET (10 MEQ TOTAL) BY MOUTH DAILY AS NEEDED (WHEN LASIX IS TAKEN). 90 tablet 1   metoprolol succinate (TOPROL-XL) 50 MG 24 hr tablet Take 1.5 tablets (75 mg total) by  mouth 2 (two) times daily. 270 tablet 2   pantoprazole (PROTONIX) 40 MG tablet Take 1 tablet (40 mg total) by mouth 2 (two) times daily before a meal. 180 tablet 3   venlafaxine XR (EFFEXOR-XR) 75 MG 24 hr capsule TAKE 1 CAPSULE BY MOUTH EVERY DAY WITH  BREAKFAST 90 capsule 3   VITAMIN D PO Take 5,000 Units by mouth daily.     No current facility-administered medications for this visit.    PHYSICAL EXAMINATION: ECOG PERFORMANCE STATUS: {CHL ONC ECOG [phone]}  There were no vitals filed for this visit. There were no vitals filed for this visit.  BREAST:*** No palpable masses or nodules in either right or left breasts. No palpable axillary supraclavicular or infraclavicular adenopathy no breast tenderness or nipple discharge. (exam performed in the presence of a chaperone)  LABORATORY DATA:  I have reviewed the data as listed    Latest Ref Rng & Units 07/04/2022    8:09 AM 04/14/2022    8:12 AM 11/11/2021   12:27 PM  CMP  Glucose 70 - 99 mg/dL 83  98  89   BUN 8 - 27 mg/dL _0 Creatinine 0.57 - 1.00 mg/dL 0.75  0.72  0.71   Sodium 134 - 144 mmol/L 142  141  139   Potassium 3.5 - 5.2 mmol/L 4.2  3.8  4.0   Chloride 96 - 106 mmol/L 106  104  106   CO2 20 - 29 mmol/L _1 Calcium 8.7 - 10.3 mg/dL 9.1  9.4  9.3   Total Protein 6.0 - 8.5 g/dL  6.6  6.8   Total Bilirubin 0.0 - 1.2 mg/dL  0.6  1.2   Alkaline Phos 44 - 121 IU/L  84  70   AST 0 - 40 IU/L  27  41   ALT 0 - 32 IU/L  28  47     Lab Results  Component Value Date   WBC 8.7 07/04/2022   HGB 12.3 07/04/2022   HCT 36.7 07/04/2022   MCV 89 07/04/2022   PLT 259 07/04/2022   NEUTROABS 5.2 07/04/2022    ASSESSMENT & PLAN:  No problem-specific Assessment & Plan notes found for this encounter.    No orders of the defined types were placed in this encounter.  The patient has a good understanding of the overall plan. she agrees with it. she will call with any problems that may develop before the next visit here. Total time spent: 30 mins including face to face time and time spent for planning, charting and co-ordination of care   Suzzette Righter, [place] 10/20/22    I Gardiner Coins am acting as a Education administrator for [employer]  ***

## 2022-10-23 ENCOUNTER — Inpatient Hospital Stay: Payer: BC Managed Care – PPO | Attending: Hematology and Oncology | Admitting: Hematology and Oncology

## 2022-10-23 VITALS — BP 155/77 | HR 74 | Temp 97.8°F | Resp 18 | Ht 72.0 in | Wt 245.9 lb

## 2022-10-23 DIAGNOSIS — Z923 Personal history of irradiation: Secondary | ICD-10-CM | POA: Diagnosis not present

## 2022-10-23 DIAGNOSIS — Z170 Estrogen receptor positive status [ER+]: Secondary | ICD-10-CM | POA: Insufficient documentation

## 2022-10-23 DIAGNOSIS — Z79811 Long term (current) use of aromatase inhibitors: Secondary | ICD-10-CM | POA: Insufficient documentation

## 2022-10-23 DIAGNOSIS — C50512 Malignant neoplasm of lower-outer quadrant of left female breast: Secondary | ICD-10-CM | POA: Diagnosis not present
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
## 2022-10-24 ENCOUNTER — Ambulatory Visit: Payer: BC Managed Care – PPO | Admitting: Cardiology

## 2022-12-04 ENCOUNTER — Encounter: Payer: Self-pay | Admitting: Cardiology

## 2022-12-04 ENCOUNTER — Ambulatory Visit: Payer: BC Managed Care – PPO | Attending: Cardiology | Admitting: Cardiology

## 2022-12-04 VITALS — BP 112/76 | HR 67 | Ht 72.0 in | Wt 244.0 lb

## 2022-12-04 DIAGNOSIS — I4819 Other persistent atrial fibrillation: Secondary | ICD-10-CM

## 2022-12-04 DIAGNOSIS — I5022 Chronic systolic (congestive) heart failure: Secondary | ICD-10-CM

## 2022-12-04 NOTE — Progress Notes (Deleted)
Electrophysiology Office Follow up Visit Note:    Date:  12/04/2022   ID:  Wanda Moore, DOB 1961-08-11, MRN [id number]  PCP:  Michael Boston, MD  [facility] Cardiologist:  Wanda Furbish, MD  [facility] Electrophysiologist:  Wanda Epley, MD    Interval History:    Wanda Moore is a 62 y.o. female who presents for a follow up visit.  She had an A-fib ablation July 25, 2022.  During the ablation the veins and posterior wall were isolated.  She takes Eliquis for stroke prophylaxis.  She saw Dr. Marlou Porch July 12, 2022 and reported no residual atrial fibrillation.  She is on amiodarone.       Past Medical History:  Diagnosis Date   Allergy    Anxiety    Barrett esophagus 2008   Breast cancer [facility])    Breast cancer of lower-outer quadrant of left female breast ([place]) 03/15/2015   Cough 08/03/2012   nonproductive   Distal radius fracture, left 12/05/2012   Diverticulosis of colon (without mention of hemorrhage)    Esophageal reflux    Hiatal hernia 2013   large   Melanoma ([place])    removed from betwen shoulder blades   Personal history of radiation therapy 2016   Recurrent UTI    Stricture and stenosis of esophagus 2013    Past Surgical History:  Procedure Laterality Date   ATRIAL FIBRILLATION ABLATION N/A 07/25/2022   Procedure: ATRIAL FIBRILLATION ABLATION;  Surgeon: Wanda Epley, MD;  Location: [place] [facility];  Service: Cardiovascular;  Laterality: N/A;   BREAST LUMPECTOMY Left 2016   BREAST LUMPECTOMY WITH RADIOACTIVE SEED AND SENTINEL LYMPH NODE BIOPSY Left 03/25/2015   Procedure: LEFT BREAST LUMPECTOMY WITH RADIOACTIVE SEED AND LEFT AXILLARY SENTINEL LYMPH NODE BIOPSY;  Surgeon: Wanda Overall, MD;  Location: [place];  Service: General;  Laterality: Left;   CARDIOVERSION N/A 10/28/2021   Procedure: CARDIOVERSION;  Surgeon: Werner Lean, MD;  Location: [place] [facility];  Service: Cardiovascular;   Laterality: N/A;   [place]     COLONOSCOPY  03/25/2007   Diverticulosis    ESOPHAGOGASTRODUODENOSCOPY  04/10/2012   HH, and Stricture   LAPAROSCOPIC NISSEN FUNDOPLICATION  06/12/5783   Procedure: LAPAROSCOPIC NISSEN FUNDOPLICATION;  Surgeon: Wanda Earls, MD;  [facility];  Service: General;  Laterality: N/A;  HIATAL HERNIA REPAIR   MELANOMA EXCISION     back   OPEN REDUCTION INTERNAL FIXATION (ORIF) DISTAL RADIAL FRACTURE  12/05/2012   Procedure: OPEN REDUCTION INTERNAL FIXATION (ORIF) DISTAL RADIAL FRACTURE;  Surgeon: Wanda Bridge, MD;  Location: [place];  Service: Orthopedics;  Laterality: Left;    Current Medications: No outpatient medications have been marked as taking for the 12/04/22 encounter (Appointment) with Wanda Epley, MD.     Allergies:   Buprenex [buprenorphine] and Pseudoephedrine hcl   Social History   Socioeconomic History   Marital status: Married    Spouse name: Not on file   Number of children: 2   Years of education: 14   Highest education level: Not on file  Occupational History   Occupation: adm assistance    Employer: [employer]  Tobacco Use   Smoking status: Never   Smokeless tobacco: Never   Tobacco comments:    Never smoke 02/15/22  Vaping Use   Vaping Use: Never used  Substance and Sexual Activity   Alcohol use: Yes    Alcohol/week: 2.0 -  3.0 standard drinks of alcohol    Types: 2 - 3 Standard drinks or equivalent per week    Comment: weekends 4-6 10/07/21   Drug use: No   Sexual activity: Yes  Other Topics Concern   Not on file  Social History Narrative   HSG, [employer] - 2 years. Married 1990. 2 dtrs - '92, '94. Marriage in good health.   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   Feels safe at home and denies abuse            Social Determinants of Adult nurse Strain: Not on file  Food Insecurity: Not on file  Transportation Needs: Not on file  Physical Activity: Not on file  Stress: Not on file  Social Connections: Not on file     Family History: The patient's family history includes Breast cancer in her sister; Cancer (age of onset: 51) in her sister; Diabetes in her maternal grandmother and mother; Heart disease in her mother; Hypertension in her mother; Lung cancer in her father; Other in her father. There is no history of Colon cancer, Esophageal cancer, or Stomach cancer.  ROS:   Please see the history of present illness.    All other systems reviewed and are negative.  EKGs/Labs/Other Studies Reviewed:    The following studies were reviewed today:    EKG:  The ekg ordered today demonstrates ***  Recent Labs: 04/14/2022: ALT 28; TSH 1.730 07/04/2022: BUN 16; Creatinine, Ser 0.75; Hemoglobin 12.3; Platelets 259; Potassium 4.2; Sodium 142  Recent Lipid Panel    Component Value Date/Time   CHOL 168 12/19/2016 0000   TRIG 74 12/19/2016 0000   HDL 58 12/19/2016 0000   CHOLHDL 5 07/30/2015 0732   VLDL 27.0 07/30/2015 0732   LDLCALC 95 12/19/2016 0000   LDLDIRECT 131.0 05/01/2013 1558    Physical Exam:    VS:  There were no vitals taken for this visit.    Wt Readings from Last 3 Encounters:  10/23/22 245 lb 14.4 oz (111.5 kg)  09/11/22 244 lb (110.7 kg)  08/22/22 243 lb (110.2 kg)     GEN: *** Well nourished, well developed in no acute distress CARDIAC: ***RRR, no murmurs, rubs, gallops RESPIRATORY:  Clear to auscultation without rales, wheezing or rhonchi        ASSESSMENT:    No diagnosis found. PLAN:    In order of problems listed above:  #Persistent atrial fibrillation Maintaining sinus rhythm after her catheter ablation in September 2023. Stop amiodarone Continue Eliquis  #Chronic systolic heart failure NYHA class II.  Warm and dry on exam.  EF as low as 30% in the past, 45% in  April 2023.  Follows with Dr. Marlou Porch.  Continue GDMT.  Repeat echo given she has been maintaining sinus rhythm.   Follow-up 1 year with APP.    Medication Adjustments/Labs and Tests Ordered: Current medicines are reviewed at length with the patient today.  Concerns regarding medicines are outlined above.  No orders of the defined types were placed in this encounter.  No orders of the defined types were placed in this encounter.    Signed, Lars Mage, MD, [facility], [facility] 12/04/2022 4:45 AM    Electrophysiology [facility]

## 2022-12-04 NOTE — Progress Notes (Signed)
Electrophysiology Office Follow up Visit Note:    Date:  12/04/2022   ID:  Wanda Moore, DOB 1961-03-01, MRN [id number]  PCP:  Wanda Boston, MD  [facility] Cardiologist:  Wanda Furbish, MD  [facility] Electrophysiologist:  Wanda Epley, MD    Interval History:    Wanda Moore is a 62 y.o. female who presents for a follow up visit.  She had an A-fib ablation July 25, 2022.  During the ablation the veins and posterior wall were isolated.  She takes Eliquis for stroke prophylaxis.  She saw Dr. Marlou Moore July 12, 2022 and reported no residual atrial fibrillation.  She is on amiodarone.  Today, she reports that her heart rhythm has been good recently. She monitors her rhythm with her apple watch.  She denies any chest pain, shortness of breath, or peripheral edema. No lightheadedness, headaches, syncope, orthopnea, or PND.     Past Medical History:  Diagnosis Date   Allergy    Anxiety    Barrett esophagus 2008   Breast cancer [facility])    Breast cancer of lower-outer quadrant of left female breast ([place]) 03/15/2015   Cough 08/03/2012   nonproductive   Distal radius fracture, left 12/05/2012   Diverticulosis of colon (without mention of hemorrhage)    Esophageal reflux    Hiatal hernia 2013   large   Melanoma ([place])    removed from betwen shoulder blades   Personal history of radiation therapy 2016   Recurrent UTI    Stricture and stenosis of esophagus 2013    Past Surgical History:  Procedure Laterality Date   ATRIAL FIBRILLATION ABLATION N/A 07/25/2022   Procedure: ATRIAL FIBRILLATION ABLATION;  Surgeon: Wanda Epley, MD;  Location: [place] [facility];  Service: Cardiovascular;  Laterality: N/A;   BREAST LUMPECTOMY Left 2016   BREAST LUMPECTOMY WITH RADIOACTIVE SEED AND SENTINEL LYMPH NODE BIOPSY Left 03/25/2015   Procedure: LEFT BREAST LUMPECTOMY WITH RADIOACTIVE SEED AND LEFT AXILLARY SENTINEL LYMPH NODE BIOPSY;  Surgeon: Wanda Overall,  MD;  Location: [place];  Service: General;  Laterality: Left;   CARDIOVERSION N/A 10/28/2021   Procedure: CARDIOVERSION;  Surgeon: Werner Lean, MD;  Location: [place] [facility];  Service: Cardiovascular;  Laterality: N/A;   [place]     COLONOSCOPY  03/25/2007   Diverticulosis    ESOPHAGOGASTRODUODENOSCOPY  04/10/2012   HH, and Stricture   LAPAROSCOPIC NISSEN FUNDOPLICATION  91/02/7828   Procedure: LAPAROSCOPIC NISSEN FUNDOPLICATION;  Surgeon: Wanda Earls, MD;  [facility];  Service: General;  Laterality: N/A;  HIATAL HERNIA REPAIR   MELANOMA EXCISION     back   OPEN REDUCTION INTERNAL FIXATION (ORIF) DISTAL RADIAL FRACTURE  12/05/2012   Procedure: OPEN REDUCTION INTERNAL FIXATION (ORIF) DISTAL RADIAL FRACTURE;  Surgeon: Wanda Bridge, MD;  Location: [place];  Service: Orthopedics;  Laterality: Left;    Current Medications: Current Meds  Medication Sig   amiodarone (PACERONE) 200 MG tablet Take 1 tablet (200 mg total) by mouth daily.   Calcium Carbonate-Vitamin D (CALCIUM-VITAMIN D) 500-200 MG-UNIT per tablet Take 1 tablet by mouth 2 (two) times daily.   ELIQUIS 5 MG TABS tablet TAKE 1 TABLET BY MOUTH TWICE A DAY   furosemide (LASIX) 20 MG tablet TAKE 1 TABLET (20 MG TOTAL) BY MOUTH DAILY AS NEEDED (SWELLING/WT GAIN).   KLOR-CON M10 10 MEQ tablet TAKE 1 TABLET (10 MEQ TOTAL) BY MOUTH DAILY AS NEEDED (WHEN  LASIX IS TAKEN).   metoprolol succinate (TOPROL-XL) 50 MG 24 hr tablet Take 1.5 tablets (75 mg total) by mouth 2 (two) times daily.   pantoprazole (PROTONIX) 40 MG tablet Take 1 tablet (40 mg total) by mouth 2 (two) times daily before a meal.   VITAMIN D PO Take 5,000 Units by mouth daily.     Allergies:   Buprenex [buprenorphine] and Pseudoephedrine hcl   Social History   Socioeconomic History   Marital status: Married    Spouse name: Not on file   Number of children: 2   Years of education:  14   Highest education level: Not on file  Occupational History   Occupation: adm assistance    Employer: [employer]  Tobacco Use   Smoking status: Never   Smokeless tobacco: Never   Tobacco comments:    Never smoke 02/15/22  Vaping Use   Vaping Use: Never used  Substance and Sexual Activity   Alcohol use: Yes    Alcohol/week: 2.0 - 3.0 standard drinks of alcohol    Types: 2 - 3 Standard drinks or equivalent per week    Comment: weekends 4-6 10/07/21   Drug use: No   Sexual activity: Yes  Other Topics Concern   Not on file  Social History Narrative   HSG, [employer] - 2 years. Married 1990. 2 dtrs - '92, '94. Marriage in good health.   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   Feels safe at home and denies abuse            Social Determinants of Radio broadcast assistant Strain: Not on file  Food Insecurity: Not on file  Transportation Needs: Not on file  Physical Activity: Not on file  Stress: Not on file  Social Connections: Not on file     Family History: The patient's family history includes Breast cancer in her sister; Cancer (age of onset: 66) in her sister; Diabetes in her maternal grandmother and mother; Heart disease in her mother; Hypertension in her mother; Lung cancer in her father; Other in her father. There is no history of Colon cancer, Esophageal cancer, or Stomach cancer.  ROS:   Please see the history of present illness.     All other systems reviewed and are negative.  EKGs/Labs/Other Studies Reviewed:    The following studies were reviewed today:    EKG:  EKG is personally reviewed. 12/04/22: Sinus rhythm.  Ventricular rate 67 bpm.  Recent Labs: 04/14/2022: ALT 28; TSH 1.730 07/04/2022: BUN 16; Creatinine, Ser 0.75; Hemoglobin 12.3; Platelets 259; Potassium 4.2; Sodium 142  Recent Lipid Panel    Component Value Date/Time   CHOL 168  12/19/2016 0000   TRIG 74 12/19/2016 0000   HDL 58 12/19/2016 0000   CHOLHDL 5 07/30/2015 0732   VLDL 27.0 07/30/2015 0732   LDLCALC 95 12/19/2016 0000   LDLDIRECT 131.0 05/01/2013 1558    Physical Exam:    VS:  BP 112/76   Pulse 67   Ht 6' (1.829 m)   Wt 244 lb (110.7 kg)   BMI 33.09 kg/m     Wt Readings from Last 3 Encounters:  12/04/22 244 lb (110.7 kg)  10/23/22 245 lb 14.4 oz (111.5 kg)  09/11/22 244 lb (110.7 kg)     GEN:  Well nourished, well developed in no acute distress CARDIAC: RRR, no murmurs, rubs, gallops RESPIRATORY:  Clear  to auscultation without rales, wheezing or rhonchi        ASSESSMENT:    1. Persistent atrial fibrillation ([place])   2. Chronic systolic heart failure ([facility])    PLAN:    In order of problems listed above:  #Persistent atrial fibrillation Maintaining sinus rhythm after her catheter ablation in September 2023. Stop amiodarone Continue Eliquis  #Chronic systolic heart failure NYHA class II.  Warm and dry on exam.  EF as low as 30% in the past, 45% in April 2023.  Follows with Dr. Marlou Moore.  Continue GDMT.   Follow-up : 6 months with APP  Medication Adjustments/Labs and Tests Ordered: Current medicines are reviewed at length with the patient today.  Concerns regarding medicines are outlined above.  No orders of the defined types were placed in this encounter.  No orders of the defined types were placed in this encounter.   I,Mitra Faeizi,acting as a Education administrator for Wanda Epley, MD.,have documented all relevant documentation on the behalf of Wanda Epley, MD,as directed by  Wanda Epley, MD while in the presence of Wanda Epley, MD.  I, Wanda Epley, MD, have reviewed all documentation for this visit. The documentation on 12/04/22 for the exam, diagnosis, procedures, and orders are all accurate and complete.   Signed, Lars Mage, MD, [id number] [facility], [facility] 12/04/2022 1:56 PM    Electrophysiology [facility]

## 2022-12-04 NOTE — Addendum Note (Signed)
Addended by: Bernestine Amass on: 12/04/2022 03:52 PM   Modules accepted: Orders

## 2022-12-04 NOTE — Patient Instructions (Signed)
Medication Instructions:  Your physician has recommended you make the following change in your medication:  1) STOP taking amiodarone   *If you need a refill on your cardiac medications before your next appointment, please call your pharmacy*  Follow-Up: At [facility], you and your health needs are our priority.  As part of our continuing mission to provide you with exceptional heart care, we have created designated Provider Care Teams.  These Care Teams include your primary Cardiologist (physician) and Advanced Practice Providers (APPs -  Physician Assistants and Nurse Practitioners) who all work together to provide you with the care you need, when you need it.  Your next appointment:   6 month(s)  Provider:   You will see one of the following Advanced Practice Providers on your designated Care Team:   Tommye Standard, [place]" [place], [place], NP

## 2023-01-09 DIAGNOSIS — E559 Vitamin D deficiency, unspecified: Secondary | ICD-10-CM | POA: Diagnosis not present

## 2023-01-09 DIAGNOSIS — K219 Gastro-esophageal reflux disease without esophagitis: Secondary | ICD-10-CM | POA: Diagnosis not present

## 2023-01-15 DIAGNOSIS — L814 Other melanin hyperpigmentation: Secondary | ICD-10-CM | POA: Diagnosis not present

## 2023-01-15 DIAGNOSIS — L821 Other seborrheic keratosis: Secondary | ICD-10-CM | POA: Diagnosis not present

## 2023-01-16 DIAGNOSIS — Z1339 Encounter for screening examination for other mental health and behavioral disorders: Secondary | ICD-10-CM | POA: Diagnosis not present

## 2023-01-16 DIAGNOSIS — Z1331 Encounter for screening for depression: Secondary | ICD-10-CM | POA: Diagnosis not present

## 2023-01-16 DIAGNOSIS — I5022 Chronic systolic (congestive) heart failure: Secondary | ICD-10-CM | POA: Diagnosis not present

## 2023-01-16 DIAGNOSIS — Z0000 Encounter for general adult medical examination without abnormal findings: Secondary | ICD-10-CM | POA: Diagnosis not present

## 2023-01-16 DIAGNOSIS — R413 Other amnesia: Secondary | ICD-10-CM | POA: Diagnosis not present

## 2023-01-26 ENCOUNTER — Encounter: Payer: Self-pay | Admitting: Cardiology

## 2023-02-19 DIAGNOSIS — Z01419 Encounter for gynecological examination (general) (routine) without abnormal findings: Secondary | ICD-10-CM | POA: Diagnosis not present

## 2023-02-19 DIAGNOSIS — Z6833 Body mass index (BMI) 33.0-33.9, adult: Secondary | ICD-10-CM | POA: Diagnosis not present

## 2023-02-19 DIAGNOSIS — Z124 Encounter for screening for malignant neoplasm of cervix: Secondary | ICD-10-CM | POA: Diagnosis not present

## 2023-02-22 ENCOUNTER — Encounter: Payer: Self-pay | Admitting: Cardiology

## 2023-02-22 ENCOUNTER — Other Ambulatory Visit: Payer: Self-pay | Admitting: *Deleted

## 2023-02-22 DIAGNOSIS — I4819 Other persistent atrial fibrillation: Secondary | ICD-10-CM

## 2023-02-22 MED ORDER — APIXABAN 5 MG PO TABS: 5.0000 mg | ORAL_TABLET | Freq: Two times a day (BID) | ORAL | 5 refills | Status: DC

## 2023-02-22 NOTE — Telephone Encounter (Signed)
Eliquis 5mg  refill request received. Patient is 62 years old, weight-110.7kg, Crea-0.75 on 07/04/22, Diagnosis-Afib, and last seen by Dr. Lalla Brothers on 12/04/22. Dose is appropriate based on dosing criteria. Will send in refill to requested pharmacy.

## 2023-03-12 DIAGNOSIS — D225 Melanocytic nevi of trunk: Secondary | ICD-10-CM | POA: Diagnosis not present

## 2023-03-12 DIAGNOSIS — L814 Other melanin hyperpigmentation: Secondary | ICD-10-CM | POA: Diagnosis not present

## 2023-03-12 DIAGNOSIS — L821 Other seborrheic keratosis: Secondary | ICD-10-CM | POA: Diagnosis not present

## 2023-03-12 DIAGNOSIS — L237 Allergic contact dermatitis due to plants, except food: Secondary | ICD-10-CM | POA: Diagnosis not present

## 2023-03-15 ENCOUNTER — Other Ambulatory Visit (HOSPITAL_COMMUNITY): Payer: Self-pay | Admitting: Physician Assistant

## 2023-03-16 ENCOUNTER — Ambulatory Visit: Payer: BC Managed Care – PPO | Attending: Cardiology | Admitting: Cardiology

## 2023-03-16 ENCOUNTER — Encounter: Payer: Self-pay | Admitting: Cardiology

## 2023-03-16 VITALS — BP 127/82 | HR 64 | Ht 72.0 in | Wt 248.0 lb

## 2023-03-16 DIAGNOSIS — R000 Tachycardia, unspecified: Secondary | ICD-10-CM

## 2023-03-16 DIAGNOSIS — I5022 Chronic systolic (congestive) heart failure: Secondary | ICD-10-CM

## 2023-03-16 DIAGNOSIS — I4819 Other persistent atrial fibrillation: Secondary | ICD-10-CM | POA: Diagnosis not present

## 2023-03-16 DIAGNOSIS — I43 Cardiomyopathy in diseases classified elsewhere: Secondary | ICD-10-CM

## 2023-03-16 MED ORDER — EMPAGLIFLOZIN 10 MG PO TABS: 10.0000 mg | ORAL_TABLET | Freq: Every day | ORAL | 3 refills | Status: DC

## 2023-03-16 NOTE — Progress Notes (Signed)
Cardiology Office Note:    Date:  03/16/2023   ID:  Wanda Moore, DOB Jul 14, 1961, MRN [id number]  PCP:  Melida Quitter, Moore   [facility] Providers Cardiologist:  Donato Schultz, Moore Electrophysiologist:  Lanier Prude, Moore     Referring Moore: Melida Quitter, Moore    History of Present Illness:    Wanda Moore is a 62 y.o. female here for the follow-up  of atrial fibrillation post ablation 07/2022 Dr. Lalla Brothers with prior tachycardia induced cardiomyopathy at the request Wanda Moore.  Has been seen in the atrial fibrillation clinic originally diagnosed on 10/05/2021 after presenting with shortness of breath to her PCP.  EKG showed atrial fibrillation with rapid rates.  She was placed on metoprolol, CHADSVASc was 2 (heart failure and female).  CT scan was performed that was negative for PE.  She had cardioversion on 10/28/2021 and went back into atrial fibrillation a week later.  Beta-blocker was increased but her echo showed EF of 30% with moderate to severe MR.  While in sinus rhythm she felt great.  She was started on amiodarone peri ablation, now off.    She did have a repeat echo that showed EF of 45 to 50% mitral regurgitation also improved.  On Eliquis and Toprol 75 twice daily  She is still feeling shortness of breath with activity when going up stairs or going up hills when walking.  We are going to try Jardiance.  Mother had CHF. MI later age.   Non smoker     Past Medical History:  Diagnosis Date   Allergy    Anxiety    Barrett esophagus 2008   Breast cancer [facility])    Breast cancer of lower-outer quadrant of left female breast (HCC) 03/15/2015   Cough 08/03/2012   nonproductive   Distal radius fracture, left 12/05/2012   Diverticulosis of colon (without mention of hemorrhage)    Esophageal reflux    Hiatal hernia 2013   large   Melanoma (HCC)    removed from betwen shoulder blades   Personal history of radiation therapy 2016   Recurrent UTI     Stricture and stenosis of esophagus 2013    Past Surgical History:  Procedure Laterality Date   ATRIAL FIBRILLATION ABLATION N/A 07/25/2022   Procedure: ATRIAL FIBRILLATION ABLATION;  Surgeon: Lanier Prude, Moore;  [facility];  Service: Cardiovascular;  Laterality: N/A;   BREAST LUMPECTOMY Left 2016   BREAST LUMPECTOMY WITH RADIOACTIVE SEED AND SENTINEL LYMPH NODE BIOPSY Left 03/25/2015   Procedure: LEFT BREAST LUMPECTOMY WITH RADIOACTIVE SEED AND LEFT AXILLARY SENTINEL LYMPH NODE BIOPSY;  Surgeon: Wanda Moore;  Location: [facility];  Service: General;  Laterality: Left;   CARDIOVERSION N/A 10/28/2021   Procedure: CARDIOVERSION;  Surgeon: Christell Constant, Moore;  Location: [facility];  Service: Cardiovascular;  Laterality: N/A;   CESAREAN SECTION  1992   CHOLECYSTECTOMY     COLONOSCOPY  03/25/2007   Diverticulosis    ESOPHAGOGASTRODUODENOSCOPY  04/10/2012   HH, and Stricture   LAPAROSCOPIC NISSEN FUNDOPLICATION  08/09/2012   Procedure: LAPAROSCOPIC NISSEN FUNDOPLICATION;  Surgeon: Valarie Merino, Moore;  Location: [facility];  Service: General;  Laterality: N/A;  HIATAL HERNIA REPAIR   MELANOMA EXCISION     back   OPEN REDUCTION INTERNAL FIXATION (ORIF) DISTAL RADIAL FRACTURE  12/05/2012   Procedure: OPEN REDUCTION INTERNAL FIXATION (ORIF) DISTAL RADIAL FRACTURE;  Surgeon: Eulas Post, Moore;  Location: [facility];  Service: Orthopedics;  Laterality: Left;    Current Medications: Current Meds  Medication Sig   apixaban (ELIQUIS) 5 MG TABS tablet Take 1 tablet (5 mg total) by mouth 2 (two) times daily.   Calcium Carbonate-Vitamin D (CALCIUM-VITAMIN D) 500-200 MG-UNIT per tablet Take 1 tablet by mouth 2 (two) times daily.   empagliflozin (JARDIANCE) 10 MG TABS tablet Take 1 tablet (10 mg total) by mouth daily before breakfast.   furosemide (LASIX) 20 MG tablet TAKE 1 TABLET (20 MG TOTAL) BY MOUTH DAILY AS NEEDED (SWELLING/WT  GAIN).   KLOR-CON M10 10 MEQ tablet TAKE 1 TABLET (10 MEQ TOTAL) BY MOUTH DAILY AS NEEDED (WHEN LASIX IS TAKEN).   metoprolol succinate (TOPROL-XL) 50 MG 24 hr tablet TAKE 1.5 TABLETS BY MOUTH 2 TIMES DAILY.   pantoprazole (PROTONIX) 40 MG tablet Take 1 tablet (40 mg total) by mouth 2 (two) times daily before a meal.   VITAMIN D PO Take 5,000 Units by mouth daily.     Allergies:   Buprenex [buprenorphine] and Pseudoephedrine hcl   Social History   Socioeconomic History   Marital status: Married    Spouse name: Not on file   Number of children: 2   Years of education: 14   Highest education level: Not on file  Occupational History   Occupation: adm assistance    Employer: [employer]  Tobacco Use   Smoking status: Never   Smokeless tobacco: Never   Tobacco comments:    Never smoke 02/15/22  Vaping Use   Vaping Use: Never used  Substance and Sexual Activity   Alcohol use: Yes    Alcohol/week: 2.0 - 3.0 standard drinks of alcohol    Types: 2 - 3 Standard drinks or equivalent per week    Comment: weekends 4-6 10/07/21   Drug use: No   Sexual activity: Yes  Other Topics Concern   Not on file  Social History Narrative   HSG, [employer] - 2 years. Married 1990. 2 dtrs - '92, '94. Marriage in good health.   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   Feels safe at home and denies abuse            Social Determinants of Corporate investment banker Strain: Not on file  Food Insecurity: Not on file  Transportation Needs: Not on file  Physical Activity: Not on file  Stress: Not on file  Social Connections: Not on file     Family History: The patient's family history includes Breast cancer in her sister; Cancer (age of onset: 76) in her sister; Diabetes in her maternal grandmother and mother; Heart disease in her mother; Hypertension in her mother; Lung cancer in her father; Other  in her father. There is no history of Colon cancer, Esophageal cancer, or Stomach cancer.  ROS:   Please see the history of present illness.     All other systems reviewed and are negative.  EKGs/Labs/Other Studies Reviewed:    The following studies were reviewed today:  Atrial Fibrillation Ablation 07/25/2022: CONCLUSIONS: 1. Successful PVI 2. Successful ablation/isolation of the posterior wall 3. Intracardiac echo reveals trivial pericardial effusion and normal left atrial architecture 4. No early apparent complications. 5.  Colchicine 0.6 mg by mouth twice daily for 5 days 6.  Protonix 40 mg by mouth once daily for 45 days  CT Cardiac & CA Score 07/18/2022: IMPRESSION: 1. There  is normal pulmonary vein drainage into the left atrium with ostial measurements above.   2. There is no thrombus in the left atrial appendage.   3. The esophagus runs in the left atrial midline and is not in proximity to any of the pulmonary vein ostia.   4. No PFO/ASD.   5. Normal coronary origin. Right dominance.   6. CAC score of 0 which is 0 percentile for age-, race-, and sex-matched controls.   7. Mild aortic atherosclerosis.  ECHO 02/09/22:  1. Left ventricular ejection fraction, by estimation, is 45 to 50%. The  left ventricle has mildly decreased function. The left ventricle  demonstrates global hypokinesis. The left ventricular internal cavity size  was mildly dilated. There is mild  asymmetric left ventricular hypertrophy of the basal-septal segment. Left  ventricular diastolic parameters are indeterminate.   2. Right ventricular systolic function is normal. The right ventricular  size is mildly enlarged. Tricuspid regurgitation signal is inadequate for  assessing PA pressure.   3. The mitral valve is grossly normal. Mild to moderate mitral valve  regurgitation. No evidence of mitral stenosis.   4. The aortic valve is tricuspid. Aortic valve regurgitation is mild. No  aortic  stenosis is present.   5. The inferior vena cava is normal in size with greater than 50%  respiratory variability, suggesting right atrial pressure of 3 mmHg.   Comparison(s): Changes from prior study are noted. EF has improved to  45-50%. Mild to moderate MR.   EKG: EKG is personally reviewed. 09/11/2022: EKG was not ordered. 08/22/2022: NSR, 65 bpm 02/15/2022: sinus rhythm 54 bpm  Recent Labs: 04/14/2022: ALT 28; TSH 1.730 07/04/2022: BUN 16; Creatinine, Ser 0.75; Hemoglobin 12.3; Platelets 259; Potassium 4.2; Sodium 142  Recent Lipid Panel    Component Value Date/Time   CHOL 168 12/19/2016 0000   TRIG 74 12/19/2016 0000   HDL 58 12/19/2016 0000   CHOLHDL 5 07/30/2015 0732   VLDL 27.0 07/30/2015 0732   LDLCALC 95 12/19/2016 0000   LDLDIRECT 131.0 05/01/2013 1558     Risk Assessment/Calculations:              Physical Exam:    VS:  BP 127/82 (BP Location: Left Arm, Patient Position: Sitting, Cuff Size: Normal)   Pulse 64   Ht 6' (1.829 m)   Wt 248 lb (112.5 kg)   BMI 33.63 kg/m     Wt Readings from Last 3 Encounters:  03/16/23 248 lb (112.5 kg)  12/04/22 244 lb (110.7 kg)  10/23/22 245 lb 14.4 oz (111.5 kg)     GEN: Well nourished, well developed, in no acute distress HEENT: normal Neck: no JVD, carotid bruits, or masses Cardiac: RRR; no murmurs, rubs, or gallops,no edema  Respiratory:  clear to auscultation bilaterally, normal work of breathing GI: soft, nontender, nondistended, + BS MS: no deformity or atrophy Skin: warm and dry, no rash Neuro:  Alert and Oriented x 3, Strength and sensation are intact Psych: euthymic mood, full affect   ASSESSMENT:    1. Persistent atrial fibrillation (HCC)   2. Chronic systolic heart failure (HCC)   3. Tachycardia induced cardiomyopathy (HCC)      PLAN:    In order of problems listed above:  Persistent atrial fibrillation - Ablation 07/2022-Dr. Lalla Brothers.  The veins and posterior wall were isolated Doing well.   No palpitations.  Off of amiodarone 200 mg a day > 3 months after ablation.   - Continue with Eliquis 5 mg twice  a day.  No signs of bleeding.  Hemoglobin and creatinine have been monitored closely. - Dyspnea when going up stairs at work still feeling, walking up hills etc..  Discussed.  Continue with daily exercise efforts.  30 minutes a day of walking, continuing to use the stairs at work is excellent for her.  Will try Jardiance 10 mg a day to see if this helps per guidelines of both diastolic as well as mild systolic heart failure.  Tachycardia induced cardiomyopathy - In the past her EF was in the 30% range, improved as above up to 50%.  Continuing with Toprol at this time.  Thoughts were that this was partly tachycardia induced.  Will trial Jardiance 10 mg.  Overall doing very well.  She very rarely takes furosemide 20 mg as needed.  Follow-up: 64-months APPP  Medication Adjustments/Labs and Tests Ordered: Current medicines are reviewed at length with the patient today.  Concerns regarding medicines are outlined above.  No orders of the defined types were placed in this encounter.  Meds ordered this encounter  Medications   empagliflozin (JARDIANCE) 10 MG TABS tablet    Sig: Take 1 tablet (10 mg total) by mouth daily before breakfast.    Dispense:  30 tablet    Refill:  3    Patient Instructions  Medication Instructions:  Please start Jardiance 10 mg once a day. Continue all other medications as listed.  *If you need a refill on your cardiac medications before your next appointment, please call your pharmacy*  Follow-Up: At [facility], you and your health needs are our priority.  As part of our continuing mission to provide you with exceptional heart care, we have created designated Provider Care Teams.  These Care Teams include your primary Cardiologist (physician) and Advanced Practice Providers (APPs -  Physician Assistants and Nurse Practitioners) who all work  together to provide you with the care you need, when you need it.  We recommend signing up for the patient portal called "MyChart".  Sign up information is provided on this After Visit Summary.  MyChart is used to connect with patients for Virtual Visits (Telemedicine).  Patients are able to view lab/test results, encounter notes, upcoming appointments, etc.  Non-urgent messages can be sent to your provider as well.   To learn more about what you can do with MyChart, go to [email or web address].    Your next appointment:   6 month(s)  Provider:   Jari Favre, PA-C, Robin Searing, NP, Jacolyn Reedy, PA-C, Eligha Bridegroom, NP, or Tereso Newcomer, PA-C              Signed, Donato Schultz, Moore  03/16/2023 8:55 AM    [facility]

## 2023-03-16 NOTE — Patient Instructions (Signed)
Medication Instructions:  Please start Jardiance 10 mg once a day. Continue all other medications as listed.  *If you need a refill on your cardiac medications before your next appointment, please call your pharmacy*  Follow-Up: At [facility], you and your health needs are our priority.  As part of our continuing mission to provide you with exceptional heart care, we have created designated Provider Care Teams.  These Care Teams include your primary Cardiologist (physician) and Advanced Practice Providers (APPs -  Physician Assistants and Nurse Practitioners) who all work together to provide you with the care you need, when you need it.  We recommend signing up for the patient portal called "MyChart".  Sign up information is provided on this After Visit Summary.  MyChart is used to connect with patients for Virtual Visits (Telemedicine).  Patients are able to view lab/test results, encounter notes, upcoming appointments, etc.  Non-urgent messages can be sent to your provider as well.   To learn more about what you can do with MyChart, go to [email or web address].    Your next appointment:   6 month(s)  Provider:   Jari Favre, PA-C, Robin Searing, NP, Jacolyn Reedy, PA-C, Eligha Bridegroom, NP, or Tereso Newcomer, PA-C

## 2023-03-20 ENCOUNTER — Encounter: Payer: Self-pay | Admitting: Cardiology

## 2023-04-10 ENCOUNTER — Ambulatory Visit
Admission: RE | Admit: 2023-04-10 | Discharge: 2023-04-10 | Disposition: A | Discharge status: Home / Self Care | Payer: BC Managed Care – PPO | Source: Ambulatory Visit | Attending: Family Medicine | Admitting: Family Medicine

## 2023-04-10 VITALS — BP 130/80 | HR 65 | Temp 98.2°F | Resp 17

## 2023-04-10 DIAGNOSIS — L237 Allergic contact dermatitis due to plants, except food: Secondary | ICD-10-CM | POA: Diagnosis not present

## 2023-04-10 MED ORDER — PREDNISONE 10 MG (21) PO TBPK: ORAL_TABLET | Freq: Every day | ORAL | 0 refills | Status: DC

## 2023-04-10 MED ORDER — METHYLPREDNISOLONE SODIUM SUCC 125 MG IJ SOLR
125.0000 mg | Freq: Once | INTRAMUSCULAR | Status: AC
  Administered 2023-04-10: 125 mg via INTRAMUSCULAR

## 2023-04-10 NOTE — ED Triage Notes (Signed)
Pt c/o rash due poison ivy x 2 days. Currently on both arms and a few spots on legs. [place] prn

## 2023-04-10 NOTE — Discharge Instructions (Addendum)
Instructed patient to take medication as directed with food to completion.  Advised patient to change bed linens for the next 2 days to prevent recontamination.  Encouraged increase daily water intake to 64 ounces per day while taking this medication.  Advised if symptoms worsen and/or unresolved please follow-up with PCP or here for further evaluation.

## 2023-04-10 NOTE — ED Provider Notes (Signed)
[facility]    CSN: [id number] Arrival date & time: 04/10/23  4098      History   Chief Complaint Chief Complaint  Patient presents with   Poison Ivy    Rash, APPT 815AM    HPI Wanda Moore is a 62 y.o. female.   HPI Pe[place] 62 year old female presents with poison ivy rash for 2 days.  Reports currently on arms and legs.  PMH significant for atrial fibrillation, obesity, breast cancer, recurrent UTI, and melanoma.  Patient is currently on apixaban and denies any unusual bleeding.  Past Medical History:  Diagnosis Date   Allergy    Anxiety    Barrett esophagus 2008   Breast cancer [facility])    Breast cancer of lower-outer quadrant of left female breast (HCC) 03/15/2015   Cough 08/03/2012   nonproductive   Distal radius fracture, left 12/05/2012   Diverticulosis of colon (without mention of hemorrhage)    Esophageal reflux    Hiatal hernia 2013   large   Melanoma (HCC)    removed from betwen shoulder blades   Personal history of radiation therapy 2016   Recurrent UTI    Stricture and stenosis of esophagus 2013    Patient Active Problem List   Diagnosis Date Noted   Mitral regurgitation 03/09/2022   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   Persistent atrial fibrillation (HCC) 10/07/2021   Rectal bleeding 04/21/2016   Family history of malignant neoplasm of breast 03/18/2015   Melanoma of skin (HCC) 03/18/2015   Breast cancer of lower-outer quadrant of left female breast (HCC) 03/15/2015   Abdominal pain, epigastric 04/16/2014   History of Nissen fundoplication 04/16/2014   Routine health maintenance 05/04/2013   Distal radius fracture, left 12/05/2012   NECK PAIN 01/05/2010   ESOPHAGEAL STRICTURE 04/20/2008   BARRETTS ESOPHAGUS 04/20/2008   ALLERGIC RHINITIS 12/13/2007   GERD 12/13/2007   DIVERTICULOSIS, COLON 12/13/2007   MELANOMA, TRUNK, HX OF 12/13/2007   CHOLECYSTECTOMY, HX OF 07/16/2007    Past Surgical History:   Procedure Laterality Date   ATRIAL FIBRILLATION ABLATION N/A 07/25/2022   Procedure: ATRIAL FIBRILLATION ABLATION;  Surgeon: Lanier Prude, MD;  [facility];  Service: Cardiovascular;  Laterality: N/A;   BREAST LUMPECTOMY Left 2016   BREAST LUMPECTOMY WITH RADIOACTIVE SEED AND SENTINEL LYMPH NODE BIOPSY Left 03/25/2015   Procedure: LEFT BREAST LUMPECTOMY WITH RADIOACTIVE SEED AND LEFT AXILLARY SENTINEL LYMPH NODE BIOPSY;  Surgeon: Ovidio Kin, MD;  Location: [facility];  Service: General;  Laterality: Left;   CARDIOVERSION N/A 10/28/2021   Procedure: CARDIOVERSION;  Surgeon: Christell Constant, MD;  Location: [facility];  Service: Cardiovascular;  Laterality: N/A;   CESAREAN SECTION  1992   CHOLECYSTECTOMY     COLONOSCOPY  03/25/2007   Diverticulosis    ESOPHAGOGASTRODUODENOSCOPY  04/10/2012   HH, and Stricture   LAPAROSCOPIC NISSEN FUNDOPLICATION  08/09/2012   Procedure: LAPAROSCOPIC NISSEN FUNDOPLICATION;  Surgeon: Valarie Merino, MD;  Location: [facility];  Service: General;  Laterality: N/A;  HIATAL HERNIA REPAIR   MELANOMA EXCISION     back   OPEN REDUCTION INTERNAL FIXATION (ORIF) DISTAL RADIAL FRACTURE  12/05/2012   Procedure: OPEN REDUCTION INTERNAL FIXATION (ORIF) DISTAL RADIAL FRACTURE;  Surgeon: Eulas Post, MD;  Location: [facility];  Service: Orthopedics;  Laterality: Left;    OB History   No obstetric history on file.      Home Medications    Prior to Admission medications  Medication Sig Start Date End Date Taking? Authorizing Provider  predniSONE (STERAPRED UNI-PAK 21 TAB) 10 MG (21) TBPK tablet Take by mouth daily. Take 6 tabs by mouth daily  for 2 days, then 5 tabs for 2 days, then 4 tabs for 2 days, then 3 tabs for 2 days, 2 tabs for 2 days, then 1 tab by mouth daily for 2 days 04/10/23  Yes Trevor Iha, FNP  apixaban (ELIQUIS) 5 MG TABS tablet Take 1 tablet (5 mg total) by mouth 2 (two) times daily.  02/22/23   Lanier Prude, MD  Calcium Carbonate-Vitamin D (CALCIUM-VITAMIN D) 500-200 MG-UNIT per tablet Take 1 tablet by mouth 2 (two) times daily.    [provider]  empagliflozin (JARDIANCE) 10 MG TABS tablet Take 1 tablet (10 mg total) by mouth daily before breakfast. 03/16/23   Jake Bathe, MD  furosemide (LASIX) 20 MG tablet TAKE 1 TABLET (20 MG TOTAL) BY MOUTH DAILY AS NEEDED (SWELLING/WT GAIN). 07/20/22   Fenton, Clint R, PA  KLOR-CON M10 10 MEQ tablet TAKE 1 TABLET (10 MEQ TOTAL) BY MOUTH DAILY AS NEEDED (WHEN LASIX IS TAKEN). 07/20/22   Fenton, Clint R, PA  metoprolol succinate (TOPROL-XL) 50 MG 24 hr tablet TAKE 1.5 TABLETS BY MOUTH 2 TIMES DAILY. 03/15/23   Lanier Prude, MD  pantoprazole (PROTONIX) 40 MG tablet Take 1 tablet (40 mg total) by mouth 2 (two) times daily before a meal. 05/22/22   Lemmon, Violet Baldy, PA  VITAMIN D PO Take 5,000 Units by mouth daily.    [provider]    Family History Family History  Problem Relation Age of Onset   Hypertension Mother    Diabetes Mother    Heart disease Mother    Lung cancer Father    Other Father        father was adopted - no paternal family history information   Breast cancer Sister    Cancer Sister 80       breast (half sister - same mother)   Diabetes Maternal Grandmother    Colon cancer Neg Hx    Esophageal cancer Neg Hx    Stomach cancer Neg Hx     Social History Social History   Tobacco Use   Smoking status: Never   Smokeless tobacco: Never   Tobacco comments:    Never smoke 02/15/22  Vaping Use   Vaping Use: Never used  Substance Use Topics   Alcohol use: Yes    Alcohol/week: 2.0 - 3.0 standard drinks of alcohol    Types: 2 - 3 Standard drinks or equivalent per week    Comment: weekends 4-6 10/07/21   Drug use: No     Allergies   Buprenex [buprenorphine] and Pseudoephedrine hcl   Review of Systems Review of Systems  Skin:  Positive for rash.  All other systems  reviewed and are negative.    Physical Exam Triage Vital Signs ED Triage Vitals  Enc Vitals Group     BP 04/10/23 0817 130/80     Pulse Rate 04/10/23 0817 65     Resp 04/10/23 0817 17     Temp 04/10/23 0817 98.2 F (36.8 C)     Temp Source 04/10/23 0817 Oral     SpO2 04/10/23 0817 98 %     Weight --      Height --      Head Circumference --      Peak Flow --      Pain  Score 04/10/23 0818 0     Pain Loc --      Pain Edu? --      Excl. in GC? --    No data found.  Updated Vital Signs BP 130/80 (BP Location: Right Arm)   Pulse 65   Temp 98.2 F (36.8 C) (Oral)   Resp 17   SpO2 98%      Physical Exam Vitals and nursing note reviewed.  Constitutional:      Appearance: Normal appearance. She is obese. She is not ill-appearing.  HENT:     Head: Normocephalic and atraumatic.     Mouth/Throat:     Mouth: Mucous membranes are moist.     Pharynx: Oropharynx is clear.  Eyes:     Extraocular Movements: Extraocular movements intact.     Conjunctiva/sclera: Conjunctivae normal.     Pupils: Pupils are equal, round, and reactive to light.  Cardiovascular:     Rate and Rhythm: Normal rate and regular rhythm.     Pulses: Normal pulses.     Heart sounds: Normal heart sounds.  Pulmonary:     Effort: Pulmonary effort is normal.     Breath sounds: Normal breath sounds. No wheezing, rhonchi or rales.  Musculoskeletal:        General: Normal range of motion.     Cervical back: Normal range of motion and neck supple. No tenderness.  Lymphadenopathy:     Cervical: No cervical adenopathy.  Skin:    General: Skin is warm and dry.     Comments: Bilateral lower arms (volar aspects)/right lower leg (medial aspect): Pruritic erythematous maculopapular eruption with grouped vesicular lesions-please see images below  Neurological:     General: No focal deficit present.     Mental Status: She is alert and oriented to person, place, and time. Mental status is at baseline.  Psychiatric:         Mood and Affect: Mood normal.        Behavior: Behavior normal.        Thought Content: Thought content normal.         UC Treatments / Results  Labs (all labs ordered are listed, but only abnormal results are displayed) Labs Reviewed - No data to display  EKG   Radiology No results found.  Procedures Procedures (including critical care time)  Medications Ordered in UC Medications  methylPREDNISolone sodium succinate (SOLU-MEDROL) 125 mg/2 mL injection 125 mg (125 mg Intramuscular Given 04/10/23 0841)    Initial Impression / Assessment and Plan / UC Course  I have reviewed the triage vital signs and the nursing notes.  Pertinent labs & imaging results that were available during my care of the patient were reviewed by me and considered in my medical decision making (see chart for details).     MDM: 1. Poison ivy dermatitis-IM Solu-Medrol 125 mg given once in clinic prior to discharge, Rx'd Sterapred Unipak (tapering from 60 mg to 10 mg over 10 days). Instructed patient to take medication as directed with food to completion.  Advised patient to change bed linens for the next 2 days to prevent recontamination.  Encouraged increase daily water intake to 64 ounces per day while taking this medication.  Advised if symptoms worsen and/or unresolved please follow-up with PCP or here for further evaluation.  Patient discharged home, hemodynamically stable. Final Clinical Impressions(s) / UC Diagnoses   Final diagnoses:  Poison ivy dermatitis     Discharge Instructions      Instructed  patient to take medication as directed with food to completion.  Advised patient to change bed linens for the next 2 days to prevent recontamination.  Encouraged increase daily water intake to 64 ounces per day while taking this medication.  Advised if symptoms worsen and/or unresolved please follow-up with PCP or here for further evaluation.     ED Prescriptions     Medication Sig  Dispense Auth. Provider   predniSONE (STERAPRED UNI-PAK 21 TAB) 10 MG (21) TBPK tablet Take by mouth daily. Take 6 tabs by mouth daily  for 2 days, then 5 tabs for 2 days, then 4 tabs for 2 days, then 3 tabs for 2 days, 2 tabs for 2 days, then 1 tab by mouth daily for 2 days 42 tablet Trevor Iha, FNP      PDMP not reviewed this encounter.   Trevor Iha, FNP 04/10/23 [phone]

## 2023-04-12 ENCOUNTER — Ambulatory Visit (HOSPITAL_COMMUNITY)
Admission: RE | Admit: 2023-04-12 | Discharge: 2023-04-12 | Disposition: A | Discharge status: Home / Self Care | Payer: BC Managed Care – PPO | Source: Ambulatory Visit | Attending: Physician Assistant | Admitting: Physician Assistant

## 2023-04-12 ENCOUNTER — Encounter (HOSPITAL_COMMUNITY): Payer: Self-pay | Admitting: Physician Assistant

## 2023-04-12 VITALS — BP 138/82 | HR 144 | Ht 72.0 in | Wt 252.0 lb

## 2023-04-12 DIAGNOSIS — Z79899 Other long term (current) drug therapy: Secondary | ICD-10-CM | POA: Insufficient documentation

## 2023-04-12 DIAGNOSIS — I4819 Other persistent atrial fibrillation: Secondary | ICD-10-CM | POA: Insufficient documentation

## 2023-04-12 DIAGNOSIS — I38 Endocarditis, valve unspecified: Secondary | ICD-10-CM | POA: Insufficient documentation

## 2023-04-12 DIAGNOSIS — I5022 Chronic systolic (congestive) heart failure: Secondary | ICD-10-CM | POA: Insufficient documentation

## 2023-04-12 DIAGNOSIS — Z7901 Long term (current) use of anticoagulants: Secondary | ICD-10-CM | POA: Insufficient documentation

## 2023-04-12 LAB — BASIC METABOLIC PANEL
Anion gap: 10 (ref 5–15)
BUN: 15 mg/dL (ref 8–23)
CO2: 22 mmol/L (ref 22–32)
Calcium: 8.9 mg/dL (ref 8.9–10.3)
Chloride: 106 mmol/L (ref 98–111)
Creatinine, Ser: 0.78 mg/dL (ref 0.44–1.00)
GFR, Estimated: >60 mL/min (ref >60)
Glucose, Bld: 117 mg/dL — ABNORMAL HIGH (ref 70–99)
Potassium: 3.9 mmol/L (ref 3.5–5.1)
Sodium: 138 mmol/L (ref 135–145)

## 2023-04-12 LAB — CBC
HCT: 36.9 % (ref 36.0–46.0)
Hemoglobin: 12.1 g/dL (ref 12.0–15.0)
MCH: 29.3 pg (ref 26.0–34.0)
MCHC: 32.8 g/dL (ref 30.0–36.0)
MCV: 89.3 fL (ref 80.0–100.0)
Platelets: 301 10*3/uL (ref 150–400)
RBC: 4.13 MIL/uL (ref 3.87–5.11)
RDW: 13.2 % (ref 11.5–15.5)
WBC: 10.5 10*3/uL (ref 4.0–10.5)
nRBC: 0 % (ref 0.0–0.2)

## 2023-04-12 NOTE — Patient Instructions (Signed)
Increase metoprolol to 100mg  twice a day until day of cardioversion then reduce to normal dosing   Cardioversion scheduled for: Tuesday, June 11th   - Arrive at the [facility] and go to admitting at 730am   - Do not eat or drink anything after midnight the night prior to your procedure.   - Take all your morning medication (except diabetic medications) with a sip of water prior to arrival.  - You will not be able to drive home after your procedure.    - Do NOT miss any doses of your blood thinner - if you should miss a dose please notify our office immediately.   - If you feel as if you go back into normal rhythm prior to scheduled cardioversion, please notify our office immediately.   If your procedure is canceled in the cardioversion suite you will be charged a cancellation fee.

## 2023-04-12 NOTE — Progress Notes (Signed)
Primary Care Physician: Melida Quitter, MD Primary Cardiologist: Dr Anne Fu Primary Electrophysiologist: Dr Lalla Brothers Referring Physician: Dr Luevenia Maxin Wanda Moore is a 62 y.o. female with a history of new onset atrial fibrillation who presents for follow up in the [facility]. The patient was initially diagnosed with atrial fibrillation 10/05/21 after presenting to her PCP with symptoms of SOB. ECG showed afib with rapid rates. She was started on metoprolol for rate control. Patient has a CHADS2VASC score of 1. She was sent by her PCP for a CT to rule out PE which was negative. It did show small pleural effusions and peribrochovascular ground glass in lower lobes, consistent with CHF. She reports that she had been fatigued for two months leading up to this diagnosis. She also reported orthopnea.   Patient is s/p DCCV on 10/28/21. Unfortunately, she was back in afib 11/02/21 with heart racing and fatigue. Her BB was increased. Echo showed EF 30-35%, moderate-severe MR. She was started on amiodarone with plan to recheck echo once in SR. Patient reports that she converted to SR 11/10/21, confirmed on her smart watch. Patient had an echo which showed improvement in her EF, up to 45-50%, MR improved in SR.  Patient is s/p afib ablation with Dr Lalla Brothers on 07/25/22. Her amiodarone was discontinued.   On follow up today, patient presented to the urgent care on 04/10/23 for contact dermatitis/poison ivy and was given a steroid IM injection and a steroid dose pack. This morning, she was back in afib with symptoms of tachypalpitations and lightheadedness. No bleeding issues on anticoagulation.    Today, she denies symptoms of chest pain, PND, lower extremity edema, orthopnea, presyncope, syncope, snoring, daytime somnolence, bleeding, or neurologic sequela. The patient is tolerating medications without difficulties and is otherwise without complaint today.    Atrial  Fibrillation Risk Factors:  she does not have symptoms or diagnosis of sleep apnea. she does not have a history of rheumatic fever. she does not have a history of alcohol use. The patient does not have a history of early familial atrial fibrillation or other arrhythmias.  she has a BMI of Body mass index is 34.18 kg/m.Marland Kitchen Filed Weights   04/12/23 1455  Weight: 114.3 kg   Family History  Problem Relation Age of Onset   Hypertension Mother    Diabetes Mother    Heart disease Mother    Lung cancer Father    Other Father        father was adopted - no paternal family history information   Breast cancer Sister    Cancer Sister 63       breast (half sister - same mother)   Diabetes Maternal Grandmother    Colon cancer Neg Hx    Esophageal cancer Neg Hx    Stomach cancer Neg Hx      Atrial Fibrillation Management history:  Previous antiarrhythmic drugs: amiodarone  Previous cardioversions: 10/28/21 Previous ablations: 07/25/22 Anticoagulation history: Eliquis   Past Medical History:  Diagnosis Date   Allergy    Anxiety    Barrett esophagus 2008   Breast cancer (HCC)    Breast cancer of lower-outer quadrant of left female breast (HCC) 03/15/2015   Cough 08/03/2012   nonproductive   Distal radius fracture, left 12/05/2012   Diverticulosis of colon (without mention of hemorrhage)    Esophageal reflux    Hiatal hernia 2013   large   Melanoma (HCC)    removed from betwen  shoulder blades   Personal history of radiation therapy 2016   Recurrent UTI    Stricture and stenosis of esophagus 2013   Past Surgical History:  Procedure Laterality Date   ATRIAL FIBRILLATION ABLATION N/A 07/25/2022   Procedure: ATRIAL FIBRILLATION ABLATION;  Surgeon: Lanier Prude, MD;  Location: [facility];  Service: Cardiovascular;  Laterality: N/A;   BREAST LUMPECTOMY Left 2016   BREAST LUMPECTOMY WITH RADIOACTIVE SEED AND SENTINEL LYMPH NODE BIOPSY Left 03/25/2015   Procedure: LEFT  BREAST LUMPECTOMY WITH RADIOACTIVE SEED AND LEFT AXILLARY SENTINEL LYMPH NODE BIOPSY;  Surgeon: Ovidio Kin, MD;  Location: [facility];  Service: General;  Laterality: Left;   CARDIOVERSION N/A 10/28/2021   Procedure: CARDIOVERSION;  Surgeon: Christell Constant, MD;  Location: [facility];  Service: Cardiovascular;  Laterality: N/A;   CESAREAN SECTION  1992   CHOLECYSTECTOMY     COLONOSCOPY  03/25/2007   Diverticulosis    ESOPHAGOGASTRODUODENOSCOPY  04/10/2012   HH, and Stricture   LAPAROSCOPIC NISSEN FUNDOPLICATION  08/09/2012   Procedure: LAPAROSCOPIC NISSEN FUNDOPLICATION;  Surgeon: Valarie Merino, MD;  Location: [facility];  Service: General;  Laterality: N/A;  HIATAL HERNIA REPAIR   MELANOMA EXCISION     back   OPEN REDUCTION INTERNAL FIXATION (ORIF) DISTAL RADIAL FRACTURE  12/05/2012   Procedure: OPEN REDUCTION INTERNAL FIXATION (ORIF) DISTAL RADIAL FRACTURE;  Surgeon: Eulas Post, MD;  Location: [facility];  Service: Orthopedics;  Laterality: Left;    Current Outpatient Medications  Medication Sig Dispense Refill   apixaban (ELIQUIS) 5 MG TABS tablet Take 1 tablet (5 mg total) by mouth 2 (two) times daily. 60 tablet 5   Calcium Carbonate-Vitamin D (CALCIUM-VITAMIN D) 500-200 MG-UNIT per tablet Take 1 tablet by mouth 2 (two) times daily.     furosemide (LASIX) 20 MG tablet TAKE 1 TABLET (20 MG TOTAL) BY MOUTH DAILY AS NEEDED (SWELLING/WT GAIN). 90 tablet 1   KLOR-CON M10 10 MEQ tablet TAKE 1 TABLET (10 MEQ TOTAL) BY MOUTH DAILY AS NEEDED (WHEN LASIX IS TAKEN). 90 tablet 1   metoprolol succinate (TOPROL-XL) 50 MG 24 hr tablet TAKE 1.5 TABLETS BY MOUTH 2 TIMES DAILY. 270 tablet 2   pantoprazole (PROTONIX) 40 MG tablet Take 1 tablet (40 mg total) by mouth 2 (two) times daily before a meal. 180 tablet 3   predniSONE (STERAPRED UNI-PAK 21 TAB) 10 MG (21) TBPK tablet Take by mouth daily. Take 6 tabs by mouth daily  for 2 days, then 5 tabs for 2 days, then  4 tabs for 2 days, then 3 tabs for 2 days, 2 tabs for 2 days, then 1 tab by mouth daily for 2 days 42 tablet 0   VITAMIN D PO Take 5,000 Units by mouth daily.     empagliflozin (JARDIANCE) 10 MG TABS tablet Take 1 tablet (10 mg total) by mouth daily before breakfast. (Patient not taking: Reported on 04/12/2023) 30 tablet 3   No current facility-administered medications for this encounter.    Allergies  Allergen Reactions   Buprenex [Buprenorphine] Itching   Pseudoephedrine Hcl Itching    Social History   Socioeconomic History   Marital status: Married    Spouse name: Not on file   Number of children: 2   Years of education: 14   Highest education level: Not on file  Occupational History   Occupation: adm assistance    Employer: [employer]  Tobacco Use   Smoking status: Never  Smokeless tobacco: Never   Tobacco comments:    Never smoke 02/15/22  Vaping Use   Vaping Use: Never used  Substance and Sexual Activity   Alcohol use: Yes    Alcohol/week: 2.0 - 3.0 standard drinks of alcohol    Types: 2 - 3 Standard drinks or equivalent per week    Comment: weekends 4-6 10/07/21   Drug use: No   Sexual activity: Yes  Other Topics Concern   Not on file  Social History Narrative   HSG, [employer] - 2 years. Married 1990. 2 dtrs - '92, '94. Marriage in good health.   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   Feels safe at home and denies abuse            Social Determinants of Health   Financial Resource Strain: Not on file  Food Insecurity: Not on file  Transportation Needs: Not on file  Physical Activity: Not on file  Stress: Not on file  Social Connections: Not on file  Intimate Partner Violence: Not on file     ROS- All systems are reviewed and negative except as per the HPI above.  Physical Exam: Vitals:   04/12/23 1455  BP: 138/82  Pulse: (!) 144  Weight: 114.3  kg  Height: 6' (1.829 m)    GEN- The patient is a well appearing female, alert and oriented x 3 today.   HEENT-head normocephalic, atraumatic, sclera clear, conjunctiva pink, hearing intact, trachea midline. Lungs- Clear to ausculation bilaterally, normal work of breathing Heart- irregular rate and rhythm, no murmurs, rubs or gallops  GI- soft, NT, ND, + BS Extremities- no clubbing, cyanosis, or edema MS- no significant deformity or atrophy Skin- erythematous eruptions on bilateral arms and legs.  Psych- euthymic mood, full affect Neuro- strength and sensation are intact   Wt Readings from Last 3 Encounters:  04/12/23 114.3 kg  03/16/23 112.5 kg  12/04/22 110.7 kg    EKG today demonstrates  Afib with RVR Vent. rate 144 BPM PR interval * ms QRS duration 74 ms QT/QTcB 312/483 ms  Echo 10/17/21 demonstrated  1. Left ventricular ejection fraction, by estimation, is 30 to 35%. The  left ventricle has moderately decreased function. The left ventricle  demonstrates global hypokinesis. The left ventricular internal cavity size was moderately dilated. Left ventricular diastolic parameters are indeterminate.   2. Right ventricular systolic function is mildly reduced. The right  ventricular size is normal. There is normal pulmonary artery systolic  pressure.   3. Mitral regurgitation is significant with jets directed both posterior  and centrally Would recomm TEE to further define mechanism, severity. Moderate to severe mitral valve regurgitation.   4. The aortic valve is tricuspid. Aortic valve regurgitation is not  visualized. Aortic valve sclerosis is present, with no evidence of aortic  valve stenosis.   5. Aortic dilatation noted. There is mild dilatation of the ascending  aorta, measuring 39 mm.   6. The inferior vena cava is dilated in size with <50% respiratory  variability, suggesting right atrial pressure of 15 mmHg.    Epic records are reviewed at length  today  CHA2DS2-VASc Score = 2  The patient's score is based upon: CHF History: 1 HTN History: 0 Diabetes History: 0 Stroke History: 0 Vascular Disease History: 0 Age Score: 0 Gender Score: 1        ASSESSMENT AND PLAN: 1. Persistent Atrial Fibrillation (ICD10:  I48.19) The patient's CHA2DS2-VASc score is 2, indicating a 2.2% annual risk of stroke.   S/p afib ablation 07/25/22, off amiodarone. Patient back in rapid afib, suspect related to recent steroid use.  We discussed rhythm control options today. Will plan for DCCV.  Check bmet/cbc today.  Continue Eliquis 5 mg BID, she denies any missed doses in the past 3 weeks.  Increase Toprol to 100 mg BID, decrease back to 75 mg BID post DCCV.  Smart watch for home monitoring.   2. Valvular heart disease  MR in SR mild to moderate.   3. HFrEF EF improved from 30-35% to 45-50% with SR. Appears euvolemic today. She has not started Jardiance yet, pending insurance approval.    Follow up in the AF clinic post DCCV.    Jorja Loa PA-C [facility] [address] [place], [place] [address] [phone] 04/12/2023 3:35 PM

## 2023-04-16 ENCOUNTER — Telehealth (HOSPITAL_COMMUNITY): Payer: Self-pay | Admitting: *Deleted

## 2023-04-16 NOTE — Telephone Encounter (Signed)
Pt converted back into normal rhythm over weekend after increasing metoprolol. DCCV canceled. Pt returning to normal dosing of metoprolol. Pt will call if issues arise.

## 2023-04-17 ENCOUNTER — Encounter (HOSPITAL_COMMUNITY): Admission: RE | Payer: Self-pay | Source: Home / Self Care

## 2023-04-17 ENCOUNTER — Ambulatory Visit (HOSPITAL_COMMUNITY)
Admission: RE | Admit: 2023-04-17 | Payer: BC Managed Care – PPO | Source: Home / Self Care | Admitting: Cardiovascular Disease

## 2023-04-17 SURGERY — CARDIOVERSION
Anesthesia: General

## 2023-04-25 DIAGNOSIS — L237 Allergic contact dermatitis due to plants, except food: Secondary | ICD-10-CM | POA: Diagnosis not present

## 2023-04-26 ENCOUNTER — Ambulatory Visit (HOSPITAL_COMMUNITY): Payer: BC Managed Care – PPO | Admitting: Physician Assistant

## 2023-06-24 ENCOUNTER — Other Ambulatory Visit: Payer: Self-pay | Admitting: Physician Assistant

## 2023-06-24 DIAGNOSIS — R1310 Dysphagia, unspecified: Secondary | ICD-10-CM

## 2023-07-04 ENCOUNTER — Other Ambulatory Visit: Payer: Self-pay | Admitting: Hematology and Oncology

## 2023-07-04 DIAGNOSIS — Z1231 Encounter for screening mammogram for malignant neoplasm of breast: Secondary | ICD-10-CM

## 2023-07-11 ENCOUNTER — Other Ambulatory Visit: Payer: Self-pay | Admitting: Internal Medicine

## 2023-07-11 ENCOUNTER — Ambulatory Visit: Payer: BC Managed Care – PPO

## 2023-07-11 ENCOUNTER — Ambulatory Visit
Admission: RE | Admit: 2023-07-11 | Discharge: 2023-07-11 | Disposition: A | Discharge status: Home / Self Care | Payer: BC Managed Care – PPO | Source: Ambulatory Visit | Attending: Internal Medicine | Admitting: Internal Medicine

## 2023-07-11 DIAGNOSIS — Z1231 Encounter for screening mammogram for malignant neoplasm of breast: Secondary | ICD-10-CM | POA: Diagnosis not present

## 2023-07-17 ENCOUNTER — Encounter: Payer: Self-pay | Admitting: Hematology and Oncology

## 2023-07-17 ENCOUNTER — Other Ambulatory Visit: Payer: Self-pay | Admitting: Physician Assistant

## 2023-07-17 DIAGNOSIS — R1310 Dysphagia, unspecified: Secondary | ICD-10-CM

## 2023-07-24 ENCOUNTER — Telehealth: Payer: Self-pay | Admitting: Physician Assistant

## 2023-07-24 DIAGNOSIS — R1310 Dysphagia, unspecified: Secondary | ICD-10-CM

## 2023-07-24 MED ORDER — PANTOPRAZOLE SODIUM 40 MG PO TBEC
40.0000 mg | DELAYED_RELEASE_TABLET | Freq: Two times a day (BID) | ORAL | 0 refills | Status: DC
Start: 2023-07-24 — End: 2023-10-24

## 2023-07-24 NOTE — Telephone Encounter (Signed)
Inbound call from patient requesting a refill for pantoprazole medication. Please advise, thank you.

## 2023-07-24 NOTE — Telephone Encounter (Signed)
Script sent to pharmacy.

## 2023-07-30 ENCOUNTER — Telehealth (HOSPITAL_COMMUNITY): Payer: Self-pay | Admitting: *Deleted

## 2023-07-30 NOTE — Telephone Encounter (Signed)
Patient was taking a shower this morning started feeling swimmy headed so she got out and laid down. Put her apple watch on a realized she was back in afib. HRs 130-160. BP 140/82. Feeling some better after laying down a few minutes.  Discussed with Jorja Loa PA will increase metoprolol to 100mg  BID until back in NSR as this has converted her in the past. Pt will call next 1-2 days if no response and will bring in for assessment. Pt in agreement.

## 2023-07-31 NOTE — Telephone Encounter (Signed)
Patient called to report she converted to normal rhythm after her evening dose of metoprolol. Feeling back to baseline. HRs 70-80s. She will return to normal dosing of metoprolol. Call if afib burden increases. Pt in agreement.

## 2023-08-14 ENCOUNTER — Ambulatory Visit (HOSPITAL_COMMUNITY): Payer: BC Managed Care – PPO | Admitting: Physician Assistant

## 2023-08-15 NOTE — Progress Notes (Unsigned)
Cardiology Office Note:  .   Date:  08/15/2023  ID:  Wanda Moore, DOB [birth year], MRN [id number] PCP: Wanda Quitter, MD  [facility] Providers Cardiologist:  Wanda Schultz, MD Electrophysiologist:  Wanda Prude, MD {  History of Present Illness: .   Wanda Moore is a 62 y.o. female w/PMHx of breast cancer (L, s/p lumpectomy, XRT)NICM (tachy-mediated), AFib,   She saw Dr. Lalla Moore 12/04/22, doing well post ablation without symptoms of her AF and her amiodarone stopped  She saw Dr. Anne Moore 03/16/23, doing well, DOE with stairs particularly, encouraged regular exercise, added Jardiance for HF management.  Saw the Afib clinic 04/12/23 s/p [facility] visit for poison ivy treated with IM and PO steroid >> recurrent Afib Toprol increased temporarily until she get DCCV, was pending insurance auth for Jardiance  Spontaneous CV back to SR and did not need DCCV >> back to baseline Toprol dosing  07/30/23> pt called with recurrent symptomatic Afib via AFib clinic advised to increase her Toprol again and f/u in a couple days >> converted by phone note and advised to return back to 75BID  Today's visit is scheduled as a 6 mo visit  ROS: ***  *** symptoms *** volume *** eliquis, dose, labs *** keep toprol dose up?  Arrhythmia/AAD hx Diagnosed Nov 2022 Amiodarone started Dec 2022 >> stopped Jan 2024 post ablatiopn PVI ablation 07/25/22  Studies Reviewed: Marland Kitchen    EKG not done today  Atrial Fibrillation Ablation 07/25/2022: CONCLUSIONS: 1. Successful PVI 2. Successful ablation/isolation of the posterior wall 3. Intracardiac echo reveals trivial pericardial effusion and normal left atrial architecture 4. No early apparent complications. 5.  Colchicine 0.6 mg by mouth twice daily for 5 days 6.  Protonix 40 mg by mouth once daily for 45 days   CT Cardiac & CA Score 07/18/2022: IMPRESSION: 1. There is normal pulmonary vein drainage into the left atrium with ostial  measurements above.   2. There is no thrombus in the left atrial appendage.   3. The esophagus runs in the left atrial midline and is not in proximity to any of the pulmonary vein ostia.   4. No PFO/ASD.   5. Normal coronary origin. Right dominance.   6. CAC score of 0 which is 0 percentile for age-, race-, and sex-matched controls.   7. Mild aortic atherosclerosis.   ECHO 02/09/22:  1. Left ventricular ejection fraction, by estimation, is 45 to 50%. The  left ventricle has mildly decreased function. The left ventricle  demonstrates global hypokinesis. The left ventricular internal cavity size  was mildly dilated. There is mild  asymmetric left ventricular hypertrophy of the basal-septal segment. Left  ventricular diastolic parameters are indeterminate.   2. Right ventricular systolic function is normal. The right ventricular  size is mildly enlarged. Tricuspid regurgitation signal is inadequate for  assessing PA pressure.   3. The mitral valve is grossly normal. Mild to moderate mitral valve  regurgitation. No evidence of mitral stenosis.   4. The aortic valve is tricuspid. Aortic valve regurgitation is mild. No  aortic stenosis is present.   5. The inferior vena cava is normal in size with greater than 50%  respiratory variability, suggesting right atrial pressure of 3 mmHg.   Comparison(s): Changes from prior study are noted. EF has improved to  45-50%. Mild to moderate MR.    Risk Assessment/Calculations:    Physical Exam:   VS:  There were no vitals taken for this visit.   Wt  Readings from Last 3 Encounters:  04/12/23 252 lb (114.3 kg)  03/16/23 248 lb (112.5 kg)  12/04/22 244 lb (110.7 kg)    GEN: Well nourished, well developed in no acute distress NECK: No JVD; No carotid bruits CARDIAC: ***RRR, no murmurs, rubs, gallops RESPIRATORY:  *** CTA b/l without rales, wheezing or rhonchi  ABDOMEN: Soft, non-tender, non-distended EXTREMITIES:  No edema; No deformity    PPM/ICD/ILR site: *** is stable, no thinning, fluctuation, tethering  ASSESSMENT AND PLAN: .    persistent AFib CHA2DS2Vasc is 2 (gender, CM), on Eliquis, *** appropriately dosed *** burden ***  NICM Felt tachy-mediated Improved by echo 2023 45-50% *** C/w Dr. Lalla Moore  Secondary hypercoagulable state 2/2 AFib     {Are you ordering a CV Procedure (e.g. stress test, cath, DCCV, TEE, etc)?   Press F2        :[id number]}     Dispo: ***  Signed, Wanda Pigeon, PA-C

## 2023-08-16 ENCOUNTER — Ambulatory Visit: Payer: BC Managed Care – PPO | Attending: Physician Assistant | Admitting: Physician Assistant

## 2023-08-16 ENCOUNTER — Encounter: Payer: Self-pay | Admitting: Physician Assistant

## 2023-08-16 VITALS — BP 116/80 | HR 60 | Ht 72.0 in | Wt 240.0 lb

## 2023-08-16 DIAGNOSIS — D6869 Other thrombophilia: Secondary | ICD-10-CM | POA: Diagnosis not present

## 2023-08-16 DIAGNOSIS — I428 Other cardiomyopathies: Secondary | ICD-10-CM

## 2023-08-16 DIAGNOSIS — I4819 Other persistent atrial fibrillation: Secondary | ICD-10-CM

## 2023-08-16 MED ORDER — METOPROLOL SUCCINATE ER 100 MG PO TB24: 100.0000 mg | ORAL_TABLET | Freq: Two times a day (BID) | ORAL | 2 refills | Status: DC

## 2023-08-16 NOTE — Patient Instructions (Addendum)
Medication Instructions:   START TAKING: METOPROLOL 100 MG TWICE  A DAY   *If you need a refill on your cardiac medications before your next appointment, please call your pharmacy*   Lab Work: NONE ORDERED  TODAY   If you have labs (blood work) drawn today and your tests are completely normal, you will receive your results only by: MyChart Message (if you have MyChart) OR A paper copy in the mail If you have any lab test that is abnormal or we need to change your treatment, we will call you to review the results.   Testing/Procedures: NONE ORDERED  TODAY    Follow-Up: At [facility], you and your health needs are our priority.  As part of our continuing mission to provide you with exceptional heart care, we have created designated Provider Care Teams.  These Care Teams include your primary Cardiologist (physician) and Advanced Practice Providers (APPs -  Physician Assistants and Nurse Practitioners) who all work together to provide you with the care you need, when you need it.  We recommend signing up for the patient portal called "MyChart".  Sign up information is provided on this After Visit Summary.  MyChart is used to connect with patients for Virtual Visits (Telemedicine).  Patients are able to view lab/test results, encounter notes, upcoming appointments, etc.  Non-urgent messages can be sent to your provider as well.   To learn more about what you can do with MyChart, go to [email or web address].    Your next appointment:   3 month(s)  ( CONTACT  CASSIE HALL/ ANGELINE HAMMER FOR EP SCHEDULING ISSUES )   Provider:   You may see Will Jorja Loa, MD or one of the following Advanced Practice Providers on your designated Care Team:   Francis Dowse, [place]  Other Instructions

## 2023-08-20 ENCOUNTER — Ambulatory Visit (HOSPITAL_COMMUNITY)
Admission: RE | Admit: 2023-08-20 | Discharge: 2023-08-20 | Disposition: A | Discharge status: Home / Self Care | Payer: BC Managed Care – PPO | Source: Ambulatory Visit | Attending: Physician Assistant | Admitting: Physician Assistant

## 2023-08-20 ENCOUNTER — Telehealth (HOSPITAL_COMMUNITY): Payer: Self-pay

## 2023-08-20 ENCOUNTER — Encounter (HOSPITAL_COMMUNITY): Payer: Self-pay | Admitting: Physician Assistant

## 2023-08-20 VITALS — BP 94/70 | HR 133 | Ht 72.0 in | Wt 240.0 lb

## 2023-08-20 DIAGNOSIS — I5020 Unspecified systolic (congestive) heart failure: Secondary | ICD-10-CM | POA: Insufficient documentation

## 2023-08-20 DIAGNOSIS — I4819 Other persistent atrial fibrillation: Secondary | ICD-10-CM | POA: Diagnosis not present

## 2023-08-20 DIAGNOSIS — I4891 Unspecified atrial fibrillation: Secondary | ICD-10-CM | POA: Diagnosis not present

## 2023-08-20 DIAGNOSIS — R0602 Shortness of breath: Secondary | ICD-10-CM | POA: Diagnosis not present

## 2023-08-20 DIAGNOSIS — Z7901 Long term (current) use of anticoagulants: Secondary | ICD-10-CM | POA: Insufficient documentation

## 2023-08-20 DIAGNOSIS — Z79899 Other long term (current) drug therapy: Secondary | ICD-10-CM | POA: Diagnosis not present

## 2023-08-20 DIAGNOSIS — I340 Nonrheumatic mitral (valve) insufficiency: Secondary | ICD-10-CM | POA: Diagnosis not present

## 2023-08-20 MED ORDER — AMIODARONE HCL 200 MG PO TABS: 200.0000 mg | ORAL_TABLET | Freq: Two times a day (BID) | ORAL | Status: DC

## 2023-08-20 NOTE — Telephone Encounter (Signed)
Patient called in regarding her A-fib. She went back in during the night. HR ranging from 130's-140's and B/P 90/65. Symptom: she feels woozy. Instructed patient to continue taking her Metoprolol medication 100mg  BID. She will continue to monitor her blood pressures and heart rates. Per Clint Fenton-PA advise patient to go to the ER if she has further concerns. Scheduled patient appointment tomorrow at 11:00am. Consulted with patient and she verbalized understanding.

## 2023-08-20 NOTE — Progress Notes (Signed)
Primary Care Physician: Melida Quitter, MD Primary Cardiologist: Dr Anne Fu Primary Electrophysiologist: Dr Lalla Brothers Referring Physician: Dr Luevenia Maxin Wanda Moore is a 62 y.o. female with a history of new onset atrial fibrillation who presents for follow up in the [facility]. The patient was initially diagnosed with atrial fibrillation 10/05/21 after presenting to her PCP with symptoms of SOB. ECG showed afib with rapid rates. She was started on metoprolol for rate control. Patient has a CHADS2VASC score of 1. She was sent by her PCP for a CT to rule out PE which was negative. It did show small pleural effusions and peribrochovascular ground glass in lower lobes, consistent with CHF. She reports that she had been fatigued for two months leading up to this diagnosis. She also reported orthopnea.   Patient is s/p DCCV on 10/28/21. Unfortunately, she was back in afib 11/02/21 with heart racing and fatigue. Her BB was increased. Echo showed EF 30-35%, moderate-severe MR. She was started on amiodarone with plan to recheck echo once in SR. Patient reports that she converted to SR 11/10/21, confirmed on her smart watch. Patient had an echo which showed improvement in her EF, up to 45-50%, MR improved in SR.  Patient is s/p afib ablation with Dr Lalla Brothers on 07/25/22. Her amiodarone was discontinued.   On follow up today, patient has now had several episodes of symptomatic paroxysmal afib over the past several weeks. This current episode started at 3 AM. She has symptoms of tachypalpitations. There were no specific triggers that she can identify.   Today, she denies symptoms of chest pain, PND, lower extremity edema, orthopnea, presyncope, syncope, snoring, daytime somnolence, bleeding, or neurologic sequela. The patient is tolerating medications without difficulties and is otherwise without complaint today.    Atrial Fibrillation Risk Factors:  she does not have symptoms  or diagnosis of sleep apnea. she does not have a history of rheumatic fever. she does not have a history of alcohol use. The patient does not have a history of early familial atrial fibrillation or other arrhythmias.   Atrial Fibrillation Management history:  Previous antiarrhythmic drugs: amiodarone  Previous cardioversions: 10/28/21 Previous ablations: 07/25/22 Anticoagulation history: Eliquis   Past Medical History:  Diagnosis Date   Allergy    Anxiety    Barrett esophagus 2008   Breast cancer (HCC)    Breast cancer of lower-outer quadrant of left female breast (HCC) 03/15/2015   Cough 08/03/2012   nonproductive   Distal radius fracture, left 12/05/2012   Diverticulosis of colon (without mention of hemorrhage)    Esophageal reflux    Hiatal hernia 2013   large   Melanoma (HCC)    removed from betwen shoulder blades   Personal history of radiation therapy 2016   Recurrent UTI    Stricture and stenosis of esophagus 2013    Current Outpatient Medications  Medication Sig Dispense Refill   acetaminophen (TYLENOL) 500 MG tablet Take 1,000 mg by mouth every 6 (six) hours as needed for moderate pain or mild pain.     apixaban (ELIQUIS) 5 MG TABS tablet Take 1 tablet (5 mg total) by mouth 2 (two) times daily. 60 tablet 5   b complex vitamins capsule Take 1 capsule by mouth daily.     Calcium Carbonate-Vitamin D (CALCIUM-VITAMIN D) 500-200 MG-UNIT per tablet Take 1 tablet by mouth 2 (two) times daily.     empagliflozin (JARDIANCE) 10 MG TABS tablet Take 1 tablet (10 mg total)  by mouth daily before breakfast. 30 tablet 3   furosemide (LASIX) 20 MG tablet TAKE 1 TABLET (20 MG TOTAL) BY MOUTH DAILY AS NEEDED (SWELLING/WT GAIN). 90 tablet 1   KLOR-CON M10 10 MEQ tablet TAKE 1 TABLET (10 MEQ TOTAL) BY MOUTH DAILY AS NEEDED (WHEN LASIX IS TAKEN). 90 tablet 1   metoprolol succinate (TOPROL-XL) 100 MG 24 hr tablet Take 1 tablet (100 mg total) by mouth 2 (two) times daily. Take with or  immediately following a meal. 180 tablet 2   pantoprazole (PROTONIX) 40 MG tablet Take 1 tablet (40 mg total) by mouth 2 (two) times daily before a meal. 180 tablet 0   predniSONE (STERAPRED UNI-PAK 21 TAB) 10 MG (21) TBPK tablet Take by mouth daily. Take 6 tabs by mouth daily  for 2 days, then 5 tabs for 2 days, then 4 tabs for 2 days, then 3 tabs for 2 days, 2 tabs for 2 days, then 1 tab by mouth daily for 2 days (Patient taking differently: Take 5 mg by mouth daily. Take 6 tabs by mouth daily  for 2 days, then 5 tabs for 2 days, then 4 tabs for 2 days, then 3 tabs for 2 days, 2 tabs for 2 days, then 1 tab by mouth daily for 2 days) 42 tablet 0   VITAMIN D PO Take 5,000 Units by mouth daily.     No current facility-administered medications for this encounter.    ROS- All systems are reviewed and negative except as per the HPI above.  Physical Exam: There were no vitals filed for this visit.   GEN: Well nourished, well developed in no acute distress NECK: No JVD; No carotid bruits CARDIAC: Irregularly irregular rate and rhythm, no murmurs, rubs, gallops RESPIRATORY:  Clear to auscultation without rales, wheezing or rhonchi  ABDOMEN: Soft, non-tender, non-distended EXTREMITIES:  No edema; No deformity    Wt Readings from Last 3 Encounters:  08/16/23 108.9 kg  04/12/23 114.3 kg  03/16/23 112.5 kg    EKG today demonstrates  Afib with RVR Vent. rate 133 BPM PR interval * ms QRS duration 80 ms QT/QTcB 302/449 ms  Echo 10/17/21 demonstrated  1. Left ventricular ejection fraction, by estimation, is 30 to 35%. The  left ventricle has moderately decreased function. The left ventricle  demonstrates global hypokinesis. The left ventricular internal cavity size was moderately dilated. Left ventricular diastolic parameters are indeterminate.   2. Right ventricular systolic function is mildly reduced. The right  ventricular size is normal. There is normal pulmonary artery systolic   pressure.   3. Mitral regurgitation is significant with jets directed both posterior  and centrally Would recomm TEE to further define mechanism, severity. Moderate to severe mitral valve regurgitation.   4. The aortic valve is tricuspid. Aortic valve regurgitation is not  visualized. Aortic valve sclerosis is present, with no evidence of aortic  valve stenosis.   5. Aortic dilatation noted. There is mild dilatation of the ascending  aorta, measuring 39 mm.   6. The inferior vena cava is dilated in size with <50% respiratory  variability, suggesting right atrial pressure of 15 mmHg.    Epic records are reviewed at length today  CHA2DS2-VASc Score = 2  The patient's score is based upon: CHF History: 1 HTN History: 0 Diabetes History: 0 Stroke History: 0 Vascular Disease History: 0 Age Score: 0 Gender Score: 1        ASSESSMENT AND PLAN: Persistent Atrial Fibrillation (ICD10:  I48.19) The  patient's CHA2DS2-VASc score is 2, indicating a 2.2% annual risk of stroke.   S/p afib ablation 07/25/22 Patient agreeable to repeat ablation per Francis Dowse note. We discussed rhythm control options. She previously tolerated amiodarone as a bridge to her first ablation. Will resume amiodarone 200 mg BID. Return for follow up in two weeks.  Continue Eliquis 5 mg BID Continue Toprol 100 mg BID Smart watch for home monitoring.   Valvular heart disease  MR in SR mild to moderate.   HFrEF EF improved from 30-35% to 45-50% with SR. Fluid status appears stable   Follow up in the AF clinic in 2 weeks.    Jorja Loa PA-C Afib Clinic [facility] [address] [place], [place] [address] [phone] 08/20/2023 3:36 PM

## 2023-08-20 NOTE — Patient Instructions (Signed)
Start amiodarone 200 mg twice daily

## 2023-08-21 ENCOUNTER — Telehealth: Payer: Self-pay

## 2023-08-21 ENCOUNTER — Ambulatory Visit (HOSPITAL_COMMUNITY): Payer: BC Managed Care – PPO | Admitting: Physician Assistant

## 2023-08-21 DIAGNOSIS — I4819 Other persistent atrial fibrillation: Secondary | ICD-10-CM

## 2023-08-21 NOTE — Telephone Encounter (Signed)
Spoke with the patient and scheduled her for an ablation on 1/27.  She is scheduled to see Dr. Lalla Brothers on 12/17 to discuss the procedure.

## 2023-08-21 NOTE — Telephone Encounter (Signed)
-----   Message from Lanier Prude sent at 08/18/2023  8:53 PM EDT ----- Yes. Recommend redo catheter ablation. You can pencil her in for a date and have her see me between now and ablation day. Carly, can you help facilitate? 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  I rec repeat ablation, she is agreeable if you are!

## 2023-08-21 NOTE — Addendum Note (Signed)
Addended by: Frutoso Schatz on: 08/21/2023 11:03 AM   Modules accepted: Orders

## 2023-08-21 NOTE — Addendum Note (Signed)
Addended by: Frutoso Schatz on: 08/21/2023 10:07 AM   Modules accepted: Orders

## 2023-08-26 ENCOUNTER — Other Ambulatory Visit: Payer: Self-pay | Admitting: Cardiology

## 2023-08-26 DIAGNOSIS — I4819 Other persistent atrial fibrillation: Secondary | ICD-10-CM

## 2023-08-27 NOTE — Telephone Encounter (Signed)
Prescription refill request for Eliquis received. Indication:afib Last office visit:10/24 Scr:0.78  6/24 Age: 62 Weight:108.9  kg  Prescription refilled

## 2023-09-03 ENCOUNTER — Ambulatory Visit (HOSPITAL_COMMUNITY)
Admission: RE | Admit: 2023-09-03 | Discharge: 2023-09-03 | Disposition: A | Discharge status: Home / Self Care | Payer: BC Managed Care – PPO | Source: Ambulatory Visit | Attending: Physician Assistant | Admitting: Physician Assistant

## 2023-09-03 ENCOUNTER — Encounter (HOSPITAL_COMMUNITY): Payer: Self-pay | Admitting: Physician Assistant

## 2023-09-03 VITALS — BP 112/84 | HR 114 | Ht 72.0 in | Wt 239.6 lb

## 2023-09-03 DIAGNOSIS — I4819 Other persistent atrial fibrillation: Secondary | ICD-10-CM | POA: Insufficient documentation

## 2023-09-03 DIAGNOSIS — I5020 Unspecified systolic (congestive) heart failure: Secondary | ICD-10-CM | POA: Insufficient documentation

## 2023-09-03 DIAGNOSIS — Z7901 Long term (current) use of anticoagulants: Secondary | ICD-10-CM | POA: Insufficient documentation

## 2023-09-03 DIAGNOSIS — Z79899 Other long term (current) drug therapy: Secondary | ICD-10-CM | POA: Insufficient documentation

## 2023-09-03 DIAGNOSIS — I77810 Thoracic aortic ectasia: Secondary | ICD-10-CM | POA: Diagnosis not present

## 2023-09-03 DIAGNOSIS — Z5181 Encounter for therapeutic drug level monitoring: Secondary | ICD-10-CM | POA: Diagnosis not present

## 2023-09-03 DIAGNOSIS — Z853 Personal history of malignant neoplasm of breast: Secondary | ICD-10-CM | POA: Diagnosis not present

## 2023-09-03 DIAGNOSIS — I340 Nonrheumatic mitral (valve) insufficiency: Secondary | ICD-10-CM | POA: Insufficient documentation

## 2023-09-03 LAB — BASIC METABOLIC PANEL
Anion gap: 6 (ref 5–15)
BUN: 11 mg/dL (ref 8–23)
CO2: 25 mmol/L (ref 22–32)
Calcium: 9 mg/dL (ref 8.9–10.3)
Chloride: 109 mmol/L (ref 98–111)
Creatinine, Ser: 0.52 mg/dL (ref 0.44–1.00)
GFR, Estimated: >60 mL/min (ref >60)
Glucose, Bld: 102 mg/dL — ABNORMAL HIGH (ref 70–99)
Potassium: 4.1 mmol/L (ref 3.5–5.1)
Sodium: 140 mmol/L (ref 135–145)

## 2023-09-03 LAB — CBC
HCT: 38.9 % (ref 36.0–46.0)
Hemoglobin: 12.5 g/dL (ref 12.0–15.0)
MCH: 26.9 pg (ref 26.0–34.0)
MCHC: 32.1 g/dL (ref 30.0–36.0)
MCV: 83.8 fL (ref 80.0–100.0)
Platelets: 243 10*3/uL (ref 150–400)
RBC: 4.64 MIL/uL (ref 3.87–5.11)
RDW: 12.6 % (ref 11.5–15.5)
WBC: 6 10*3/uL (ref 4.0–10.5)
nRBC: 0 % (ref 0.0–0.2)

## 2023-09-03 MED ORDER — AMIODARONE HCL 200 MG PO TABS: 200.0000 mg | ORAL_TABLET | Freq: Every day | ORAL | 3 refills | Status: DC

## 2023-09-03 NOTE — Progress Notes (Signed)
Primary Care Physician: Melida Quitter, MD Primary Cardiologist: Dr Anne Fu Primary Electrophysiologist: Dr Lalla Brothers Referring Physician: Dr Luevenia Maxin Wanda Moore is a 62 y.o. female with a history of new onset atrial fibrillation who presents for follow up in the [facility]. The patient was initially diagnosed with atrial fibrillation 10/05/21 after presenting to her PCP with symptoms of SOB. ECG showed afib with rapid rates. She was started on metoprolol for rate control. Patient has a CHADS2VASC score of 1. She was sent by her PCP for a CT to rule out PE which was negative. It did show small pleural effusions and peribrochovascular ground glass in lower lobes, consistent with CHF. She reports that she had been fatigued for two months leading up to this diagnosis. She also reported orthopnea.   Patient is s/p DCCV on 10/28/21. Unfortunately, she was back in afib 11/02/21 with heart racing and fatigue. Her BB was increased. Echo showed EF 30-35%, moderate-severe MR. She was started on amiodarone with plan to recheck echo once in SR. Patient reports that she converted to SR 11/10/21, confirmed on her smart watch. Patient had an echo which showed improvement in her EF, up to 45-50%, MR improved in SR.  Patient is s/p afib ablation with Dr Lalla Brothers on 07/25/22. Her amiodarone was discontinued.   Patient has now had several episodes of symptomatic paroxysmal afib over the past several weeks. Her amiodarone was resumed as a bridge to ablation.  On follow up today, patient reports that she had been in SR for several days but reverted to afib earlier this AM. There were no specific triggers that she could identify. She feels poorly in afib with SOB, heart racing, and intermittent dizziness.   Today, she denies symptoms of chest pain, PND, lower extremity edema, orthopnea, presyncope, syncope, snoring, daytime somnolence, bleeding, or neurologic sequela. The patient is  tolerating medications without difficulties and is otherwise without complaint today.    Atrial Fibrillation Risk Factors:  she does not have symptoms or diagnosis of sleep apnea. she does not have a history of rheumatic fever. she does not have a history of alcohol use. The patient does not have a history of early familial atrial fibrillation or other arrhythmias.   Atrial Fibrillation Management history:  Previous antiarrhythmic drugs: amiodarone  Previous cardioversions: 10/28/21 Previous ablations: 07/25/22 Anticoagulation history: Eliquis   Past Medical History:  Diagnosis Date   Allergy    Anxiety    Barrett esophagus 2008   Breast cancer (HCC)    Breast cancer of lower-outer quadrant of left female breast (HCC) 03/15/2015   Cough 08/03/2012   nonproductive   Distal radius fracture, left 12/05/2012   Diverticulosis of colon (without mention of hemorrhage)    Esophageal reflux    Hiatal hernia 2013   large   Melanoma (HCC)    removed from betwen shoulder blades   Personal history of radiation therapy 2016   Recurrent UTI    Stricture and stenosis of esophagus 2013    Current Outpatient Medications  Medication Sig Dispense Refill   acetaminophen (TYLENOL) 500 MG tablet Take 1,000 mg by mouth every 6 (six) hours as needed for moderate pain or mild pain.     amiodarone (PACERONE) 200 MG tablet Take 1 tablet (200 mg total) by mouth daily. 30 tablet 3   b complex vitamins capsule Take 1 capsule by mouth daily.     Calcium Carbonate-Vitamin D (CALCIUM-VITAMIN D) 500-200 MG-UNIT per tablet Take  1 tablet by mouth 2 (two) times daily.     ELIQUIS 5 MG TABS tablet TAKE 1 TABLET BY MOUTH TWICE A DAY 60 tablet 5   empagliflozin (JARDIANCE) 10 MG TABS tablet Take 1 tablet (10 mg total) by mouth daily before breakfast. 30 tablet 3   furosemide (LASIX) 20 MG tablet TAKE 1 TABLET (20 MG TOTAL) BY MOUTH DAILY AS NEEDED (SWELLING/WT GAIN). 90 tablet 1   KLOR-CON M10 10 MEQ tablet  TAKE 1 TABLET (10 MEQ TOTAL) BY MOUTH DAILY AS NEEDED (WHEN LASIX IS TAKEN). 90 tablet 1   metoprolol succinate (TOPROL-XL) 100 MG 24 hr tablet Take 1 tablet (100 mg total) by mouth 2 (two) times daily. Take with or immediately following a meal. 180 tablet 2   pantoprazole (PROTONIX) 40 MG tablet Take 1 tablet (40 mg total) by mouth 2 (two) times daily before a meal. 180 tablet 0   VITAMIN D PO Take 5,000 Units by mouth daily.     No current facility-administered medications for this encounter.    ROS- All systems are reviewed and negative except as per the HPI above.  Physical Exam: Vitals:   09/03/23 1426  BP: 112/84  Pulse: (!) 114  Weight: 108.7 kg  Height: 6' (1.829 m)    GEN: Well nourished, well developed in no acute distress NECK: No JVD; No carotid bruits CARDIAC: Irregularly irregular rate and rhythm, no murmurs, rubs, gallops RESPIRATORY:  Clear to auscultation without rales, wheezing or rhonchi  ABDOMEN: Soft, non-tender, non-distended EXTREMITIES:  No edema; No deformity    Wt Readings from Last 3 Encounters:  09/03/23 108.7 kg  08/20/23 108.9 kg  08/16/23 108.9 kg    EKG today demonstrates  Afib Vent. rate 114 BPM PR interval * ms QRS duration 80 ms QT/QTcB 350/482 ms   Echo 10/17/21 demonstrated  1. Left ventricular ejection fraction, by estimation, is 30 to 35%. The  left ventricle has moderately decreased function. The left ventricle  demonstrates global hypokinesis. The left ventricular internal cavity size was moderately dilated. Left ventricular diastolic parameters are indeterminate.   2. Right ventricular systolic function is mildly reduced. The right  ventricular size is normal. There is normal pulmonary artery systolic  pressure.   3. Mitral regurgitation is significant with jets directed both posterior  and centrally Would recomm TEE to further define mechanism, severity. Moderate to severe mitral valve regurgitation.   4. The aortic valve is  tricuspid. Aortic valve regurgitation is not  visualized. Aortic valve sclerosis is present, with no evidence of aortic  valve stenosis.   5. Aortic dilatation noted. There is mild dilatation of the ascending  aorta, measuring 39 mm.   6. The inferior vena cava is dilated in size with <50% respiratory  variability, suggesting right atrial pressure of 15 mmHg.    Epic records are reviewed at length today  CHA2DS2-VASc Score = 2  The patient's score is based upon: CHF History: 1 HTN History: 0 Diabetes History: 0 Stroke History: 0 Vascular Disease History: 0 Age Score: 0 Gender Score: 1        ASSESSMENT AND PLAN: Persistent Atrial Fibrillation (ICD10:  I48.19) The patient's CHA2DS2-VASc score is 2, indicating a 2.2% annual risk of stroke.   S/p afib ablation 07/25/22 Scheduled for visit with Dr Lalla Brothers 12/17 to discuss possible repeat ablation, tentatively scheduled for 12/03/23. Patient back in afib this AM. Hopefully, her afib burden will improve as she continues to load on amiodarone. If she is persistent,  will arrange for DCCV. Continue amiodarone 200 mg BID x 2 weeks then decrease to once daily Continue Eliquis 5 mg BID Continue Toprol 100 mg BID Smart watch for home monitoring.   Valvular heart disease  MR in SR mild to moderate.   HFrEF EF improved from 30-35% to 45-50% with SR. Fluid status appears stable today but she has been taking daily Lasix.  Check bmet today.  Check echo   Follow up with Jari Favre and Dr Lalla Brothers as scheduled.    Jorja Loa PA-C Afib Clinic [facility] [address] [place], [place] [address] [phone] 09/03/2023 3:49 PM

## 2023-09-03 NOTE — Patient Instructions (Signed)
On 11/11 decrease to Amiodarone 200 mg - 1 tablet daily

## 2023-09-11 ENCOUNTER — Ambulatory Visit (HOSPITAL_COMMUNITY)
Admission: RE | Admit: 2023-09-11 | Discharge: 2023-09-11 | Disposition: A | Discharge status: Home / Self Care | Payer: BC Managed Care – PPO | Source: Ambulatory Visit | Attending: Physician Assistant | Admitting: Physician Assistant

## 2023-09-11 DIAGNOSIS — I4819 Other persistent atrial fibrillation: Secondary | ICD-10-CM | POA: Diagnosis not present

## 2023-09-11 DIAGNOSIS — Z853 Personal history of malignant neoplasm of breast: Secondary | ICD-10-CM | POA: Insufficient documentation

## 2023-09-11 LAB — ECHOCARDIOGRAM COMPLETE
Area-P 1/2: 3.76 cm2
S' Lateral: 3.4 cm

## 2023-09-23 NOTE — Progress Notes (Unsigned)
Cardiology Office Note:  .   Date:  09/24/2023  ID:  Wanda Moore, DOB 1961-01-06, MRN [id number] PCP: Melida Quitter, MD  [facility] Providers Cardiologist:  Donato Schultz, MD Electrophysiologist:  Lanier Prude, MD {  History of Present Illness: .   Wanda Moore is a 62 y.o. female who presents for follow-up of atrial fibrillation status post ablation 07/2022.  Is followed by Dr. Lalla Brothers with prior tachycardia induced cardiomyopathy.   His atrial fibrillation was diagnosed 09/2021 after presenting with shortness of breath to primary care.  EKG showed atrial fibrillation with RVR.  Was placed on metoprolol with CHA2DS2-VASc score 2 (heart failure female).  CT scan was performed was negative for PE.  Underwent cardioversion 10/28/2021 and was back in atrial fibrillation a week later.  Beta-blocker was increased but her echo showed EF of 30% with moderate to severe MR.  While in sinus rhythm she felt great.  She was started on amiodarone periablation and then was taken off.  She did have repeat echo with LVEF 45 to 50% and her mitral regurgitation also improved.  She was on Eliquis and metoprolol succinate 75 mg twice a day.  She is still feeling short of breath when she saw the EP May 2024.  Activity included going up stairs or walking up hills and this is when she had the most shortness of breath.  She was prescribed some Jardiance.  Today, she presents with a history of atrial fibrillation (AFib),  with recurrent episodes of AFib characterized by shortness of breath and fatigue. The episodes have been occurring frequently, with seven episodes since September 23rd.  The duration of these episodes varies, ranging from two hours to three days. The patient reports feeling fatigued and lacking energy during these episodes, and it takes about four hours to recover after an episode. Despite being on amiodarone, metoprolol, and Eliquis, the patient continues to experience  these episodes. The patient also reports fluid retention, with swelling in the legs, even while on Lasix.  Reports no shortness of breath nor dyspnea on exertion. Reports no chest pain, pressure, or tightness. No edema, orthopnea, PND. Reports no palpitations.   Discussed the use of AI scribe software for clinical note transcription with the patient, who gave verbal consent to proceed.   ROS: Pertinent ROS in HPI  Studies Reviewed: Marland Kitchen        Atrial Fibrillation Ablation 07/25/2022: CONCLUSIONS: 1. Successful PVI 2. Successful ablation/isolation of the posterior wall 3. Intracardiac echo reveals trivial pericardial effusion and normal left atrial architecture 4. No early apparent complications. 5.  Colchicine 0.6 mg by mouth twice daily for 5 days 6.  Protonix 40 mg by mouth once daily for 45 days   CT Cardiac & CA Score 07/18/2022: IMPRESSION: 1. There is normal pulmonary vein drainage into the left atrium with ostial measurements above.   2. There is no thrombus in the left atrial appendage.   3. The esophagus runs in the left atrial midline and is not in proximity to any of the pulmonary vein ostia.   4. No PFO/ASD.   5. Normal coronary origin. Right dominance.   6. CAC score of 0 which is 0 percentile for age-, race-, and sex-matched controls.   7. Mild aortic atherosclerosis.   ECHO 02/09/22:  1. Left ventricular ejection fraction, by estimation, is 45 to 50%. The  left ventricle has mildly decreased function. The left ventricle  demonstrates global hypokinesis. The left ventricular internal cavity size  was mildly dilated. There is mild  asymmetric left ventricular hypertrophy of the basal-septal segment. Left  ventricular diastolic parameters are indeterminate.   2. Right ventricular systolic function is normal. The right ventricular  size is mildly enlarged. Tricuspid regurgitation signal is inadequate for  assessing PA pressure.   3. The mitral valve is grossly  normal. Mild to moderate mitral valve  regurgitation. No evidence of mitral stenosis.   4. The aortic valve is tricuspid. Aortic valve regurgitation is mild. No  aortic stenosis is present.   5. The inferior vena cava is normal in size with greater than 50%  respiratory variability, suggesting right atrial pressure of 3 mmHg.   Comparison(s): Changes from prior study are noted. EF has improved to  45-50%. Mild to moderate MR.       Physical Exam:   VS:  BP 120/72   Pulse 72   Ht 6' (1.829 m)   Wt 233 lb 12.8 oz (106.1 kg)   SpO2 95%   BMI 31.71 kg/m    Wt Readings from Last 3 Encounters:  09/24/23 233 lb 12.8 oz (106.1 kg)  09/03/23 239 lb 9.6 oz (108.7 kg)  08/20/23 240 lb (108.9 kg)    GEN: Well nourished, well developed in no acute distress NECK: No JVD; No carotid bruits CARDIAC: RRR, no murmurs, rubs, gallops RESPIRATORY:  Clear to auscultation without rales, wheezing or rhonchi  ABDOMEN: Soft, non-tender, non-distended EXTREMITIES:  No edema; No deformity   ASSESSMENT AND PLAN: .    Tachycardia induced cardiomyopathy -recent echo with normalized EF -continue current diuretic regimen with lasix and jardiance   Persistent Atrial Fibrillation Recurrent episodes of AFib with rapid ventricular response causing shortness of breath and fatigue. Episodes last from 2 hours to 3 days. Currently on Amiodarone and Metoprolol. Ablation scheduled for end of January. -Consider taking an extra dose of Amiodarone during episodes of AFib to facilitate conversion. -Continue Metoprolol 100mg  BID. -Check Magnesium level today and consider supplementation if low. -Communicate with electrophysiology team to discuss possibility of moving up ablation date.  Lower Extremity Edema Reports of intermittent lower extremity swelling despite daily Lasix 20mg  and Potassium supplementation. -Continue Lasix 20mg  daily and Potassium supplementation. -Consider increasing Lasix dose if edema  worsens.  Ascending Aortic Dilation Mild dilation of the ascending aorta noted on recent echocardiogram. -Continue annual echocardiograms to monitor for progression. -Ensure blood pressure control to prevent further dilation.      Dispo: Follow-up in a month with Dr. Lalla Brothers.  Signed, Sharlene Dory, PA-C

## 2023-09-24 ENCOUNTER — Ambulatory Visit: Payer: BC Managed Care – PPO | Attending: Physician Assistant | Admitting: Physician Assistant

## 2023-09-24 ENCOUNTER — Encounter: Payer: Self-pay | Admitting: Physician Assistant

## 2023-09-24 VITALS — BP 120/72 | HR 72 | Ht 72.0 in | Wt 233.8 lb

## 2023-09-24 DIAGNOSIS — R000 Tachycardia, unspecified: Secondary | ICD-10-CM | POA: Diagnosis not present

## 2023-09-24 DIAGNOSIS — I5022 Chronic systolic (congestive) heart failure: Secondary | ICD-10-CM | POA: Diagnosis not present

## 2023-09-24 DIAGNOSIS — Z79899 Other long term (current) drug therapy: Secondary | ICD-10-CM | POA: Diagnosis not present

## 2023-09-24 DIAGNOSIS — I4819 Other persistent atrial fibrillation: Secondary | ICD-10-CM

## 2023-09-24 DIAGNOSIS — I43 Cardiomyopathy in diseases classified elsewhere: Secondary | ICD-10-CM

## 2023-09-24 NOTE — Patient Instructions (Addendum)
Medication Instructions:  You may take a extra dose of your Amiodarone if you are in Afib and heart rate is greater than 120    *If you need a refill on your cardiac medications before your next appointment, please call your pharmacy*   Lab Work: Magnesium - today   If you have labs (blood work) drawn today and your tests are completely normal, you will receive your results only by: MyChart Message (if you have MyChart) OR A paper copy in the mail If you have any lab test that is abnormal or we need to change your treatment, we will call you to review the results.   Testing/Procedures: None ordered    Follow-Up: Follow up as scheduled   Other Instructions

## 2023-09-25 LAB — MAGNESIUM: Magnesium: 2 mg/dL (ref 1.6–2.3)

## 2023-09-26 NOTE — Progress Notes (Unsigned)
Assessment     Intermittent dysphagia - likely mild dysmotility, GERD with LA Grade A esophagitis, S/P Nissen with loose wrap, small HH CRC screening is up-to-date Persistent afib on Eliquis, ablation planned for Jan 2025   Recommendations    Continue pantoprazole 40 mg bid, closely follow antireflux measures If dysphagia worsens consider repeat barium esophagram, repeat EGD Screening colonoscopy in June 2027 REV in 1 year with Dr. Rhea Belton (her husband sees Dr. Rhea Belton and she requests him)   HPI    This is a 62 year old female with GERD and LA grade A esophagitis and dysphagia.  Her dysphagia symptoms did not improve after EGD with empiric dilation in 2022.  She notes problems with rice, thick breads and dry meats. Her GERD symptoms are well controlled. Barium esophagram in Sept 2022 showed disruption of 1 out of 4 primary peristaltic waves in the distal esophagus.   EGD Sept 2022 - LA Grade A reflux esophagitis with no bleeding.  - Z-line variable, 40 cm from the incisors. Biopsied.  - No endoscopic esophageal abnormality to explain patient's dysphagia. Esophagus dilated.  - Small hiatal hernia.  - Prior fundoplication.  - A few gastric polyps. Biopsied.  - A single mucosal papule (nodule) found in the stomach. Biopsied. - Normal duodenal bulb and second portion of the duodenum.  1. Surgical [P], gastric antrum - GASTRIC ANTRAL MUCOSA WITH NONSPECIFIC REACTIVE GASTROPATHY - NEGATIVE FOR INTESTINAL METAPLASIA OR DYSPLASIA - WARTHIN STARRY STAIN IS NEGATIVE FOR HELICOBACTER PYLORI 2. Surgical [P], gastric body - FUNDIC GLAND POLYP(S) - GASTRIC OXYNTIC MUCOSA WITH NO SPECIFIC HISTOPATHOLOGIC CHANGES - WARTHIN STARRY STAIN IS NEGATIVE FOR HELICOBACTER PYLORI 3. Surgical [P], z line bx's - ESOPHAGEAL SQUAMOUS AND CARDIAC MUCOSA WITH MILD CHRONIC NONSPECIFIC CARDITIS - NEGATIVE FOR INTESTINAL METAPLASIA OR DYSPLASIA   BA esophagram Sept 2022 1. Fold thickening in the mid  and distal esophagus suggesting esophagitis. No ulceration or stricture is currently identified. 2. Small recurrent type 1 hiatal hernia. Partial unwrapping of the fundoplication. 3. On initial swallowing there was a small amount of tracheal aspiration, without eliciting a cough response. This may have simply been incidental given the small amount, although the patient were to experience otherwise unexplained pneumonia then further workup for aspiration may be warranted. 4. Disruption of 1 out of 4 primary peristaltic waves in the distal esophagus   Labs / Imaging       Latest Ref Rng & Units 04/14/2022    8:12 AM 11/11/2021   12:27 PM 12/19/2016   12:00 AM  Hepatic Function  Total Protein 6.0 - 8.5 g/dL 6.6  6.8    Albumin 3.8 - 4.8 g/dL 4.3  3.7    AST 0 - 40 IU/L 27  41  19      ALT 0 - 32 IU/L 28  47  18      Alk Phosphatase 44 - 121 IU/L 84  70  76      Total Bilirubin 0.0 - 1.2 mg/dL 0.6  1.2       This result is from an external source.       Latest Ref Rng & Units 09/03/2023    3:02 PM 04/12/2023    3:39 PM 07/04/2022    8:09 AM  CBC  WBC 4.0 - 10.5 K/uL 6.0  10.5  8.7   Hemoglobin 12.0 - 15.0 g/dL 40.9  81.1  91.4   Hematocrit 36.0 - 46.0 % 38.9  36.9  36.7  Platelets 150 - 400 K/uL 243  301  259    Current Medications, Allergies, Past Medical History, Past Surgical History, Family History and Social History were reviewed in [name] record.   Physical Exam: General: Well developed, well nourished, no acute distress Head: Normocephalic and atraumatic Eyes: Sclerae anicteric, EOMI Ears: Normal auditory acuity Mouth: No deformities or lesions noted Lungs: Clear throughout to auscultation Heart: Regular rate and rhythm; No murmurs, rubs or bruits Abdomen: Soft, non tender and non distended. No masses, hepatosplenomegaly or hernias noted. Normal Bowel sounds Rectal: Not done Musculoskeletal: Symmetrical with no gross deformities  Pulses:   Normal pulses noted Extremities: No edema or deformities noted Neurological: Alert oriented x 4, grossly nonfocal Psychological:  Alert and cooperative. Normal mood and affect   Clifford Coudriet T. Russella Dar, MD 09/26/2023, 9:35 PM

## 2023-09-27 ENCOUNTER — Encounter: Payer: Self-pay | Admitting: Gastroenterology

## 2023-09-27 ENCOUNTER — Ambulatory Visit: Payer: BC Managed Care – PPO | Admitting: Gastroenterology

## 2023-09-27 VITALS — BP 128/74 | HR 66 | Ht 72.0 in | Wt 234.2 lb

## 2023-09-27 DIAGNOSIS — K2100 Gastro-esophageal reflux disease with esophagitis, without bleeding: Secondary | ICD-10-CM

## 2023-09-27 DIAGNOSIS — R1319 Other dysphagia: Secondary | ICD-10-CM

## 2023-09-27 NOTE — Patient Instructions (Signed)
Continue pantoprazole twice daily.   Follow up with Dr. Rhea Belton in one year. Please call our office in 3-4 months for an appt.   [facility] providers would like to encourage you to use [facility] to communicate with providers for non-urgent requests or questions.  Due to long hold times on the telephone, sending your provider a message by [facility] may be a faster and more efficient way to get a response.  Please allow 48 business hours for a response.  Please remember that this is for non-urgent requests.   Thank you for choosing me and [facility].  Wanda Moore. Pleas Koch., MD., [name]

## 2023-09-28 ENCOUNTER — Other Ambulatory Visit: Payer: Self-pay | Admitting: Cardiology

## 2023-10-10 ENCOUNTER — Telehealth (HOSPITAL_COMMUNITY): Payer: Self-pay | Admitting: *Deleted

## 2023-10-10 NOTE — Telephone Encounter (Signed)
Patient wanted recent afib episodes documented. Over the last 3 weeks she has had 4 afib episodes lasting between 6 hours to 1.5 days. She does convert on her own and pending ablation in January. Pt encouraged to continue all medications are prescribed and call if persistently in afib. Pt verbalized agreement.

## 2023-10-23 ENCOUNTER — Ambulatory Visit: Payer: BC Managed Care – PPO | Admitting: Cardiology

## 2023-10-24 ENCOUNTER — Other Ambulatory Visit: Payer: Self-pay | Admitting: Physician Assistant

## 2023-10-24 DIAGNOSIS — R1310 Dysphagia, unspecified: Secondary | ICD-10-CM

## 2023-10-25 ENCOUNTER — Ambulatory Visit: Payer: BC Managed Care – PPO | Attending: Cardiology | Admitting: Cardiology

## 2023-10-25 ENCOUNTER — Encounter: Payer: Self-pay | Admitting: Cardiology

## 2023-10-25 VITALS — BP 126/80 | HR 59 | Ht 72.0 in | Wt 232.0 lb

## 2023-10-25 DIAGNOSIS — Z79899 Other long term (current) drug therapy: Secondary | ICD-10-CM

## 2023-10-25 DIAGNOSIS — I5022 Chronic systolic (congestive) heart failure: Secondary | ICD-10-CM

## 2023-10-25 DIAGNOSIS — I4819 Other persistent atrial fibrillation: Secondary | ICD-10-CM

## 2023-10-25 NOTE — Patient Instructions (Signed)
Medication Instructions:  Your physician recommends that you continue on your current medications as directed. Please refer to the Current Medication list given to you today.  *If you need a refill on your cardiac medications before your next appointment, please call your pharmacy   Follow-Up: At [facility], you and your health needs are our priority.  As part of our continuing mission to provide you with exceptional heart care, we have created designated Provider Care Teams.  These Care Teams include your primary Cardiologist (physician) and Advanced Practice Providers (APPs -  Physician Assistants and Nurse Practitioners) who all work together to provide you with the care you need, when you need it.  Your next appointment:   As scheduled  

## 2023-10-25 NOTE — Progress Notes (Signed)
  Electrophysiology Office Follow up Visit Note:    Date:  10/25/2023   ID:  Wanda Moore, DOB 05-17-1961, MRN [id number]  PCP:  Melida Quitter, MD  [facility] Cardiologist:  Donato Schultz, MD  [facility] Electrophysiologist:  Lanier Prude, MD    Interval History:     Wanda Moore is a 62 y.o. female who presents for a follow up visit.    She was last seen by [facility] 09/03/2023. She had an ablation 07/25/2022 during which the veins and posterior wall were isolated. After her prior ablation, the amiodarone was stopped. She has experienced a symptomatic recurrence of AF and is scheduled for repeat ablation 12/03/2023. She is taking eliquis and is back on amiodarone.  Today she confirms the above.  She is interested in avoiding long-term medication use to control her atrial fibrillation.       Past medical, surgical, social and family history were reviewed.  ROS:   Please see the history of present illness.    All other systems reviewed and are negative.  EKGs/Labs/Other Studies Reviewed:    The following studies were reviewed today:  09/11/2023 Echo EF 55 RV normal No significant valve disease        Physical Exam:    VS:  BP 126/80 (BP Location: Right Arm, Patient Position: Sitting)   Pulse (!) 59   Ht 6' (1.829 m)   Wt 232 lb (105.2 kg)   SpO2 99%   BMI 31.46 kg/m     Wt Readings from Last 3 Encounters:  10/25/23 232 lb (105.2 kg)  09/27/23 234 lb 3.2 oz (106.2 kg)  09/24/23 233 lb 12.8 oz (106.1 kg)     GEN: no distress CARD: RRR, No MRG RESP: No IWOB. CTAB.      ASSESSMENT:    1. Persistent atrial fibrillation (HCC)   2. Chronic systolic heart failure (HCC)   3. Encounter for long-term (current) use of high-risk medication    PLAN:    In order of problems listed above:  #Persistent AF #High risk med - amiodarone She did well initially after her 2023 ablation procedure but has unfortunately experienced a symptomatic  recurrence. She has been restarted on amiodarone but given her young age, this is not an ideal long term solution. I have discussed the treatment options with the patient and she wishes to pursue redo catheter ablation.  Discussed treatment options today for AF including antiarrhythmic drug therapy and ablation. Discussed risks, recovery and likelihood of success with each treatment strategy. Risk, benefits, and alternatives to EP study and ablation for afib were discussed. These risks include but are not limited to stroke, bleeding, vascular damage, tamponade, perforation, damage to the esophagus, lungs, phrenic nerve and other structures, pulmonary vein stenosis, worsening renal function, coronary vasospasm and death.  Discussed potential need for repeat ablation procedures and antiarrhythmic drugs after an initial ablation. The patient understands these risk and wishes to proceed.  We will therefore proceed with catheter ablation at the next available time.  Carto, ICE, anesthesia are requested for the procedure.  Will also obtain CT PV protocol prior to the procedure to exclude LAA thrombus and further evaluate atrial anatomy.  #Chronic systolic heart failure NYHA II. Warm and dry on exam today. EF recovered, now 55. Continue GDMT. Rhythm control indicated as above.    Signed, Steffanie Dunn, MD, [facility], [facility] 10/25/2023 8:08 PM    Electrophysiology [facility]

## 2023-11-05 ENCOUNTER — Other Ambulatory Visit: Payer: Self-pay | Admitting: *Deleted

## 2023-11-05 DIAGNOSIS — I4819 Other persistent atrial fibrillation: Secondary | ICD-10-CM

## 2023-11-06 LAB — CBC
Hematocrit: 41.2 % (ref 34.0–46.6)
Hemoglobin: 13.7 g/dL (ref 11.1–15.9)
MCH: 27 pg (ref 26.6–33.0)
MCHC: 33.3 g/dL (ref 31.5–35.7)
MCV: 81 fL (ref 79–97)
Platelets: 265 10*3/uL (ref 150–450)
RBC: 5.07 x10E6/uL (ref 3.77–5.28)
RDW: 13.2 % (ref 11.7–15.4)
WBC: 7.7 10*3/uL (ref 3.4–10.8)

## 2023-11-06 LAB — BASIC METABOLIC PANEL
BUN/Creatinine Ratio: 15 (ref 12–28)
BUN: 11 mg/dL (ref 8–27)
CO2: 26 mmol/L (ref 20–29)
Calcium: 9.6 mg/dL (ref 8.7–10.3)
Chloride: 105 mmol/L (ref 96–106)
Creatinine, Ser: 0.73 mg/dL (ref 0.57–1.00)
Glucose: 76 mg/dL (ref 70–99)
Potassium: 4.7 mmol/L (ref 3.5–5.2)
Sodium: 144 mmol/L (ref 134–144)
eGFR: 92 mL/min/{1.73_m2} (ref >59)

## 2023-11-08 ENCOUNTER — Telehealth (HOSPITAL_COMMUNITY): Payer: Self-pay | Admitting: Emergency Medicine

## 2023-11-08 NOTE — Telephone Encounter (Signed)
 Reaching out to patient to offer assistance regarding upcoming cardiac imaging study; pt verbalizes understanding of appt date/time, parking situation and where to check in, pre-test NPO status and medications ordered, and verified current allergies; name and call back number provided for further questions should they arise Rockwell Alexandria RN Navigator Cardiac Imaging [facility] [phone] office [phone] cell

## 2023-11-09 ENCOUNTER — Ambulatory Visit (HOSPITAL_COMMUNITY)
Admission: RE | Admit: 2023-11-09 | Discharge: 2023-11-09 | Disposition: A | Discharge status: Home / Self Care | Payer: BC Managed Care – PPO | Source: Ambulatory Visit | Attending: Cardiology | Admitting: Cardiology

## 2023-11-09 DIAGNOSIS — I4819 Other persistent atrial fibrillation: Secondary | ICD-10-CM | POA: Diagnosis not present

## 2023-11-09 MED ORDER — IOHEXOL 350 MG/ML SOLN
95.0000 mL | Freq: Once | INTRAVENOUS | Status: AC | PRN
  Administered 2023-11-09: 95 mL via INTRAVENOUS

## 2023-11-13 NOTE — Progress Notes (Deleted)
 Cardiology Office Note:  .   Date:  11/13/2023  ID:  Wanda Moore, DOB 08-20-1961, MRN [id number] PCP: Stephane Leita DEL, MD  [facility] Providers Cardiologist:  Oneil Parchment, MD Electrophysiologist:  OLE ONEIDA HOLTS, MD {  History of Present Illness: .   Wanda Moore is a 63 y.o. female w/PMHx of breast cancer (L, s/p lumpectomy, XRT)NICM (tachy-mediated), AFib,   She saw Dr. Holts 12/04/22, doing well post ablation without symptoms of her AF and her amiodarone  stopped  She saw Dr. Parchment 03/16/23, doing well, DOE with stairs particularly, encouraged regular exercise, added Jardiance  for HF management.  Saw the Afib clinic 04/12/23 s/p UCC visit for poison ivy treated with IM and PO steroid >> recurrent Afib Toprol  increased temporarily until she get DCCV, was pending insurance auth for Jardiance   Spontaneous CV back to SR and did not need DCCV >> back to baseline Toprol  dosing  07/30/23> pt called with recurrent symptomatic Afib via AFib clinic advised to increase her Toprol  again and f/u in a couple days >> converted by phone note and advised to return back to 75 BID  I saw her 08/16/23 very symptomatic with her AFib and has hx of NICM felt 2/2 to her RVR Recommend re-do ablation which she is agreeable to Will increase her toprol  to 100mg  BID  AFib clinic 08/20/23 > in rapid AFib > amiodarone  as a bridge to ablation AFib clinic 10/28 > in/out of Afib by pt report, amio BID dosing continued for another couple weeks  Dr. Holts 10/25/23, planned fro ablation Scheduled for 12/02/22 CT completed (no thrombus)  Today's visit is scheduled as a 3 mo visit  ROS:   *** procedure questions *** burden on amio? *** amio labs, procedure labs... *** eliquis , dose, bleeding, compliance *** symptoms  Arrhythmia/AAD hx Diagnosed Nov 2022 Amiodarone  started Dec 2022 >> stopped Jan 2024 post ablatiopn PVI ablation 07/25/22 Amiodarone  restarted Oct 2024 with  recurrent/symptomatic Afib  Studies Reviewed: SABRA    EKG done today and reviewed by myself ***  Atrial Fibrillation Ablation 07/25/2022: CONCLUSIONS: 1. Successful PVI 2. Successful ablation/isolation of the posterior wall 3. Intracardiac echo reveals trivial pericardial effusion and normal left atrial architecture 4. No early apparent complications. 5.  Colchicine  0.6 mg by mouth twice daily for 5 days 6.  Protonix  40 mg by mouth once daily for 45 days   CT Cardiac & CA Score 07/18/2022: IMPRESSION: 1. There is normal pulmonary vein drainage into the left atrium with ostial measurements above.   2. There is no thrombus in the left atrial appendage.   3. The esophagus runs in the left atrial midline and is not in proximity to any of the pulmonary vein ostia.   4. No PFO/ASD.   5. Normal coronary origin. Right dominance.   6. CAC score of 0 which is 0 percentile for age-, race-, and sex-matched controls.   7. Mild aortic atherosclerosis.   ECHO 02/09/22:  1. Left ventricular ejection fraction, by estimation, is 45 to 50%. The  left ventricle has mildly decreased function. The left ventricle  demonstrates global hypokinesis. The left ventricular internal cavity size  was mildly dilated. There is mild  asymmetric left ventricular hypertrophy of the basal-septal segment. Left  ventricular diastolic parameters are indeterminate.   2. Right ventricular systolic function is normal. The right ventricular  size is mildly enlarged. Tricuspid regurgitation signal is inadequate for  assessing PA pressure.   3. The mitral valve is grossly normal.  Mild to moderate mitral valve  regurgitation. No evidence of mitral stenosis.   4. The aortic valve is tricuspid. Aortic valve regurgitation is mild. No  aortic stenosis is present.   5. The inferior vena cava is normal in size with greater than 50%  respiratory variability, suggesting right atrial pressure of 3 mmHg.   Comparison(s):  Changes from prior study are noted. EF has improved to  45-50%. Mild to moderate MR.    Risk Assessment/Calculations:    Physical Exam:   VS:  There were no vitals taken for this visit.   Wt Readings from Last 3 Encounters:  10/25/23 232 lb (105.2 kg)  09/27/23 234 lb 3.2 oz (106.2 kg)  09/24/23 233 lb 12.8 oz (106.1 kg)    GEN: Well nourished, well developed in no acute distress NECK: No JVD; No carotid bruits CARDIAC: *** RRR, no murmurs, rubs, gallops RESPIRATORY: *** CTA b/l without rales, wheezing or rhonchi  ABDOMEN: Soft, non-tender, non-distended EXTREMITIES: *** No edema; No deformity    ASSESSMENT AND PLAN: .    persistent AFib CHA2DS2Vasc is 2 (gender, CM), on Eliquis , *** appropriately dosed ***  NICM Felt tachy-mediated Improved by echo 2023 45-50% *** No exam findings or symptoms of volume OL C/w Dr. Beverlee  Secondary hypercoagulable state 2/2 AFib    Dispo: will put a 3 mo visit in place, I have asked her to reach out to us  if she doesn't hear back from Dr. Hiram nurse regarding possible repeat ablation  Signed, Charlies Macario Arthur, PA-C

## 2023-11-15 ENCOUNTER — Encounter: Payer: Self-pay | Admitting: Physician Assistant

## 2023-11-15 MED ORDER — FUROSEMIDE 20 MG PO TABS: 20.0000 mg | ORAL_TABLET | Freq: Every day | ORAL | 1 refills | Status: DC | PRN

## 2023-11-15 MED ORDER — POTASSIUM CHLORIDE CRYS ER 10 MEQ PO TBCR: 10.0000 meq | EXTENDED_RELEASE_TABLET | Freq: Every day | ORAL | 1 refills | Status: DC | PRN

## 2023-11-16 ENCOUNTER — Ambulatory Visit: Payer: BC Managed Care – PPO | Admitting: Physician Assistant

## 2023-11-30 NOTE — Pre-Procedure Instructions (Signed)
Instructed patient on the following items: Arrival time 0515 Nothing to eat or drink after midnight No meds AM of procedure Responsible person to drive you home and stay with you for 24 hrs  Have you missed any doses of anti-coagulant Eliquis- takes twice a day, hasn't missed any doses.  Don't take dose on Monday morning.

## 2023-12-01 ENCOUNTER — Other Ambulatory Visit (HOSPITAL_COMMUNITY): Payer: Self-pay | Admitting: Physician Assistant

## 2023-12-03 ENCOUNTER — Other Ambulatory Visit (HOSPITAL_COMMUNITY): Payer: Self-pay

## 2023-12-03 ENCOUNTER — Ambulatory Visit (HOSPITAL_COMMUNITY)
Admission: RE | Disposition: A | Discharge status: Home / Self Care | Payer: BC Managed Care – PPO | Source: Home / Self Care | Attending: Cardiology

## 2023-12-03 ENCOUNTER — Encounter (HOSPITAL_COMMUNITY): Payer: Self-pay | Admitting: Cardiology

## 2023-12-03 ENCOUNTER — Ambulatory Visit (HOSPITAL_COMMUNITY): Payer: BC Managed Care – PPO

## 2023-12-03 ENCOUNTER — Other Ambulatory Visit: Payer: Self-pay

## 2023-12-03 ENCOUNTER — Ambulatory Visit (HOSPITAL_COMMUNITY)
Admission: RE | Admit: 2023-12-03 | Discharge: 2023-12-03 | Disposition: A | Discharge status: Home / Self Care | Payer: BC Managed Care – PPO | Attending: Cardiology | Admitting: Cardiology

## 2023-12-03 DIAGNOSIS — I4891 Unspecified atrial fibrillation: Secondary | ICD-10-CM | POA: Diagnosis not present

## 2023-12-03 DIAGNOSIS — I5022 Chronic systolic (congestive) heart failure: Secondary | ICD-10-CM | POA: Diagnosis not present

## 2023-12-03 DIAGNOSIS — I4819 Other persistent atrial fibrillation: Secondary | ICD-10-CM | POA: Diagnosis not present

## 2023-12-03 DIAGNOSIS — Z7984 Long term (current) use of oral hypoglycemic drugs: Secondary | ICD-10-CM | POA: Insufficient documentation

## 2023-12-03 DIAGNOSIS — Z7901 Long term (current) use of anticoagulants: Secondary | ICD-10-CM | POA: Insufficient documentation

## 2023-12-03 DIAGNOSIS — E119 Type 2 diabetes mellitus without complications: Secondary | ICD-10-CM | POA: Insufficient documentation

## 2023-12-03 DIAGNOSIS — K219 Gastro-esophageal reflux disease without esophagitis: Secondary | ICD-10-CM | POA: Insufficient documentation

## 2023-12-03 HISTORY — PX: ATRIAL FIBRILLATION ABLATION: EP1191

## 2023-12-03 LAB — POCT ACTIVATED CLOTTING TIME: Activated Clotting Time: 262 s

## 2023-12-03 SURGERY — ATRIAL FIBRILLATION ABLATION
Anesthesia: General

## 2023-12-03 MED ORDER — PROTAMINE SULFATE 10 MG/ML IV SOLN
INTRAVENOUS | Status: DC | PRN
  Administered 2023-12-03: 10 mg via INTRAVENOUS
  Administered 2023-12-03: 20 mg via INTRAVENOUS

## 2023-12-03 MED ORDER — ROCURONIUM BROMIDE 10 MG/ML (PF) SYRINGE
PREFILLED_SYRINGE | INTRAVENOUS | Status: DC | PRN
  Administered 2023-12-03: 70 mg via INTRAVENOUS
  Administered 2023-12-03: 30 mg via INTRAVENOUS
  Administered 2023-12-03: 20 mg via INTRAVENOUS

## 2023-12-03 MED ORDER — SODIUM CHLORIDE 0.9 % IV SOLN: INTRAVENOUS | Status: DC

## 2023-12-03 MED ORDER — ONDANSETRON HCL 4 MG/2ML IJ SOLN: 4.0000 mg | Freq: Four times a day (QID) | INTRAMUSCULAR | Status: DC | PRN

## 2023-12-03 MED ORDER — ATROPINE SULFATE 1 MG/10ML IJ SOSY
PREFILLED_SYRINGE | INTRAMUSCULAR | Status: DC | PRN
  Administered 2023-12-03: 1 mg via INTRAVENOUS

## 2023-12-03 MED ORDER — HEPARIN SODIUM (PORCINE) 1000 UNIT/ML IJ SOLN
INTRAMUSCULAR | Status: AC
  Filled 2023-12-03: qty 1

## 2023-12-03 MED ORDER — PHENYLEPHRINE HCL-NACL 20-0.9 MG/250ML-% IV SOLN
INTRAVENOUS | Status: DC | PRN
  Administered 2023-12-03: 50 ug/min via INTRAVENOUS

## 2023-12-03 MED ORDER — PROTAMINE SULFATE 10 MG/ML IV SOLN: INTRAVENOUS | Status: DC | PRN

## 2023-12-03 MED ORDER — SODIUM CHLORIDE 0.9 % IV SOLN: 250.0000 mL | INTRAVENOUS | Status: DC | PRN

## 2023-12-03 MED ORDER — APIXABAN 5 MG PO TABS
5.0000 mg | ORAL_TABLET | Freq: Two times a day (BID) | ORAL | Status: DC
  Administered 2023-12-03: 5 mg via ORAL
  Filled 2023-12-03: qty 1

## 2023-12-03 MED ORDER — ACETAMINOPHEN 325 MG PO TABS
650.0000 mg | ORAL_TABLET | ORAL | Status: DC | PRN
Start: 2023-12-03 — End: 2023-12-03

## 2023-12-03 MED ORDER — HEPARIN (PORCINE) IN NACL 1000-0.9 UT/500ML-% IV SOLN
INTRAVENOUS | Status: DC | PRN
  Administered 2023-12-03 (×3): 500 mL

## 2023-12-03 MED ORDER — HEPARIN SODIUM (PORCINE) 1000 UNIT/ML IJ SOLN
INTRAMUSCULAR | Status: DC | PRN
  Administered 2023-12-03: 7000 [IU] via INTRAVENOUS
  Administered 2023-12-03: 15000 [IU] via INTRAVENOUS

## 2023-12-03 MED ORDER — SODIUM CHLORIDE 0.9% FLUSH: 3.0000 mL | INTRAVENOUS | Status: DC | PRN

## 2023-12-03 MED ORDER — COLCHICINE 0.6 MG PO TABS
0.6000 mg | ORAL_TABLET | Freq: Two times a day (BID) | ORAL | 0 refills | Status: DC
  Filled 2023-12-03: qty 10, 5d supply, fill #0

## 2023-12-03 MED ORDER — DEXAMETHASONE SODIUM PHOSPHATE 10 MG/ML IJ SOLN
INTRAMUSCULAR | Status: DC | PRN
  Administered 2023-12-03: 10 mg via INTRAVENOUS

## 2023-12-03 MED ORDER — SODIUM CHLORIDE 0.9 % IV SOLN: INTRAVENOUS | Status: DC | PRN

## 2023-12-03 MED ORDER — COLCHICINE 0.6 MG PO TABS
0.6000 mg | ORAL_TABLET | Freq: Two times a day (BID) | ORAL | Status: DC
  Administered 2023-12-03: 0.6 mg via ORAL
  Filled 2023-12-03 (×2): qty 1

## 2023-12-03 MED ORDER — PANTOPRAZOLE SODIUM 40 MG PO TBEC
40.0000 mg | DELAYED_RELEASE_TABLET | Freq: Every day | ORAL | Status: DC
  Administered 2023-12-03: 40 mg via ORAL
  Filled 2023-12-03 (×2): qty 1

## 2023-12-03 MED ORDER — FENTANYL CITRATE (PF) 100 MCG/2ML IJ SOLN
INTRAMUSCULAR | Status: AC
  Filled 2023-12-03: qty 2

## 2023-12-03 MED ORDER — FENTANYL CITRATE (PF) 250 MCG/5ML IJ SOLN
INTRAMUSCULAR | Status: DC | PRN
  Administered 2023-12-03 (×2): 50 ug via INTRAVENOUS

## 2023-12-03 MED ORDER — SUGAMMADEX SODIUM 200 MG/2ML IV SOLN
INTRAVENOUS | Status: DC | PRN
  Administered 2023-12-03: 400 mg via INTRAVENOUS

## 2023-12-03 MED ORDER — LIDOCAINE 2% (20 MG/ML) 5 ML SYRINGE
INTRAMUSCULAR | Status: DC | PRN
  Administered 2023-12-03: 60 mg via INTRAVENOUS

## 2023-12-03 MED ORDER — SODIUM CHLORIDE 0.9% FLUSH: 3.0000 mL | Freq: Two times a day (BID) | INTRAVENOUS | Status: DC

## 2023-12-03 MED ORDER — PROPOFOL 10 MG/ML IV BOLUS
INTRAVENOUS | Status: DC | PRN
  Administered 2023-12-03: 150 mg via INTRAVENOUS

## 2023-12-03 MED ORDER — FUROSEMIDE 20 MG PO TABS: 20.0000 mg | ORAL_TABLET | Freq: Every day | ORAL | Status: DC | PRN

## 2023-12-03 MED ORDER — ONDANSETRON HCL 4 MG/2ML IJ SOLN
INTRAMUSCULAR | Status: DC | PRN
  Administered 2023-12-03: 4 mg via INTRAVENOUS

## 2023-12-03 MED ORDER — HEPARIN SODIUM (PORCINE) 1000 UNIT/ML IJ SOLN
INTRAMUSCULAR | Status: DC | PRN
  Administered 2023-12-03: 1000 [IU] via INTRAVENOUS

## 2023-12-03 MED ORDER — PHENYLEPHRINE 80 MCG/ML (10ML) SYRINGE FOR IV PUSH (FOR BLOOD PRESSURE SUPPORT)
PREFILLED_SYRINGE | INTRAVENOUS | Status: DC | PRN
  Administered 2023-12-03: 160 ug via INTRAVENOUS
  Administered 2023-12-03 (×2): 80 ug via INTRAVENOUS

## 2023-12-03 SURGICAL SUPPLY — 19 items
BAG SNAP BAND KOVER 36X36 (MISCELLANEOUS) IMPLANT
CABLE PFA RX CATH CONN (CABLE) IMPLANT
CATH FARAWAVE ABLATION 31 (CATHETERS) IMPLANT
CATH OCTARAY 2.0 F 3-3-3-3-3 (CATHETERS) IMPLANT
CATH SOUNDSTAR ECO 8FR (CATHETERS) IMPLANT
CATH WEBSTER BI DIR CS D-F CRV (CATHETERS) IMPLANT
CLOSURE PERCLOSE PROSTYLE (VASCULAR PRODUCTS) IMPLANT
COVER SWIFTLINK CONNECTOR (BAG) ×1 IMPLANT
DILATOR VESSEL 38 20CM 16FR (INTRODUCER) IMPLANT
GUIDEWIRE INQWIRE 1.5J.035X260 (WIRE) IMPLANT
INQWIRE 1.5J .035X260CM (WIRE) ×1
PACK EP LF (CUSTOM PROCEDURE TRAY) ×1 IMPLANT
PAD DEFIB RADIO PHYSIO CONN (PAD) ×1 IMPLANT
PATCH CARTO3 (PAD) IMPLANT
SHEATH FARADRIVE STEERABLE (SHEATH) IMPLANT
SHEATH PINNACLE 8F 10CM (SHEATH) IMPLANT
SHEATH PINNACLE 9F 10CM (SHEATH) IMPLANT
SHEATH PROBE COVER 6X72 (BAG) IMPLANT
SHEATH WIRE KIT BAYLIS SL1 (KITS) IMPLANT

## 2023-12-03 NOTE — Anesthesia Procedure Notes (Signed)
Procedure Name: Intubation Date/Time: 12/03/2023 7:50 AM  Performed by: Alease Medina, CRNAPre-anesthesia Checklist: Patient identified, Emergency Drugs available, Suction available and Patient being monitored Patient Re-evaluated:Patient Re-evaluated prior to induction Oxygen Delivery Method: Circle system utilized Preoxygenation: Pre-oxygenation with 100% oxygen Induction Type: IV induction Ventilation: Mask ventilation without difficulty Laryngoscope Size: Mac and 4 Grade View: Grade I Tube type: Oral Tube size: 7.5 mm Number of attempts: 1 Airway Equipment and Method: Stylet Placement Confirmation: ETT inserted through vocal cords under direct vision, positive ETCO2 and breath sounds checked- equal and bilateral Tube secured with: Tape Dental Injury: Teeth and Oropharynx as per pre-operative assessment

## 2023-12-03 NOTE — Anesthesia Postprocedure Evaluation (Signed)
Anesthesia Post Note  Patient: Benedetta Sundstrom  Procedure(s) Performed: ATRIAL FIBRILLATION ABLATION     Patient location during evaluation: PACU Anesthesia Type: General Level of consciousness: awake and alert, oriented and patient cooperative Pain management: pain level controlled Vital Signs Assessment: post-procedure vital signs reviewed and stable Respiratory status: spontaneous breathing, nonlabored ventilation and respiratory function stable Cardiovascular status: blood pressure returned to baseline and stable Postop Assessment: no apparent nausea or vomiting Anesthetic complications: no   No notable events documented.  Last Vitals:  Vitals:   12/03/23 1015 12/03/23 1030  BP: 108/67 116/69  Pulse: 64 62  Resp:  16  Temp:    SpO2: 92% 91%    Last Pain:  Vitals:   12/03/23 1015  TempSrc:   PainSc: 0-No pain                 Lannie Fields

## 2023-12-03 NOTE — Progress Notes (Signed)
Pt states she went into AFIB last night and is still in AFIB.  Pt took Eliquis last night and has not missed any doses in 2 weeks.  Reported to Michela Pitcher

## 2023-12-03 NOTE — Discharge Instructions (Addendum)
Cardiac Ablation, Care After   **You should take your Protonix as previously prescribed (twice daily).  If you get home and find you do not have enough, reach out to Canary Brim, NP via MyChart.  You should take protonix once daily post ablation for 45 days.     This sheet gives you information about how to care for yourself after your procedure. Your health care provider may also give you more specific instructions. If you have problems or questions, contact your health care provider. What can I expect after the procedure? After the procedure, it is common to have: Bruising around your puncture site. Tenderness around your puncture site. Skipped heartbeats. If you had an atrial fibrillation ablation, you may have atrial fibrillation during the first several months after your procedure.  Tiredness (fatigue).  Follow these instructions at home: Puncture site care  Follow instructions from your health care provider about how to take care of your puncture site. Make sure you: If present, leave stitches (sutures), skin glue, or adhesive strips in place. These skin closures may need to stay in place for up to 2 weeks. If adhesive strip edges start to loosen and curl up, you may trim the loose edges. Do not remove adhesive strips completely unless your health care provider tells you to do that. If a large square bandage is present, this may be removed 24 hours after surgery.  Check your puncture site every day for signs of infection. Check for: Redness, swelling, or pain. Fluid or blood. If your puncture site starts to bleed, lie down on your back, apply firm pressure to the area, and contact your health care provider. Warmth. Pus or a bad smell. A pea or marble sized lump/knot at the site is normal and can take up to three months to resolve.  Driving Do not drive for at least 4 days after your procedure or however long your health care provider recommends. (Do not resume driving if you have  previously been instructed not to drive for other health reasons.) Do not drive or use heavy machinery while taking prescription pain medicine. Activity Avoid activities that take a lot of effort for at least 7 days after your procedure. Do not lift anything that is heavier than 5 lb (4.5 kg) for one week.  No sexual activity for 1 week.  Return to your normal activities as told by your health care provider. Ask your health care provider what activities are safe for you. General instructions Take over-the-counter and prescription medicines only as told by your health care provider. Do not use any products that contain nicotine or tobacco, such as cigarettes and e-cigarettes. If you need help quitting, ask your health care provider. You may shower after 24 hours, but Do not take baths, swim, or use a hot tub for 1 week.  Do not drink alcohol for 24 hours after your procedure. Keep all follow-up visits as told by your health care provider. This is important. Contact a health care provider if: You have redness, mild swelling, or pain around your puncture site. You have fluid or blood coming from your puncture site that stops after applying firm pressure to the area. Your puncture site feels warm to the touch. You have pus or a bad smell coming from your puncture site. You have a fever. You have chest pain or discomfort that spreads to your neck, jaw, or arm. You have chest pain that is worse with lying on your back or taking a deep breath.  You are sweating a lot. You feel nauseous. You have a fast or irregular heartbeat. You have shortness of breath. You are dizzy or light-headed and feel the need to lie down. You have pain or numbness in the arm or leg closest to your puncture site. Get help right away if: Your puncture site suddenly swells. Your puncture site is bleeding and the bleeding does not stop after applying firm pressure to the area. These symptoms may represent a serious  problem that is an emergency. Do not wait to see if the symptoms will go away. Get medical help right away. Call your local emergency services (911 in the [place] Do not drive yourself to the hospital. Summary After the procedure, it is normal to have bruising and tenderness at the puncture site in your groin, neck, or forearm. Check your puncture site every day for signs of infection. Get help right away if your puncture site is bleeding and the bleeding does not stop after applying firm pressure to the area. This is a medical emergency. This information is not intended to replace advice given to you by your health care provider. Make sure you discuss any questions you have with your health care provider.

## 2023-12-03 NOTE — Transfer of Care (Signed)
Immediate Anesthesia Transfer of Care Note  Patient: Wanda Moore  Procedure(s) Performed: ATRIAL FIBRILLATION ABLATION  Patient Location: PACU and Cath Lab  Anesthesia Type:General  Level of Consciousness: awake, alert , oriented, and patient cooperative  Airway & Oxygen Therapy: Patient Spontanous Breathing and Patient connected to nasal cannula oxygen  Post-op Assessment: Report given to RN and Post -op Vital signs reviewed and stable  Post vital signs: Reviewed and stable  Last Vitals:  Vitals Value Taken Time  BP    Temp    Pulse    Resp    SpO2      Last Pain:  Vitals:   12/03/23 0556  TempSrc:   PainSc: 0-No pain         Complications: No notable events documented.

## 2023-12-03 NOTE — H&P (Signed)
Electrophysiology Office Follow up Visit Note:     Date:  12/03/2023    ID:  Wanda Moore, DOB 1961-08-10, MRN [id number]   PCP:  Melida Quitter, MD         [facility] Cardiologist:  Donato Schultz, MD  [facility] Electrophysiologist:  Lanier Prude, MD      Interval History:       Wanda Moore is a 63 y.o. female who presents for a follow up visit.      She was last seen by [facility] 09/03/2023. She had an ablation 07/25/2022 during which the veins and posterior wall were isolated. After her prior ablation, the amiodarone was stopped. She has experienced a symptomatic recurrence of AF and is scheduled for repeat ablation 12/03/2023. She is taking eliquis and is back on amiodarone.   Today she confirms the above.  She is interested in avoiding long-term medication use to control her atrial fibrillation.  Presents for AF ablation today. Procedure reviewed   Objective Past medical, surgical, social and family history were reviewed.   ROS:   Please see the history of present illness.    All other systems reviewed and are negative.   EKGs/Labs/Other Studies Reviewed:     The following studies were reviewed today:   09/11/2023 Echo EF 55 RV normal No significant valve disease           Physical Exam:     VS:  BP 130/79 (BP Location: Right Arm, Patient Position: Sitting)   Pulse 101   Ht 6' (1.829 m)   Wt 232 lb (105.2 kg)   SpO2 99%   BMI 31.46 kg/m         Wt Readings from Last 3 Encounters:  10/25/23 232 lb (105.2 kg)  09/27/23 234 lb 3.2 oz (106.2 kg)  09/24/23 233 lb 12.8 oz (106.1 kg)      GEN: no distress CARD: irregularly irregular, No MRG RESP: No IWOB. CTAB.     Assessment ASSESSMENT:     1. Persistent atrial fibrillation (HCC)   2. Chronic systolic heart failure (HCC)   3. Encounter for long-term (current) use of high-risk medication     PLAN:     In order of problems listed above:   #Persistent AF #High risk med -  amiodarone She did well initially after her 2023 ablation procedure but has unfortunately experienced a symptomatic recurrence. She has been restarted on amiodarone but given her young age, this is not an ideal long term solution. I have discussed the treatment options with the patient and she wishes to pursue redo catheter ablation.   Discussed treatment options today for AF including antiarrhythmic drug therapy and ablation. Discussed risks, recovery and likelihood of success with each treatment strategy. Risk, benefits, and alternatives to EP study and ablation for afib were discussed. These risks include but are not limited to stroke, bleeding, vascular damage, tamponade, perforation, damage to the esophagus, lungs, phrenic nerve and other structures, pulmonary vein stenosis, worsening renal function, coronary vasospasm and death.  Discussed potential need for repeat ablation procedures and antiarrhythmic drugs after an initial ablation. The patient understands these risk and wishes to proceed.  We will therefore proceed with catheter ablation at the next available time.  Carto, ICE, anesthesia are requested for the procedure.  Will also obtain CT PV protocol prior to the procedure to exclude LAA thrombus and further evaluate atrial anatomy.   #Chronic systolic heart failure NYHA II. Warm and dry on  exam today. EF recovered, now 55. Continue GDMT. Rhythm control indicated as above.    Presents for PVI today. Procedure reviewed.   Signed, Steffanie Dunn, MD, [facility], [facility] 12/03/2023 Electrophysiology [facility]

## 2023-12-03 NOTE — Anesthesia Preprocedure Evaluation (Addendum)
Anesthesia Evaluation  Patient identified by MRN, date of birth, ID band Patient awake    Reviewed: Allergy & Precautions, NPO status , Patient's Chart, lab work & pertinent test results  History of Anesthesia Complications Negative for: history of anesthetic complications  Airway Mallampati: III  TM Distance: >3 FB Neck ROM: Full    Dental  (+) Teeth Intact, Dental Advisory Given   Pulmonary neg pulmonary ROS   Pulmonary exam normal breath sounds clear to auscultation       Cardiovascular Normal cardiovascular exam+ dysrhythmias (eliquis LD last night) Atrial Fibrillation  Rhythm:Regular Rate:Normal  TTE 09/11/23:  1. Left ventricular ejection fraction, by estimation, is 55 to 60%. Left  ventricular ejection fraction by 3D volume is 56 %. The left ventricle has  normal function. The left ventricle has no regional wall motion  abnormalities. There is mild concentric  left ventricular hypertrophy. Left ventricular diastolic parameters are  consistent with Grade II diastolic dysfunction (pseudonormalization). The  average left ventricular global longitudinal strain is -19.2 %. The global  longitudinal strain is normal.   2. Right ventricular systolic function is normal. The right ventricular  size is mildly enlarged.   3. 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 Evidence of atrial level shunting  detected by color flow Doppler.   4. The mitral valve is normal in structure. Trivial mitral valve  regurgitation. No evidence of mitral stenosis.   5. The aortic valve is tricuspid. Aortic valve regurgitation is trivial.  No aortic stenosis is present.   6. Aortic dilatation noted. There is mild dilatation of the ascending  aorta, measuring 40 mm.   7. The inferior vena cava is normal in size with greater than 50%  respiratory variability, suggesting right atrial pressure of 3  mmHg.     Neuro/Psych  PSYCHIATRIC DISORDERS Anxiety     negative neurological ROS     GI/Hepatic Neg liver ROS, hiatal hernia,GERD  Medicated and Controlled,,  Endo/Other  diabetes, Oral Hypoglycemic Agents    Renal/GU negative Renal ROS  negative genitourinary   Musculoskeletal negative musculoskeletal ROS (+)    Abdominal   Peds  Hematology negative hematology ROS (+)   Anesthesia Other Findings Day of surgery medications reviewed with patient.  Reproductive/Obstetrics negative OB ROS                             Anesthesia Physical Anesthesia Plan  ASA: 3  Anesthesia Plan: General   Post-op Pain Management: Minimal or no pain anticipated   Induction: Intravenous  PONV Risk Score and Plan: 3 and Treatment may vary due to age or medical condition, Ondansetron, Dexamethasone and Midazolam  Airway Management Planned: Oral ETT  Additional Equipment: None  Intra-op Plan:   Post-operative Plan: Extubation in OR  Informed Consent: I have reviewed the patients History and Physical, chart, labs and discussed the procedure including the risks, benefits and alternatives for the proposed anesthesia with the patient or authorized representative who has indicated his/her understanding and acceptance.     Dental advisory given  Plan Discussed with: CRNA  Anesthesia Plan Comments:        Anesthesia Quick Evaluation

## 2023-12-05 ENCOUNTER — Encounter: Payer: Self-pay | Admitting: Cardiology

## 2023-12-05 MED FILL — Fentanyl Citrate Preservative Free (PF) Inj 100 MCG/2ML: INTRAMUSCULAR | Qty: 2 | Status: AC

## 2023-12-31 ENCOUNTER — Ambulatory Visit
Admission: RE | Admit: 2023-12-31 | Discharge: 2023-12-31 | Disposition: A | Discharge status: Home / Self Care | Payer: BC Managed Care – PPO | Source: Ambulatory Visit | Attending: Physician Assistant | Admitting: Physician Assistant

## 2023-12-31 ENCOUNTER — Ambulatory Visit (HOSPITAL_COMMUNITY)
Admit: 2023-12-31 | Discharge: 2023-12-31 | Disposition: A | Discharge status: Home / Self Care | Payer: BC Managed Care – PPO | Source: Ambulatory Visit | Attending: Internal Medicine | Admitting: Internal Medicine

## 2023-12-31 ENCOUNTER — Other Ambulatory Visit: Payer: Self-pay

## 2023-12-31 VITALS — BP 122/76 | HR 49 | Ht 72.0 in | Wt 232.6 lb

## 2023-12-31 VITALS — BP 125/74 | HR 61 | Temp 97.7°F | Resp 16

## 2023-12-31 DIAGNOSIS — Z79899 Other long term (current) drug therapy: Secondary | ICD-10-CM | POA: Insufficient documentation

## 2023-12-31 DIAGNOSIS — R300 Dysuria: Secondary | ICD-10-CM | POA: Diagnosis not present

## 2023-12-31 DIAGNOSIS — I5022 Chronic systolic (congestive) heart failure: Secondary | ICD-10-CM | POA: Diagnosis not present

## 2023-12-31 DIAGNOSIS — Z5181 Encounter for therapeutic drug level monitoring: Secondary | ICD-10-CM | POA: Insufficient documentation

## 2023-12-31 DIAGNOSIS — Z7901 Long term (current) use of anticoagulants: Secondary | ICD-10-CM | POA: Insufficient documentation

## 2023-12-31 DIAGNOSIS — I4819 Other persistent atrial fibrillation: Secondary | ICD-10-CM | POA: Diagnosis not present

## 2023-12-31 DIAGNOSIS — I080 Rheumatic disorders of both mitral and aortic valves: Secondary | ICD-10-CM | POA: Insufficient documentation

## 2023-12-31 DIAGNOSIS — I4891 Unspecified atrial fibrillation: Secondary | ICD-10-CM | POA: Diagnosis not present

## 2023-12-31 DIAGNOSIS — N390 Urinary tract infection, site not specified: Secondary | ICD-10-CM | POA: Diagnosis not present

## 2023-12-31 LAB — POCT URINALYSIS DIP (MANUAL ENTRY)
Bilirubin, UA: NEGATIVE
Glucose, UA: 500 mg/dL — AB
Ketones, POC UA: NEGATIVE mg/dL
Nitrite, UA: NEGATIVE
Protein Ur, POC: NEGATIVE mg/dL
Spec Grav, UA: <=1.005 — AB (ref 1.010–1.025)
Urobilinogen, UA: 0.2 U/dL
pH, UA: 5.5 (ref 5.0–8.0)

## 2023-12-31 MED ORDER — PHENAZOPYRIDINE HCL 200 MG PO TABS: 200.0000 mg | ORAL_TABLET | Freq: Three times a day (TID) | ORAL | 0 refills | Status: DC

## 2023-12-31 MED ORDER — METOPROLOL SUCCINATE ER 100 MG PO TB24: ORAL_TABLET | ORAL | Status: DC

## 2023-12-31 MED ORDER — SULFAMETHOXAZOLE-TRIMETHOPRIM 800-160 MG PO TABS: 1.0000 | ORAL_TABLET | Freq: Two times a day (BID) | ORAL | 0 refills | Status: AC

## 2023-12-31 NOTE — Patient Instructions (Addendum)
 Change metoprolol to 100mg  in the morning and 50mg  in the evening.

## 2023-12-31 NOTE — ED Provider Notes (Signed)
 [facility]    CSN: [id number] Arrival date & time: 12/31/23  1848      History   Chief Complaint Chief Complaint  Patient presents with   Urinary Frequency    HPI [name] is a 63 y.o. female.   Patient presents today with a weeklong history of UTI symptoms including frequency, urgency, dysuria.  Denies any hematuria, abdominal pain, pelvic pain, fever, nausea, vomiting.  She denies history of recurrent UTI.  Denies history of nephrolithiasis.  Does report that she had a cardiac ablation the end of January 2025 but has not had any additional surgical procedure that would have required a catheter placed.  She denies any recent antibiotics in the past 9 days.  Denies history of diabetes and does not take SGLT2 inhibitor.  Denies any pelvic pain or vaginal symptoms.  She has not tried any over-the-counter medications for symptom management.    Past Medical History:  Diagnosis Date   Allergy    Anxiety    Barrett esophagus 2008   Breast cancer [facility])    Breast cancer of lower-outer quadrant of left female breast (HCC) 03/15/2015   Cough 08/03/2012   nonproductive   Distal radius fracture, left 12/05/2012   Diverticulosis of colon (without mention of hemorrhage)    Esophageal reflux    Hiatal hernia 2013   large   Melanoma (HCC)    removed from betwen shoulder blades   Personal history of radiation therapy 2016   Recurrent UTI    Stricture and stenosis of esophagus 2013    Patient Active Problem List   Diagnosis Date Noted   Encounter for monitoring amiodarone therapy 12/31/2023   Mitral regurgitation 03/09/2022   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   Persistent atrial fibrillation (HCC) 10/07/2021   Rectal bleeding 04/21/2016   Family history of malignant neoplasm of breast 03/18/2015   Melanoma of skin (HCC) 03/18/2015   Breast cancer of lower-outer quadrant of left female breast (HCC) 03/15/2015   Abdominal pain, epigastric  04/16/2014   History of Nissen fundoplication 04/16/2014   Routine health maintenance 05/04/2013   Distal radius fracture, left 12/05/2012   NECK PAIN 01/05/2010   ESOPHAGEAL STRICTURE 04/20/2008   BARRETTS ESOPHAGUS 04/20/2008   ALLERGIC RHINITIS 12/13/2007   GERD 12/13/2007   DIVERTICULOSIS, COLON 12/13/2007   MELANOMA, TRUNK, HX OF 12/13/2007   CHOLECYSTECTOMY, HX OF 07/16/2007    Past Surgical History:  Procedure Laterality Date   ATRIAL FIBRILLATION ABLATION N/A 07/25/2022   Procedure: ATRIAL FIBRILLATION ABLATION;  Surgeon: Lanier Prude, MD;  Location: [facility];  Service: Cardiovascular;  Laterality: N/A;   ATRIAL FIBRILLATION ABLATION N/A 12/03/2023   Procedure: ATRIAL FIBRILLATION ABLATION;  Surgeon: Lanier Prude, MD;  Location: [facility];  Service: Cardiovascular;  Laterality: N/A;   BREAST LUMPECTOMY Left 2016   BREAST LUMPECTOMY WITH RADIOACTIVE SEED AND SENTINEL LYMPH NODE BIOPSY Left 03/25/2015   Procedure: LEFT BREAST LUMPECTOMY WITH RADIOACTIVE SEED AND LEFT AXILLARY SENTINEL LYMPH NODE BIOPSY;  Surgeon: Ovidio Kin, MD;  Location: [facility];  Service: General;  Laterality: Left;   CARDIOVERSION N/A 10/28/2021   Procedure: CARDIOVERSION;  Surgeon: Christell Constant, MD;  Location: [facility];  Service: Cardiovascular;  Laterality: N/A;   CESAREAN SECTION  1992   CHOLECYSTECTOMY     COLONOSCOPY  03/25/2007   Diverticulosis    ESOPHAGOGASTRODUODENOSCOPY  04/10/2012   HH, and Stricture   LAPAROSCOPIC NISSEN FUNDOPLICATION  08/09/2012   Procedure:  LAPAROSCOPIC NISSEN FUNDOPLICATION;  Surgeon: Valarie Merino, MD;  Location: [facility];  Service: General;  Laterality: N/A;  HIATAL HERNIA REPAIR   MELANOMA EXCISION     back   OPEN REDUCTION INTERNAL FIXATION (ORIF) DISTAL RADIAL FRACTURE  12/05/2012   Procedure: OPEN REDUCTION INTERNAL FIXATION (ORIF) DISTAL RADIAL FRACTURE;  Surgeon: Eulas Post, MD;  Location: [facility];  Service: Orthopedics;  Laterality: Left;    OB History   No obstetric history on file.      Home Medications    Prior to Admission medications   Medication Sig Start Date End Date Taking? Authorizing Provider  phenazopyridine (PYRIDIUM) 200 MG tablet Take 1 tablet (200 mg total) by mouth 3 (three) times daily. 12/31/23  Yes Karyn Brull K, PA-C  sulfamethoxazole-trimethoprim (BACTRIM DS) 800-160 MG tablet Take 1 tablet by mouth 2 (two) times daily for 5 days. 12/31/23 01/05/24 Yes Tavarion Babington, Noberto Retort, PA-C  acetaminophen (TYLENOL) 500 MG tablet Take 1,000 mg by mouth as needed for moderate pain (pain score 4-6) or mild pain (pain score 1-3).    [provider]  amiodarone (PACERONE) 200 MG tablet TAKE 1 TABLET BY MOUTH EVERY DAY 12/03/23   Fenton, Clint R, PA  b complex vitamins capsule Take 1 capsule by mouth daily.    [provider]  Calcium Carbonate-Vitamin D (CALCIUM-VITAMIN D) 500-200 MG-UNIT per tablet Take 1 tablet by mouth 2 (two) times daily.    [provider]  Cholecalciferol (VITAMIN D3 MAXIMUM STRENGTH) 125 MCG (5000 UT) capsule Take 5,000 Units by mouth daily.    [provider]  ELIQUIS 5 MG TABS tablet TAKE 1 TABLET BY MOUTH TWICE A DAY 08/27/23   Lanier Prude, MD  empagliflozin (JARDIANCE) 10 MG TABS tablet TAKE 1 TABLET BY MOUTH DAILY BEFORE BREAKFAST. 09/28/23   Jake Bathe, MD  furosemide (LASIX) 20 MG tablet Take 1 tablet (20 mg total) by mouth daily as needed (swelling/wt gain). 11/15/23   Jake Bathe, MD  metoprolol succinate (TOPROL-XL) 100 MG 24 hr tablet Take 100mg  in the AM and 50mg  in the PM 12/31/23   Eustace Pen, PA-C  pantoprazole (PROTONIX) 40 MG tablet TAKE 1 TABLET (40 MG TOTAL) BY MOUTH TWICE A DAY BEFORE MEALS 10/24/23   Meryl Dare, MD  potassium chloride (KLOR-CON M10) 10 MEQ tablet Take 1 tablet (10 mEq total) by mouth daily as needed (take with as needed  furosemide). 11/15/23   Jake Bathe, MD    Family History Family History  Problem Relation Age of Onset   Hypertension Mother    Diabetes Mother    Heart disease Mother    Lung cancer Father    Other Father        father was adopted - no paternal family history information   Breast cancer Sister    Cancer Sister 73       breast (half sister - same mother)   Diabetes Maternal Grandmother    Colon cancer Neg Hx    Esophageal cancer Neg Hx    Stomach cancer Neg Hx     Social History Social History   Tobacco Use   Smoking status: Never   Smokeless tobacco: Never   Tobacco comments:    Never smoke 02/15/22  Vaping Use   Vaping status: Never Used  Substance Use Topics   Alcohol use: Yes    Alcohol/week: 2.0 - 3.0 standard drinks of alcohol  Types: 2 - 3 Standard drinks or equivalent per week    Comment: weekends 4-6 10/07/21   Drug use: No     Allergies   Buprenex [buprenorphine] and Pseudoephedrine hcl   Review of Systems Review of Systems  Constitutional:  Positive for activity change. Negative for appetite change, fatigue and fever.  Gastrointestinal:  Negative for abdominal pain, diarrhea, nausea and vomiting.  Genitourinary:  Positive for dysuria, frequency and urgency. Negative for flank pain, hematuria, vaginal bleeding, vaginal discharge and vaginal pain.  Musculoskeletal:  Negative for arthralgias, back pain and myalgias.     Physical Exam Triage Vital Signs ED Triage Vitals  Encounter Vitals Group     BP 12/31/23 1909 125/74     Systolic BP Percentile --      Diastolic BP Percentile --      Pulse Rate 12/31/23 1909 61     Resp 12/31/23 1909 16     Temp 12/31/23 1909 97.7 F (36.5 C)     Temp Source 12/31/23 1909 Oral     SpO2 12/31/23 1909 100 %     Weight --      Height --      Head Circumference --      Peak Flow --      Pain Score 12/31/23 1912 8     Pain Loc --      Pain Education --      Exclude from Growth Chart --    No data found.  Updated Vital Signs BP  125/74   Pulse 61   Temp 97.7 F (36.5 C) (Oral)   Resp 16   SpO2 100%   Visual Acuity Right Eye Distance:   Left Eye Distance:   Bilateral Distance:    Right Eye Near:   Left Eye Near:    Bilateral Near:     Physical Exam Vitals reviewed.  Constitutional:      General: She is awake. She is not in acute distress.    Appearance: Normal appearance. She is well-developed. She is not ill-appearing.     Comments: Very pleasant female appears stated age in no acute distress sitting comfortably in exam room  HENT:     Head: Normocephalic and atraumatic.  Cardiovascular:     Rate and Rhythm: Normal rate and regular rhythm.     Heart sounds: Normal heart sounds, S1 normal and S2 normal. No murmur heard. Pulmonary:     Effort: Pulmonary effort is normal.     Breath sounds: Normal breath sounds. No wheezing, rhonchi or rales.     Comments: Clear to auscultation bilaterally Abdominal:     General: Bowel sounds are normal.     Palpations: Abdomen is soft.     Tenderness: There is no abdominal tenderness. There is no right CVA tenderness, left CVA tenderness, guarding or rebound.     Comments: Benign abdominal exam  Psychiatric:        Behavior: Behavior is cooperative.      UC Treatments / Results  Labs (all labs ordered are listed, but only abnormal results are displayed) Labs Reviewed  POCT URINALYSIS DIP (MANUAL ENTRY) - Abnormal; Notable for the following components:      Result Value   Clarity, UA cloudy (*)    Glucose, UA =500 (*)    Spec Grav, UA <=1.005 (*)    Blood, UA small (*)    Leukocytes, UA Small (1+) (*)    All other components within normal limits  URINE CULTURE  EKG   Radiology No results found.  Procedures Procedures (including critical care time)  Medications Ordered in UC Medications - No data to display  Initial Impression / Assessment and Plan / UC Course  I have reviewed the triage vital signs and the nursing notes.  Pertinent labs  & imaging results that were available during my care of the patient were reviewed by me and considered in my medical decision making (see chart for details).     Patient is well-appearing, afebrile, nontoxic, nontachycardic.  UA consistent with UTI.  Will treat with Bactrim DS twice daily for 5 days.  No indication for dose adjustment based on metabolic panel from 11/05/2023 with a creatinine of 0.73 and calculated creatinine clearance of 131.04 mL/min.  She was also given Pyridium to help with discomfort.  Recommended she push fluids.  She is to rest and drink plenty fluid.  Urine culture was obtained and we will contact her if we need to discontinue or change her antibiotics based on susceptibilities identified on culture.  We discussed that if her symptoms are not improving within a few days or if anything worsens and she has abdominal pain, pelvic pain, fever, nausea, vomiting, flank pain she needs to be seen emergently.  Strict return precautions given.  Final Clinical Impressions(s) / UC Diagnoses   Final diagnoses:  Acute UTI  Dysuria     Discharge Instructions      We are treating you for urinary tract infection.  Start sulfamethoxazole trimethoprim twice daily for 5 days.  If you develop any rash or oral lesions stop the medication to be seen immediately.  Use Pyridium to help with your discomfort.  This will change the color of your urine.  Push fluids.  We will contact you if we need to change or stop your antibiotic based on her culture results.  If anything changes and you have abdominal pain, pelvic pain, fever, nausea, vomiting you need to be seen immediately.    ED Prescriptions     Medication Sig Dispense Auth. Provider   sulfamethoxazole-trimethoprim (BACTRIM DS) 800-160 MG tablet Take 1 tablet by mouth 2 (two) times daily for 5 days. 10 tablet Shanin Szymanowski K, PA-C   phenazopyridine (PYRIDIUM) 200 MG tablet Take 1 tablet (200 mg total) by mouth 3 (three) times daily. 6  tablet Chasey Dull, Noberto Retort, PA-C      PDMP not reviewed this encounter.   Jeani Hawking, PA-C 12/31/23 1941

## 2023-12-31 NOTE — Discharge Instructions (Signed)
 We are treating you for urinary tract infection.  Start sulfamethoxazole trimethoprim twice daily for 5 days.  If you develop any rash or oral lesions stop the medication to be seen immediately.  Use Pyridium to help with your discomfort.  This will change the color of your urine.  Push fluids.  We will contact you if we need to change or stop your antibiotic based on her culture results.  If anything changes and you have abdominal pain, pelvic pain, fever, nausea, vomiting you need to be seen immediately.

## 2023-12-31 NOTE — ED Triage Notes (Signed)
 Painful urination x 1 week. No hematuria. No fever. No otc meds.

## 2023-12-31 NOTE — Progress Notes (Signed)
 Primary Care Physician: Melida Quitter, MD Primary Cardiologist: Dr Anne Fu Primary Electrophysiologist: Dr Lalla Brothers Referring Physician: Dr Luevenia Maxin Wanda Moore is a 63 y.o. female with a history of new onset atrial fibrillation who presents for follow up in the [facility]. The patient was initially diagnosed with atrial fibrillation 10/05/21 after presenting to her PCP with symptoms of SOB. ECG showed afib with rapid rates. She was started on metoprolol for rate control. Patient has a CHADS2VASC score of 1. She was sent by her PCP for a CT to rule out PE which was negative. It did show small pleural effusions and peribrochovascular ground glass in lower lobes, consistent with CHF. She reports that she had been fatigued for two months leading up to this diagnosis. She also reported orthopnea.   Patient is s/p DCCV on 10/28/21. Unfortunately, she was back in afib 11/02/21 with heart racing and fatigue. Her BB was increased. Echo showed EF 30-35%, moderate-severe MR. She was started on amiodarone with plan to recheck echo once in SR. Patient reports that she converted to SR 11/10/21, confirmed on her smart watch. Patient had an echo which showed improvement in her EF, up to 45-50%, MR improved in SR.  Patient is s/p afib ablation with Dr Lalla Brothers on 07/25/22. Her amiodarone was discontinued.   Patient has now had several episodes of symptomatic paroxysmal afib over the past several weeks. Her amiodarone was resumed as a bridge to ablation.  On follow up today, patient reports that she had been in SR for several days but reverted to afib earlier this AM. There were no specific triggers that she could identify. She feels poorly in afib with SOB, heart racing, and intermittent dizziness.   On follow up 12/31/23, patient is currently in NSR. S/p Afib ablation on 12/03/23 by Dr. Lalla Brothers. No episodes of Afib since ablation. She is currently taking amiodarone 200 mg daily.  No chest pain or SOB. She overall feels much better in normal rhythm; she feels tired with lower HR. Leg sites healed without issue. No missed doses of Eliquis 5 mg BID.   Today, she denies symptoms of orthopnea, PND, lower extremity edema, dizziness, presyncope, syncope, snoring, daytime somnolence, bleeding, or neurologic sequela. The patient is tolerating medications without difficulties and is otherwise without complaint today.    Atrial Fibrillation Risk Factors:  she does not have symptoms or diagnosis of sleep apnea. she does not have a history of rheumatic fever. she does not have a history of alcohol use. The patient does not have a history of early familial atrial fibrillation or other arrhythmias.   Atrial Fibrillation Management history:  Previous antiarrhythmic drugs: amiodarone  Previous cardioversions: 10/28/21 Previous ablations: 07/25/22, 12/03/23 Anticoagulation history: Eliquis   Past Medical History:  Diagnosis Date   Allergy    Anxiety    Barrett esophagus 2008   Breast cancer (HCC)    Breast cancer of lower-outer quadrant of left female breast (HCC) 03/15/2015   Cough 08/03/2012   nonproductive   Distal radius fracture, left 12/05/2012   Diverticulosis of colon (without mention of hemorrhage)    Esophageal reflux    Hiatal hernia 2013   large   Melanoma (HCC)    removed from betwen shoulder blades   Personal history of radiation therapy 2016   Recurrent UTI    Stricture and stenosis of esophagus 2013    Current Outpatient Medications  Medication Sig Dispense Refill   acetaminophen (TYLENOL) 500 MG  tablet Take 1,000 mg by mouth as needed for moderate pain (pain score 4-6) or mild pain (pain score 1-3).     amiodarone (PACERONE) 200 MG tablet TAKE 1 TABLET BY MOUTH EVERY DAY 90 tablet 0   b complex vitamins capsule Take 1 capsule by mouth daily.     Calcium Carbonate-Vitamin D (CALCIUM-VITAMIN D) 500-200 MG-UNIT per tablet Take 1 tablet by mouth 2  (two) times daily.     Cholecalciferol (VITAMIN D3 MAXIMUM STRENGTH) 125 MCG (5000 UT) capsule Take 5,000 Units by mouth daily.     ELIQUIS 5 MG TABS tablet TAKE 1 TABLET BY MOUTH TWICE A DAY 60 tablet 5   empagliflozin (JARDIANCE) 10 MG TABS tablet TAKE 1 TABLET BY MOUTH DAILY BEFORE BREAKFAST. 90 tablet 3   furosemide (LASIX) 20 MG tablet Take 1 tablet (20 mg total) by mouth daily as needed (swelling/wt gain). 90 tablet 1   pantoprazole (PROTONIX) 40 MG tablet TAKE 1 TABLET (40 MG TOTAL) BY MOUTH TWICE A DAY BEFORE MEALS 180 tablet 0   potassium chloride (KLOR-CON M10) 10 MEQ tablet Take 1 tablet (10 mEq total) by mouth daily as needed (take with as needed  furosemide). 90 tablet 1   metoprolol succinate (TOPROL-XL) 100 MG 24 hr tablet Take 100mg  in the AM and 50mg  in the PM     No current facility-administered medications for this encounter.    ROS- All systems are reviewed and negative except as per the HPI above.  Physical Exam: Vitals:   12/31/23 1135  BP: 122/76  Pulse: (!) 49  Weight: 105.5 kg  Height: 6' (1.829 m)    GEN- The patient is well appearing, alert and oriented x 3 today.   Neck - no JVD or carotid bruit noted Lungs- Clear to ausculation bilaterally, normal work of breathing Heart- Regular bradycardic rate and rhythm, no murmurs, rubs or gallops, PMI not laterally displaced Extremities- no clubbing, cyanosis, or edema Skin - no rash or ecchymosis noted   Wt Readings from Last 3 Encounters:  12/31/23 105.5 kg  12/03/23 104.3 kg  10/25/23 105.2 kg    EKG today demonstrates  Vent. rate 49 BPM PR interval 174 ms QRS duration 84 ms QT/QTcB 508/458 ms P-R-T axes -16 6 60 Sinus bradycardia Cannot rule out Anterior infarct , age undetermined Abnormal ECG When compared with ECG of 03-Dec-2023 09:39, PREVIOUS ECG IS PRESENT   Echo 09/11/23: 1. Left ventricular ejection fraction, by estimation, is 55 to 60%. Left  ventricular ejection fraction by 3D volume  is 56 %. The left ventricle has  normal function. The left ventricle has no regional wall motion  abnormalities. There is mild concentric  left ventricular hypertrophy. Left ventricular diastolic parameters are  consistent with Grade II diastolic dysfunction (pseudonormalization). The  average left ventricular global longitudinal strain is -19.2 %. The global  longitudinal strain is normal.   2. Right ventricular systolic function is normal. The right ventricular  size is mildly enlarged.   3. 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 Evidence of atrial level shunting  detected by color flow Doppler.   4. The mitral valve is normal in structure. Trivial mitral valve  regurgitation. No evidence of mitral stenosis.   5. The aortic valve is tricuspid. Aortic valve regurgitation is trivial.  No aortic stenosis is present.   6. Aortic dilatation noted. There is mild dilatation of the ascending  aorta, measuring 40 mm.   7.  The inferior vena cava is normal in size with greater than 50%  respiratory variability, suggesting right atrial pressure of 3 mmHg.    Epic records are reviewed at length today  CHA2DS2-VASc Score = 2  The patient's score is based upon: CHF History: 1 HTN History: 0 Diabetes History: 0 Stroke History: 0 Vascular Disease History: 0 Age Score: 0 Gender Score: 1       ASSESSMENT AND PLAN: Persistent Atrial Fibrillation (ICD10:  I48.19) The patient's CHA2DS2-VASc score is 2, indicating a 2.2% annual risk of stroke.   S/p afib ablation 07/25/22. S/p Afib ablation on 12/03/23 by Dr. Lalla Brothers.  She is currently in NSR. Due to bradycardia, will lower Toprol to 100 mg AM, 50 mg PM. Monitor HR and BP at home.  High risk medication monitoring (ICD10: R7229428) Patient requires ongoing monitoring for anti-arrhythmic medication which has the potential to cause life threatening arrhythmias or AV block. Qtc  stable. Continue amiodarone 200 mg daily.   Valvular heart disease  Mitral and aortic valve both have trivial regurgitation.   HFrEF EF improved to 55-60%.   Follow up as scheduled with EP.    Justin Mend, PA-C Afib Clinic [facility] [address] [place], [place] [address] [phone] 12/31/2023 1:55 PM

## 2024-01-03 LAB — URINE CULTURE: Culture: 70000 — AB

## 2024-01-07 ENCOUNTER — Telehealth: Payer: Self-pay

## 2024-01-07 ENCOUNTER — Encounter: Payer: Self-pay | Admitting: Cardiology

## 2024-01-07 ENCOUNTER — Other Ambulatory Visit (HOSPITAL_COMMUNITY): Payer: Self-pay

## 2024-01-07 NOTE — Telephone Encounter (Signed)
 Pharmacy Patient Advocate Encounter   Received notification from Physician's Office regarding affordability for JARDIANCE is required/requested.   Insurance verification completed.   The patient is insured through [facility] .   Per test claim:  FARXIGA AND JARDIANCE BOTH HAVE A COPAY OF $40 FOR A 30 DAY SUPPLY  If patient picks up Jardiance fill from 01/04/24, they will meet the deductible. This is a commercial plan so patient doesn't qualify for PAP, must use copay card.

## 2024-01-08 NOTE — Telephone Encounter (Signed)
 See separate encounter

## 2024-01-23 DIAGNOSIS — E559 Vitamin D deficiency, unspecified: Secondary | ICD-10-CM | POA: Diagnosis not present

## 2024-01-23 DIAGNOSIS — I4891 Unspecified atrial fibrillation: Secondary | ICD-10-CM | POA: Diagnosis not present

## 2024-01-30 DIAGNOSIS — E538 Deficiency of other specified B group vitamins: Secondary | ICD-10-CM | POA: Diagnosis not present

## 2024-01-30 DIAGNOSIS — Z0000 Encounter for general adult medical examination without abnormal findings: Secondary | ICD-10-CM | POA: Diagnosis not present

## 2024-01-30 DIAGNOSIS — I5022 Chronic systolic (congestive) heart failure: Secondary | ICD-10-CM | POA: Diagnosis not present

## 2024-01-30 DIAGNOSIS — Z1331 Encounter for screening for depression: Secondary | ICD-10-CM | POA: Diagnosis not present

## 2024-01-30 DIAGNOSIS — Z1339 Encounter for screening examination for other mental health and behavioral disorders: Secondary | ICD-10-CM | POA: Diagnosis not present

## 2024-02-06 DIAGNOSIS — M1712 Unilateral primary osteoarthritis, left knee: Secondary | ICD-10-CM | POA: Diagnosis not present

## 2024-02-09 ENCOUNTER — Encounter: Payer: Self-pay | Admitting: Cardiology

## 2024-02-18 DIAGNOSIS — S60221A Contusion of right hand, initial encounter: Secondary | ICD-10-CM | POA: Diagnosis not present

## 2024-02-20 DIAGNOSIS — Z01419 Encounter for gynecological examination (general) (routine) without abnormal findings: Secondary | ICD-10-CM | POA: Diagnosis not present

## 2024-02-22 NOTE — Progress Notes (Signed)
 Electrophysiology Office Note:   Date:  03/03/2024  ID:  Yui Kipnis, DOB [birth year], MRN [id number]  Primary Cardiologist: Dorothye Gathers, MD Primary Heart Failure: None Electrophysiologist: Boyce Byes, MD      History of Present Illness:   Wanda Moore is a 63 y.o. female with h/o AF, HFrEF seen today for routine electrophysiology followup.   Since last being seen in our clinic the patient reports doing very well post ablation. She has not had any further episodes of AF. She monitors with an Apple watch.  She does get "winded" going up steps but nothing like what she experienced when she was in AF. No missed doses of OAC.   She denies chest pain, palpitations, dyspnea, PND, orthopnea, nausea, vomiting, dizziness, syncope, edema, weight gain, or early satiety.   Review of systems complete and found to be negative unless listed in HPI.   EP Information / Studies Reviewed:    EKG is ordered today. Personal review as below.  EKG Interpretation Date/Time:  Monday March 03 2024 08:52:34 EDT Ventricular Rate:  52 PR Interval:  178 QRS Duration:  86 QT Interval:  486 QTC Calculation: 451 R Axis:   11  Text Interpretation: Sinus bradycardia Nonspecific ST abnormality Confirmed by Creighton Doffing ([id number]) on 03/03/2024 8:57:55 AM   Studies:  EPS 07/25/2022 > successful PVI, ablation / isolation of the posterior wall ECHO 09/11/2023 > LVEF 55-60%, GIIDD CT Cardiac Morphology 11/09/2023 > normal pulmonary vein drainage into the LA, no PFO/ASD, normal coronary origin, CAC score of 0 EPS 12/03/23 > successful redo PVI, successful redo ablation / isolation of the posterior wall  Arrhythmia / AAD AF > initial dx ~10/05/2021 DCCV 10/28/2021 Amiodarone  > pre ablation, stopped post    Risk Assessment/Calculations:    CHA2DS2-VASc Score = 2   This indicates a 2.2% annual risk of stroke. The patient's score is based upon: CHF History: 1 HTN History: 0 Diabetes History:  0 Stroke History: 0 Vascular Disease History: 0 Age Score: 0 Gender Score: 1              Physical Exam:   VS:  BP 124/80 (BP Location: Right Arm, Patient Position: Sitting, Cuff Size: Normal)   Pulse (!) 52   Ht 6' (1.829 m)   Wt 239 lb (108.4 kg)   BMI 32.41 kg/m    Wt Readings from Last 3 Encounters:  03/03/24 239 lb (108.4 kg)  12/31/23 232 lb 9.6 oz (105.5 kg)  12/03/23 230 lb (104.3 kg)     GEN: Well nourished, well developed in no acute distress NECK: No JVD; No carotid bruits CARDIAC: Regular rate and rhythm, no murmurs, rubs, gallops RESPIRATORY:  Clear to auscultation without rales, wheezing or rhonchi  ABDOMEN: Soft, non-tender, non-distended EXTREMITIES:  No edema; No deformity   ASSESSMENT AND PLAN:    Persistent Atrial Fibrillation  CHA2DS2-VASc 2, s/p ablation 07/2022, redo in 11/2023  -continue OAC for stroke prophylaxis  -Toprol  100mg  AM, 50 mg PM  -no symptom burden since ablation   -stop amiodarone  and monitor for further symptoms -monitors with an Apple Watch -EKG with NSR, stable intervals  Secondary Hypercoagulable State  -continue Eliquis  5mg  BID, dose reviewed and appropriate by age / wt  Valvular Heart Disease  Trivial mitral & aortic regurgitation -monitor / per Cardiology   HFrEF -GDMT per Cardiology   Follow up with Dr. Marven Slimmer in 6 months  Signed, Creighton Doffing, NP-C, AGACNP-BC [facility] - Electrophysiology  03/03/2024, 9:53  AM

## 2024-03-01 ENCOUNTER — Other Ambulatory Visit: Payer: Self-pay | Admitting: Cardiology

## 2024-03-01 DIAGNOSIS — I4819 Other persistent atrial fibrillation: Secondary | ICD-10-CM

## 2024-03-03 ENCOUNTER — Ambulatory Visit: Payer: BC Managed Care – PPO | Attending: Pulmonary Disease | Admitting: Pulmonary Disease

## 2024-03-03 ENCOUNTER — Encounter: Payer: Self-pay | Admitting: Pulmonary Disease

## 2024-03-03 VITALS — BP 124/80 | HR 52 | Ht 72.0 in | Wt 239.0 lb

## 2024-03-03 DIAGNOSIS — I428 Other cardiomyopathies: Secondary | ICD-10-CM | POA: Diagnosis not present

## 2024-03-03 DIAGNOSIS — D6869 Other thrombophilia: Secondary | ICD-10-CM

## 2024-03-03 DIAGNOSIS — I4819 Other persistent atrial fibrillation: Secondary | ICD-10-CM | POA: Diagnosis not present

## 2024-03-03 NOTE — Patient Instructions (Signed)
 Medication Instructions:  Stop amiodarone  *If you need a refill on your cardiac medications before your next appointment, please call your pharmacy*  Lab Work: None ordered If you have labs (blood work) drawn today and your tests are completely normal, you will receive your results only by: MyChart Message (if you have MyChart) OR A paper copy in the mail If you have any lab test that is abnormal or we need to change your treatment, we will call you to review the results.  Follow-Up: At [facility], you and your health needs are our priority.  As part of our continuing mission to provide you with exceptional heart care, our providers are all part of one team.  This team includes your primary Cardiologist (physician) and Advanced Practice Providers or APPs (Physician Assistants and Nurse Practitioners) who all work together to provide you with the care you need, when you need it.  Your next appointment:   6 month(s)  Provider:   You may see Boyce Byes, MD or one of the following Advanced Practice Providers on your designated Care Team:   Mertha Abrahams, [place] Joycelyn Noa" [place], [place] Creighton Doffing, NP

## 2024-03-03 NOTE — Telephone Encounter (Signed)
 Prescription refill request for Eliquis  received. Indication: AF Last office visit: 12/31/23  Jamas Maywood PA-C Scr: 0.73 on 11/05/23  Epic Age: 63 Weight: 105.5kg  Based on above findings Eliquis  5mg  twice daily is the appropriate dose.  Refill approved.

## 2024-03-05 ENCOUNTER — Other Ambulatory Visit (HOSPITAL_COMMUNITY): Payer: Self-pay | Admitting: Physician Assistant

## 2024-03-05 DIAGNOSIS — S60221D Contusion of right hand, subsequent encounter: Secondary | ICD-10-CM | POA: Diagnosis not present

## 2024-03-11 DIAGNOSIS — D225 Melanocytic nevi of trunk: Secondary | ICD-10-CM | POA: Diagnosis not present

## 2024-03-11 DIAGNOSIS — L820 Inflamed seborrheic keratosis: Secondary | ICD-10-CM | POA: Diagnosis not present

## 2024-03-11 DIAGNOSIS — Z08 Encounter for follow-up examination after completed treatment for malignant neoplasm: Secondary | ICD-10-CM | POA: Diagnosis not present

## 2024-03-11 DIAGNOSIS — D492 Neoplasm of unspecified behavior of bone, soft tissue, and skin: Secondary | ICD-10-CM | POA: Diagnosis not present

## 2024-03-11 DIAGNOSIS — L821 Other seborrheic keratosis: Secondary | ICD-10-CM | POA: Diagnosis not present

## 2024-04-17 DIAGNOSIS — D492 Neoplasm of unspecified behavior of bone, soft tissue, and skin: Secondary | ICD-10-CM | POA: Diagnosis not present

## 2024-04-17 DIAGNOSIS — L942 Calcinosis cutis: Secondary | ICD-10-CM | POA: Diagnosis not present

## 2024-05-20 ENCOUNTER — Other Ambulatory Visit: Payer: Self-pay

## 2024-05-20 DIAGNOSIS — R1310 Dysphagia, unspecified: Secondary | ICD-10-CM

## 2024-05-20 MED ORDER — PANTOPRAZOLE SODIUM 40 MG PO TBEC: DELAYED_RELEASE_TABLET | ORAL | 0 refills | Status: DC

## 2024-05-26 ENCOUNTER — Ambulatory Visit (INDEPENDENT_AMBULATORY_CARE_PROVIDER_SITE_OTHER): Admitting: Gastroenterology

## 2024-05-26 ENCOUNTER — Encounter: Payer: Self-pay | Admitting: Gastroenterology

## 2024-05-26 VITALS — BP 118/80 | HR 57 | Ht 72.0 in | Wt 249.0 lb

## 2024-05-26 DIAGNOSIS — K2100 Gastro-esophageal reflux disease with esophagitis, without bleeding: Secondary | ICD-10-CM | POA: Diagnosis not present

## 2024-05-26 DIAGNOSIS — R1310 Dysphagia, unspecified: Secondary | ICD-10-CM | POA: Insufficient documentation

## 2024-05-26 DIAGNOSIS — R1319 Other dysphagia: Secondary | ICD-10-CM

## 2024-05-26 MED ORDER — PANTOPRAZOLE SODIUM 40 MG PO TBEC: DELAYED_RELEASE_TABLET | ORAL | 4 refills | Status: AC

## 2024-05-26 NOTE — Patient Instructions (Addendum)
 Continue Pantoprazole  40 mg twice daily Continue GERD diet, no late meals 3-4 hours before lying down  _______________________________________________________  If your blood pressure at your visit was 140/90 or greater, please contact your primary care physician to follow up on this.  _______________________________________________________  If you are age 64 or older, your body mass index should be between 23-30. Your Body mass index is 33.77 kg/m. If this is out of the aforementioned range listed, please consider follow up with your Primary Care Provider.  If you are age 35 or younger, your body mass index should be between 19-25. Your Body mass index is 33.77 kg/m. If this is out of the aformentioned range listed, please consider follow up with your Primary Care Provider.   ________________________________________________________  The [facility] providers would like to encourage you to use MYCHART to communicate with providers for non-urgent requests or questions.  Due to long hold times on the telephone, sending your provider a message by [facility] may be a faster and more efficient way to get a response.  Please allow 48 business hours for a response.  Please remember that this is for non-urgent requests.  _______________________________________________________  Thank you for trusting me with your gastrointestinal care. Deanna May, NP-C

## 2024-05-26 NOTE — Progress Notes (Signed)
 Chief Complaint: follow-up, medication refills Primary GI Doctor:(previously Dr. Aneita) Dr. Albertus  HPI: Patient is a  63  year old female patient with past medical history of GERD and esophagitis grade A, S/P Nissen with loose wrap, small HH , Afib (on Eliquis ) who presents for follow-up and medication refills.  Last seen in GI office by Dr. Aneita on 09/27/23 for yearly follow-up.  Interval History     Patient presents for follow-up of GERD and medication refill. Patient has history of GERD and taking Pantoprazole  40 mg twice daily. She ran out of medication for four days and symptoms of GERD was severe.      She has continued with intermittent dysphagia with chicken, rice and bread. She reports she is mindful of how fast she is eating and limits how much she eats those particular foods.  No other GI issues. No diarrhea or constipation. No blood in stool.  She had cardiac ablation in January and continues on Eliquis . Off amiodarone ,   GI procedures: EGD Sept 2022 - LA Grade A reflux esophagitis with no bleeding.  - Z-line variable, 40 cm from the incisors. Biopsied.  - No endoscopic esophageal abnormality to explain patient's dysphagia. Esophagus dilated.  - Small hiatal hernia.  - Prior fundoplication.  - A few gastric polyps. Biopsied.  - A single mucosal papule (nodule) found in the stomach. Biopsied.  - Normal duodenal bulb and second portion of the duodenum.   1. Surgical [P], gastric antrum - GASTRIC ANTRAL MUCOSA WITH NONSPECIFIC REACTIVE GASTROPATHY - NEGATIVE FOR INTESTINAL METAPLASIA OR DYSPLASIA - WARTHIN STARRY STAIN IS NEGATIVE FOR HELICOBACTER PYLORI 2. Surgical [P], gastric body - FUNDIC GLAND POLYP(S) - GASTRIC OXYNTIC MUCOSA WITH NO SPECIFIC HISTOPATHOLOGIC CHANGES - WARTHIN STARRY STAIN IS NEGATIVE FOR HELICOBACTER PYLORI 3. Surgical [P], z line bx's - ESOPHAGEAL SQUAMOUS AND CARDIAC MUCOSA WITH MILD CHRONIC NONSPECIFIC CARDITIS - NEGATIVE FOR INTESTINAL  METAPLASIA OR DYSPLASIA     BA esophagram Sept 2022 1. Fold thickening in the mid and distal esophagus suggesting esophagitis. No ulceration or stricture is currently identified. 2. Small recurrent type 1 hiatal hernia. Partial unwrapping of the fundoplication. 3. On initial swallowing there was a small amount of tracheal aspiration, without eliciting a cough response. This Nanie Dunkleberger have simply been incidental given the small amount, although the patient were to experience otherwise unexplained pneumonia then further workup for aspiration Disaya Walt be warranted. 4. Disruption of 1 out of 4 primary peristaltic waves in the distal esophagus  04/2016 colonoscopy, recall 10 years  - One 5 mm polyp in the sigmoid colon, removed with a cold snare. Resected and retrieved. - Internal hemorrhoids. - Diverticulosis in the sigmoid colon and in the descending colon. Path:  Diagnosis Surgical [P], sigmoid, polyp -BENIGN POLYPOID COLONIC MUCOSA. -NO ADENOMATOUS CHANGE OR MALIGNANCY IDENTIFIED.  Wt Readings from Last 3 Encounters:  05/26/24 249 lb (112.9 kg)  03/03/24 239 lb (108.4 kg)  12/31/23 232 lb 9.6 oz (105.5 kg)      Past Medical History:  Diagnosis Date   Allergy    Anxiety    Barrett esophagus 2008   Breast cancer (HCC)    Breast cancer of lower-outer quadrant of left female breast (HCC) 03/15/2015   Cough 08/03/2012   nonproductive   Distal radius fracture, left 12/05/2012   Diverticulosis of colon (without mention of hemorrhage)    Esophageal reflux    Hiatal hernia 2013   large   Melanoma (HCC)    removed from betwen shoulder  blades   Personal history of radiation therapy 2016   Recurrent UTI    Stricture and stenosis of esophagus 2013    Past Surgical History:  Procedure Laterality Date   ATRIAL FIBRILLATION ABLATION N/A 07/25/2022   Procedure: ATRIAL FIBRILLATION ABLATION;  Surgeon: Cindie Ole DASEN, MD;  [facility];  Service: Cardiovascular;  Laterality: N/A;    ATRIAL FIBRILLATION ABLATION N/A 12/03/2023   Procedure: ATRIAL FIBRILLATION ABLATION;  Surgeon: Cindie Ole DASEN, MD;  [facility];  Service: Cardiovascular;  Laterality: N/A;   BREAST LUMPECTOMY Left 2016   BREAST LUMPECTOMY WITH RADIOACTIVE SEED AND SENTINEL LYMPH NODE BIOPSY Left 03/25/2015   Procedure: LEFT BREAST LUMPECTOMY WITH RADIOACTIVE SEED AND LEFT AXILLARY SENTINEL LYMPH NODE BIOPSY;  Surgeon: Alm Angle, MD;  Location: [facility];  Service: General;  Laterality: Left;   CARDIOVERSION N/A 10/28/2021   Procedure: CARDIOVERSION;  Surgeon: Santo Stanly LABOR, MD;  [facility]: [facility];  Service: Cardiovascular;  Laterality: N/A;   CESAREAN SECTION  1992   CHOLECYSTECTOMY     COLONOSCOPY  03/25/2007   Diverticulosis    ESOPHAGOGASTRODUODENOSCOPY  04/10/2012   HH, and Stricture   LAPAROSCOPIC NISSEN FUNDOPLICATION  08/09/2012   Procedure: LAPAROSCOPIC NISSEN FUNDOPLICATION;  Surgeon: Donnice KATHEE Lunger, MD;  Location: [facility];  Service: General;  Laterality: N/A;  HIATAL HERNIA REPAIR   MELANOMA EXCISION     back   OPEN REDUCTION INTERNAL FIXATION (ORIF) DISTAL RADIAL FRACTURE  12/05/2012   Procedure: OPEN REDUCTION INTERNAL FIXATION (ORIF) DISTAL RADIAL FRACTURE;  Surgeon: Fonda SHAUNNA Olmsted, MD;  Location: [facility];  Service: Orthopedics;  Laterality: Left;    Current Outpatient Medications  Medication Sig Dispense Refill   acetaminophen  (TYLENOL ) 500 MG tablet Take 1,000 mg by mouth as needed for moderate pain (pain score 4-6) or mild pain (pain score 1-3).     b complex vitamins capsule Take 1 capsule by mouth daily.     Calcium Carbonate-Vitamin D (CALCIUM-VITAMIN D) 500-200 MG-UNIT per tablet Take 1 tablet by mouth 2 (two) times daily.     Cholecalciferol (VITAMIN D3 MAXIMUM STRENGTH) 125 MCG (5000 UT) capsule Take 5,000 Units by mouth daily.     ELIQUIS  5 MG TABS tablet TAKE 1 TABLET BY MOUTH TWICE A DAY 60 tablet 5    empagliflozin  (JARDIANCE ) 10 MG TABS tablet TAKE 1 TABLET BY MOUTH DAILY BEFORE BREAKFAST. 90 tablet 3   furosemide  (LASIX ) 20 MG tablet Take 1 tablet (20 mg total) by mouth daily as needed (swelling/wt gain). 90 tablet 1   metoprolol  succinate (TOPROL -XL) 100 MG 24 hr tablet Take 100mg  in the AM and 50mg  in the PM     pantoprazole  (PROTONIX ) 40 MG tablet TAKE 1 TABLET (40 MG TOTAL) BY MOUTH TWICE A DAY BEFORE MEALS 180 tablet 0   potassium chloride  (KLOR-CON  M10) 10 MEQ tablet Take 1 tablet (10 mEq total) by mouth daily as needed (take with as needed  furosemide ). 90 tablet 1   No current facility-administered medications for this visit.    Allergies as of 05/26/2024 - Review Complete 05/26/2024  Allergen Reaction Noted   Buprenex [buprenorphine] Itching 08/09/2012   Pseudoephedrine  hcl Itching 12/18/2018    Family History  Problem Relation Age of Onset   Hypertension Mother    Diabetes Mother    Heart disease Mother    Lung cancer Father    Other Father        father was adopted -  no paternal family history information   Breast cancer Sister    Cancer Sister 33       breast (half sister - same mother)   Diabetes Maternal Grandmother    Colon cancer Neg Hx    Esophageal cancer Neg Hx    Stomach cancer Neg Hx     Review of Systems:    Constitutional: No weight loss, fever, chills, weakness or fatigue HEENT: Eyes: No change in vision               Ears, Nose, Throat:  No change in hearing or congestion Skin: No rash or itching Cardiovascular: No chest pain, chest pressure or palpitations   Respiratory: No SOB or cough Gastrointestinal: See HPI and otherwise negative Genitourinary: No dysuria or change in urinary frequency Neurological: No headache, dizziness or syncope Musculoskeletal: No new muscle or joint pain Hematologic: No bleeding or bruising Psychiatric: No history of depression or anxiety    Physical Exam:  Vital signs: BP 118/80   Pulse (!) 57   Ht 6'  (1.829 m)   Wt 249 lb (112.9 kg)   BMI 33.77 kg/m   Constitutional:   Pleasant  female appears to be in NAD, Well developed, Well nourished, alert and cooperative Throat: Oral cavity and pharynx without inflammation, swelling or lesion.  Respiratory: Respirations even and unlabored. Lungs clear to auscultation bilaterally.   No wheezes, crackles, or rhonchi.  Cardiovascular: Normal S1, S2. Regular rate and rhythm. No peripheral edema, cyanosis or pallor.  Gastrointestinal:  Soft, nondistended, nontender. No rebound or guarding. Normal bowel sounds. No appreciable masses or hepatomegaly. Rectal:  Not performed.  Msk:  Symmetrical without gross deformities. Without edema, no deformity or joint abnormality.  Neurologic:  Alert and  oriented x4;  grossly normal neurologically.  Skin:   Dry and intact without significant lesions or rashes. Psychiatric: Oriented to person, place and time. Demonstrates good judgement and reason without abnormal affect or behaviors.  RELEVANT LABS AND IMAGING: CBC    Latest Ref Rng & Units 11/05/2023   11:28 AM 09/03/2023    3:02 PM 04/12/2023    3:39 PM  CBC  WBC 3.4 - 10.8 x10E3/uL 7.7  6.0  10.5   Hemoglobin 11.1 - 15.9 g/dL 86.2  87.4  87.8   Hematocrit 34.0 - 46.6 % 41.2  38.9  36.9   Platelets 150 - 450 x10E3/uL 265  243  301      CMP     Latest Ref Rng & Units 11/05/2023   11:28 AM 09/03/2023    3:02 PM 04/12/2023    3:39 PM  CMP  Glucose 70 - 99 mg/dL 76  897  882   BUN 8 - 27 mg/dL 11  11  15    Creatinine 0.57 - 1.00 mg/dL 9.26  9.47  9.21   Sodium 134 - 144 mmol/L 144  140  138   Potassium 3.5 - 5.2 mmol/L 4.7  4.1  3.9   Chloride 96 - 106 mmol/L 105  109  106   CO2 20 - 29 mmol/L 26  25  22    Calcium 8.7 - 10.3 mg/dL 9.6  9.0  8.9      Lab Results  Component Value Date   TSH 1.730 04/14/2022  11/24 echo-  Left ventricular ejection fraction, by estimation, is 55 to 60%.   Assessment: Encounter Diagnoses  Name Primary?    Gastroesophageal reflux disease with esophagitis without hemorrhage Yes   Esophageal dysphagia  Dysphagia, unspecified     63 year old female patient with intermittent dysphagia, history of GERD with LA grade A esophagitis, S/P Nissen with loose wrap, small HH. Continue pantoprazole  40 mg bid, closely follow antireflux measures. If dysphagia worsens consider repeat barium esophagram, repeat EGD. We reviewed dysphagia precautions. She would like to hold off on any endoscopic procedures for now. UTD on colon screening.     Hx of afib- ablation (Jan) on Eliquis   Plan: -Continue pantoprazole  40 mg twice daily , refilled  - If dysphagia worsens consider repeat barium esophagram, repeat EGD. We discussed today, patient would like to hold off for now. -Screening colonoscopy recall in June 2027  -1 year follow-up with Dr. Albertus   Thank you for the courtesy of this consult. Please call me with any questions or concerns.   Naiomi Musto, FNP-C [facility] 05/26/2024, 9:18 AM  Cc: Stephane Leita DEL, MD

## 2024-06-30 ENCOUNTER — Other Ambulatory Visit: Payer: Self-pay | Admitting: Hematology and Oncology

## 2024-06-30 DIAGNOSIS — Z1231 Encounter for screening mammogram for malignant neoplasm of breast: Secondary | ICD-10-CM

## 2024-07-14 ENCOUNTER — Ambulatory Visit
Admission: RE | Admit: 2024-07-14 | Discharge: 2024-07-14 | Disposition: A | Discharge status: Home / Self Care | Source: Ambulatory Visit | Attending: Hematology and Oncology

## 2024-07-14 DIAGNOSIS — Z1231 Encounter for screening mammogram for malignant neoplasm of breast: Secondary | ICD-10-CM | POA: Diagnosis not present

## 2024-07-29 ENCOUNTER — Ambulatory Visit
Admission: RE | Admit: 2024-07-29 | Discharge: 2024-07-29 | Disposition: A | Discharge status: Home / Self Care | Attending: Family Medicine | Admitting: Family Medicine

## 2024-07-29 ENCOUNTER — Other Ambulatory Visit: Payer: Self-pay

## 2024-07-29 VITALS — BP 154/110 | HR 68 | Temp 98.3°F | Resp 16

## 2024-07-29 DIAGNOSIS — J069 Acute upper respiratory infection, unspecified: Secondary | ICD-10-CM | POA: Diagnosis not present

## 2024-07-29 HISTORY — DX: Unspecified atrial fibrillation: I48.91

## 2024-07-29 MED ORDER — KETOROLAC TROMETHAMINE 30 MG/ML IJ SOLN
30.0000 mg | Freq: Once | INTRAMUSCULAR | Status: AC
  Administered 2024-07-29: 30 mg via INTRAMUSCULAR

## 2024-07-29 MED ORDER — AMOXICILLIN-POT CLAVULANATE 875-125 MG PO TABS: 1.0000 | ORAL_TABLET | Freq: Two times a day (BID) | ORAL | 0 refills | Status: AC

## 2024-07-29 MED ORDER — PREDNISONE 20 MG PO TABS: 40.0000 mg | ORAL_TABLET | Freq: Every day | ORAL | 0 refills | Status: AC

## 2024-07-29 NOTE — Discharge Instructions (Signed)
 Take the prednisone  daily this will help with your congestion and pain Fluids May continue over-the-counter cough and cold medicine Fill and take antibiotic if you do not see improvement over the next couple of days

## 2024-07-29 NOTE — ED Provider Notes (Signed)
 [facility]    CSN: [id number] Arrival date & time: 07/29/24  9047      History   Chief Complaint Chief Complaint  Patient presents with   Cough    HPI Wanda Moore is a 63 y.o. female.   HPI  Patient's had a sore throat and cough over the weekend.  She states woke up this morning with a severe headache.  Feels like pressure and pain in her sinuses.  She states that it is unremitting.  She states that she has some postnasal drip.  Hoarse voice.  Very mild cough.  No underlying allergies.  Not a smoker.  Not usually prone to  Past Medical History:  Diagnosis Date   Allergy    Anxiety    Atrial fibrillation (HCC)    Barrett esophagus 2008   Breast cancer (HCC)    Breast cancer of lower-outer quadrant of left female breast (HCC) 03/15/2015   Cough 08/03/2012   nonproductive   Distal radius fracture, left 12/05/2012   Diverticulosis of colon (without mention of hemorrhage)    Esophageal reflux    Hiatal hernia 2013   large   Melanoma (HCC)    removed from betwen shoulder blades   Personal history of radiation therapy 2016   Recurrent UTI    Stricture and stenosis of esophagus 2013    Patient Active Problem List   Diagnosis Date Noted   Dysphagia, unspecified 05/26/2024   Encounter for monitoring amiodarone  therapy 12/31/2023   Mitral regurgitation 03/09/2022   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   Persistent atrial fibrillation (HCC) 10/07/2021   Rectal bleeding 04/21/2016   Family history of malignant neoplasm of breast 03/18/2015   Melanoma of skin (HCC) 03/18/2015   Breast cancer of lower-outer quadrant of left female breast (HCC) 03/15/2015   Abdominal pain, epigastric 04/16/2014   History of Nissen fundoplication 04/16/2014   Routine health maintenance 05/04/2013   Distal radius fracture, left 12/05/2012   NECK PAIN 01/05/2010   ESOPHAGEAL STRICTURE 04/20/2008   BARRETTS ESOPHAGUS 04/20/2008   ALLERGIC RHINITIS  12/13/2007   GERD 12/13/2007   DIVERTICULOSIS, COLON 12/13/2007   MELANOMA, TRUNK, HX OF 12/13/2007   CHOLECYSTECTOMY, HX OF 07/16/2007    Past Surgical History:  Procedure Laterality Date   ATRIAL FIBRILLATION ABLATION N/A 07/25/2022   Procedure: ATRIAL FIBRILLATION ABLATION;  Surgeon: Cindie Ole DASEN, MD;  [facility];  Service: Cardiovascular;  Laterality: N/A;   ATRIAL FIBRILLATION ABLATION N/A 12/03/2023   Procedure: ATRIAL FIBRILLATION ABLATION;  Surgeon: Cindie Ole DASEN, MD;  [facility];  Service: Cardiovascular;  Laterality: N/A;   BREAST LUMPECTOMY Left 2016   BREAST LUMPECTOMY WITH RADIOACTIVE SEED AND SENTINEL LYMPH NODE BIOPSY Left 03/25/2015   Procedure: LEFT BREAST LUMPECTOMY WITH RADIOACTIVE SEED AND LEFT AXILLARY SENTINEL LYMPH NODE BIOPSY;  Surgeon: Alm Angle, MD;  Location: [facility];  Service: General;  Laterality: Left;   CARDIOVERSION N/A 10/28/2021   Procedure: CARDIOVERSION;  Surgeon: Santo Stanly LABOR, MD;  [facility];  Service: Cardiovascular;  Laterality: N/A;   CESAREAN SECTION  1992   CHOLECYSTECTOMY     COLONOSCOPY  03/25/2007   Diverticulosis    ESOPHAGOGASTRODUODENOSCOPY  04/10/2012   HH, and Stricture   LAPAROSCOPIC NISSEN FUNDOPLICATION  08/09/2012   Procedure: LAPAROSCOPIC NISSEN FUNDOPLICATION;  Surgeon: Donnice KATHEE Lunger, MD;  Location: [facility];  Service: General;  Laterality: N/A;  HIATAL HERNIA REPAIR   MELANOMA EXCISION  back   OPEN REDUCTION INTERNAL FIXATION (ORIF) DISTAL RADIAL FRACTURE  12/05/2012   Procedure: OPEN REDUCTION INTERNAL FIXATION (ORIF) DISTAL RADIAL FRACTURE;  Surgeon: Fonda SHAUNNA Olmsted, MD;  Location: [facility];  Service: Orthopedics;  Laterality: Left;    OB History   No obstetric history on file.      Home Medications    Prior to Admission medications   Medication Sig Start Date End Date Taking? Authorizing Provider   amoxicillin -clavulanate (AUGMENTIN ) 875-125 MG tablet Take 1 tablet by mouth every 12 (twelve) hours. 07/29/24  Yes Maranda Jamee Jacob, MD  predniSONE  (DELTASONE ) 20 MG tablet Take 2 tablets (40 mg total) by mouth daily with breakfast. 07/29/24  Yes Maranda Jamee Jacob, MD  acetaminophen  (TYLENOL ) 500 MG tablet Take 1,000 mg by mouth as needed for moderate pain (pain score 4-6) or mild pain (pain score 1-3).    [provider]  b complex vitamins capsule Take 1 capsule by mouth daily.    [provider]  Calcium Carbonate-Vitamin D (CALCIUM-VITAMIN D) 500-200 MG-UNIT per tablet Take 1 tablet by mouth 2 (two) times daily.    [provider]  Cholecalciferol (VITAMIN D3 MAXIMUM STRENGTH) 125 MCG (5000 UT) capsule Take 5,000 Units by mouth daily.    [provider]  ELIQUIS  5 MG TABS tablet TAKE 1 TABLET BY MOUTH TWICE A DAY 03/03/24   Jeffrie Oneil BROCKS, MD  empagliflozin  (JARDIANCE ) 10 MG TABS tablet TAKE 1 TABLET BY MOUTH DAILY BEFORE BREAKFAST. 09/28/23   Jeffrie Oneil BROCKS, MD  furosemide  (LASIX ) 20 MG tablet Take 1 tablet (20 mg total) by mouth daily as needed (swelling/wt gain). 11/15/23   Jeffrie Oneil BROCKS, MD  metoprolol  succinate (TOPROL -XL) 100 MG 24 hr tablet Take 100mg  in the AM and 50mg  in the PM 12/31/23   Terra Fairy PARAS, PA-C  pantoprazole  (PROTONIX ) 40 MG tablet TAKE 1 TABLET (40 MG TOTAL) BY MOUTH TWICE A DAY BEFORE MEALS 05/26/24   May, Deanna J, NP  potassium chloride  (KLOR-CON  M10) 10 MEQ tablet Take 1 tablet (10 mEq total) by mouth daily as needed (take with as needed  furosemide ). 11/15/23   Jeffrie Oneil BROCKS, MD    Family History Family History  Problem Relation Age of Onset   Hypertension Mother    Diabetes Mother    Heart disease Mother    Lung cancer Father    Other Father        father was adopted - no paternal family history information   Breast cancer Sister    Cancer Sister 56       breast (half sister - same mother)   Diabetes Maternal  Grandmother    Colon cancer Neg Hx    Esophageal cancer Neg Hx    Stomach cancer Neg Hx     Social History Social History   Tobacco Use   Smoking status: Never   Smokeless tobacco: Never   Tobacco comments:    Never smoke 02/15/22  Vaping Use   Vaping status: Never Used  Substance Use Topics   Alcohol  use: Not Currently    Alcohol /week: 2.0 - 3.0 standard drinks of alcohol     Types: 2 - 3 Standard drinks or equivalent per week    Comment: weekends 4-6 10/07/21   Drug use: No     Allergies   Buprenex [buprenorphine] and Pseudoephedrine  hcl   Review of Systems Review of Systems  See HPI Physical Exam Triage Vital Signs ED Triage Vitals  Encounter  Vitals Group     BP 07/29/24 1016 (!) 154/110     Girls Systolic BP Percentile --      Girls Diastolic BP Percentile --      Boys Systolic BP Percentile --      Boys Diastolic BP Percentile --      Pulse Rate 07/29/24 1016 68     Resp 07/29/24 1016 16     Temp 07/29/24 1016 98.3 F (36.8 C)     Temp src --      SpO2 07/29/24 1016 93 %     Weight --      Height --      Head Circumference --      Peak Flow --      Pain Score 07/29/24 1020 7     Pain Loc --      Pain Education --      Exclude from Growth Chart --    No data found.  Updated Vital Signs BP (!) 154/110   Pulse 68   Temp 98.3 F (36.8 C)   Resp 16   SpO2 93%   Repeat blood pressure 144/103    Physical Exam Constitutional:      General: She is not in acute distress.    Appearance: She is well-developed. She is obese. She is ill-appearing.  HENT:     Head: Normocephalic and atraumatic.     Right Ear: Tympanic membrane normal.     Left Ear: Tympanic membrane normal.     Nose: Congestion and rhinorrhea present.     Mouth/Throat:     Mouth: Mucous membranes are moist.     Pharynx: No posterior oropharyngeal erythema.     Comments: Facial sinuses tender Eyes:     Conjunctiva/sclera: Conjunctivae normal.     Pupils: Pupils are equal, round,  and reactive to light.  Cardiovascular:     Rate and Rhythm: Normal rate and regular rhythm.     Heart sounds: Normal heart sounds.  Pulmonary:     Effort: Pulmonary effort is normal. No respiratory distress.     Breath sounds: Normal breath sounds.  Abdominal:     Palpations: Abdomen is soft.  Musculoskeletal:        General: Normal range of motion.     Cervical back: Normal range of motion.  Lymphadenopathy:     Cervical: Cervical adenopathy present.  Skin:    General: Skin is warm and dry.  Neurological:     Mental Status: She is alert.      UC Treatments / Results  Labs (all labs ordered are listed, but only abnormal results are displayed) Labs Reviewed - No data to display  EKG   Radiology No results found.  Procedures Procedures (including critical care time)  Medications Ordered in UC Medications  ketorolac  (TORADOL ) 30 MG/ML injection 30 mg (30 mg Intramuscular Given 07/29/24 1053)    Initial Impression / Assessment and Plan / UC Course  I have reviewed the triage vital signs and the nursing notes.  Pertinent labs & imaging results that were available during my care of the patient were reviewed by me and considered in my medical decision making (see chart for details).     I explained that these infections are usually viral.  Will give her prednisone  for the sinus congestion and pain.  She is given a shot of Toradol .  Patient knows she can take Augmentin  if she fails to improve over the next 72 hours.  Return as  needed Final Clinical Impressions(s) / UC Diagnoses   Final diagnoses:  Viral URI with cough     Discharge Instructions      Take the prednisone  daily this will help with your congestion and pain Fluids May continue over-the-counter cough and cold medicine Fill and take antibiotic if you do not see improvement over the next couple of days     ED Prescriptions     Medication Sig Dispense Auth. Provider   predniSONE  (DELTASONE ) 20 MG  tablet Take 2 tablets (40 mg total) by mouth daily with breakfast. 10 tablet Maranda Jamee Jacob, MD   amoxicillin -clavulanate (AUGMENTIN ) 875-125 MG tablet Take 1 tablet by mouth every 12 (twelve) hours. 14 tablet Maranda Jamee Jacob, MD      PDMP not reviewed this encounter.   Maranda Jamee Jacob, MD 07/29/24 [phone]

## 2024-07-29 NOTE — ED Triage Notes (Addendum)
 Sick since last Friday, started with tickle in throat. Saturday had sore throat, could barely talk. Sunday had lightheadedness. Yesterday still had sore throat. This morning woke up with splitting headache, cough. Unsure of fever. Cough is non productive. Has had nyquil and dayquil, which help some. Took covid test yesterday and was negative.

## 2024-08-04 ENCOUNTER — Other Ambulatory Visit: Payer: Self-pay | Admitting: Cardiology

## 2024-08-04 ENCOUNTER — Other Ambulatory Visit: Payer: Self-pay | Admitting: Physician Assistant

## 2024-09-22 DIAGNOSIS — L720 Epidermal cyst: Secondary | ICD-10-CM | POA: Diagnosis not present

## 2024-09-22 DIAGNOSIS — S00501A Unspecified superficial injury of lip, initial encounter: Secondary | ICD-10-CM | POA: Diagnosis not present

## 2024-09-26 ENCOUNTER — Telehealth: Payer: Self-pay | Admitting: Cardiology

## 2024-09-26 DIAGNOSIS — I4819 Other persistent atrial fibrillation: Secondary | ICD-10-CM

## 2024-09-26 NOTE — Telephone Encounter (Signed)
 1. Which medications need to be refilled? (please list name of each medication and dose if known) ELIQUIS  5 MG TABS tablet      2. Would you like to learn more about the convenience, safety, & potential cost savings by using the [facility]? no     3. Are you open to using the [facility] (Type [facility]. no     4. Which pharmacy/location (including street and city if local pharmacy) is medication to be sent to? [id number] - [place], [place] - [address]    5. Do they need a 30 day or 90 day supply? 90 days

## 2024-09-30 MED ORDER — APIXABAN 5 MG PO TABS: 5.0000 mg | ORAL_TABLET | Freq: Two times a day (BID) | ORAL | 5 refills | Status: AC

## 2024-09-30 NOTE — Telephone Encounter (Signed)
 Prescription refill request for Eliquis  received. Indication: a-fib Last office visit: 08/04/2024 Scr: 0.73 (EPIC 11/05/2023) Age: 63 Weight: 112.9kg  Dose appropriate, refill sent

## 2024-10-22 NOTE — Progress Notes (Unsigned)
°  Electrophysiology Office Follow up Visit Note:    Date:  10/23/2024   ID:  Wanda Moore, DOB [birth year], MRN [id number]  PCP:  Stephane Leita DEL, MD  [facility] Cardiologist:  Oneil Parchment, MD  [facility] Electrophysiologist:  OLE ONEIDA HOLTS, MD    Interval History:     Wanda Moore is a 63 y.o. female who presents for a follow up visit.   The patient saw Wanda Moore in follow-up March 03, 2024.  The patient has a history of atrial fibrillation and had a prior catheter ablation in September 2023 with a redo in January 2025.  Her amiodarone  was stopped at the appointment with Brandi.  She is on Eliquis  for stroke prophylaxis.  Today she is doing well.  Reports good control of her heart rhythm without recurrence.  She confirms she is no longer taking amiodarone  since April.  She does report intermittent swelling in the left greater than right lower leg which is worse throughout the day and gets better by morning.  She was prescribed Lasix  and this helped with the symptoms.       Past medical, surgical, social and family history were reviewed.  ROS:   Please see the history of present illness.    All other systems reviewed and are negative.  EKGs/Labs/Other Studies Reviewed:    The following studies were reviewed today:     EKG Interpretation Date/Time:  Thursday October 23 2024 15:22:12 EST Ventricular Rate:  61 PR Interval:  166 QRS Duration:  84 QT Interval:  450 QTC Calculation: 453 R Axis:   11  Text Interpretation: Normal sinus rhythm Normal ECG Confirmed by Holts Ole [phone]) on 10/23/2024 3:22:48 PM    Physical Exam:    VS:  There were no vitals taken for this visit.    Wt Readings from Last 3 Encounters:  05/26/24 249 lb (112.9 kg)  03/03/24 239 lb (108.4 kg)  12/31/23 232 lb 9.6 oz (105.5 kg)     GEN: no distress CARD: RRR, No MRG RESP: No IWOB. CTAB.      ASSESSMENT:    1. Persistent atrial fibrillation (HCC)     PLAN:    In order of problems listed above:  #Persistent atrial fibrillation 2 prior catheter ablations.  Amiodarone  was stopped in April.  Is maintaining sinus rhythm. Continue Eliquis   #Lower extremity swelling Appears to be venous insufficiency given worse throughout the day and better in the morning.  I encouraged her to try compression stockings.  She was already prescribed Lasix  which she uses for more significant symptoms.  I think this is a reasonable strategy.  I discussed my upcoming departure from [facility] during today's clinic appointment.  The patient will continue to follow-up with one of my EP partners moving forward.  Follow-up 1 year with EP APP  Signed, Ole Holts, MD, [facility], [facility] 10/23/2024 3:24 PM    Electrophysiology [facility]

## 2024-10-23 ENCOUNTER — Ambulatory Visit: Admitting: Cardiology

## 2024-10-23 ENCOUNTER — Encounter: Payer: Self-pay | Admitting: Cardiology

## 2024-10-23 VITALS — BP 136/80 | HR 61 | Ht 72.0 in | Wt 253.0 lb

## 2024-10-23 DIAGNOSIS — I428 Other cardiomyopathies: Secondary | ICD-10-CM

## 2024-10-23 DIAGNOSIS — I4819 Other persistent atrial fibrillation: Secondary | ICD-10-CM

## 2024-10-23 DIAGNOSIS — I5022 Chronic systolic (congestive) heart failure: Secondary | ICD-10-CM

## 2024-10-23 NOTE — Patient Instructions (Signed)

## 2024-11-01 ENCOUNTER — Other Ambulatory Visit: Payer: Self-pay | Admitting: Cardiology

## 2024-11-03 MED ORDER — POTASSIUM CHLORIDE CRYS ER 10 MEQ PO TBCR: 10.0000 meq | EXTENDED_RELEASE_TABLET | Freq: Every day | ORAL | 0 refills | Status: AC | PRN

## 2024-11-03 MED ORDER — FUROSEMIDE 20 MG PO TABS: 20.0000 mg | ORAL_TABLET | Freq: Every day | ORAL | 0 refills | Status: AC | PRN

## 2024-11-03 NOTE — Addendum Note (Signed)
 Addended by: BLUFORD, Naliah Eddington L on: 11/03/2024 03:26 PM   Modules accepted: Orders

## 2024-11-05 ENCOUNTER — Other Ambulatory Visit: Payer: Self-pay | Admitting: Cardiology

## 2024-12-04 ENCOUNTER — Other Ambulatory Visit: Payer: Self-pay | Admitting: Cardiology

## 2024-12-05 ENCOUNTER — Encounter: Payer: Self-pay | Admitting: Cardiology
